# Patient Record
Sex: Female | Born: 1940 | Race: White | Hispanic: No | Smoking: Former smoker
Health system: Southern US, Community
[De-identification: ages and names within clinical notes are randomized; demographics above are authoritative.]

## PROBLEM LIST (undated history)

## (undated) DIAGNOSIS — G454 Transient global amnesia: Secondary | ICD-10-CM

## (undated) DIAGNOSIS — G43909 Migraine, unspecified, not intractable, without status migrainosus: Secondary | ICD-10-CM

## (undated) DIAGNOSIS — S83289A Other tear of lateral meniscus, current injury, unspecified knee, initial encounter: Secondary | ICD-10-CM

## (undated) DIAGNOSIS — M1990 Unspecified osteoarthritis, unspecified site: Secondary | ICD-10-CM

## (undated) DIAGNOSIS — Z87891 Personal history of nicotine dependence: Secondary | ICD-10-CM

## (undated) DIAGNOSIS — R7309 Other abnormal glucose: Secondary | ICD-10-CM

## (undated) DIAGNOSIS — E079 Disorder of thyroid, unspecified: Secondary | ICD-10-CM

## (undated) DIAGNOSIS — K5792 Diverticulitis of intestine, part unspecified, without perforation or abscess without bleeding: Secondary | ICD-10-CM

## (undated) DIAGNOSIS — M8580 Other specified disorders of bone density and structure, unspecified site: Secondary | ICD-10-CM

## (undated) DIAGNOSIS — I10 Essential (primary) hypertension: Secondary | ICD-10-CM

## (undated) HISTORY — PX: DILATION AND CURETTAGE OF UTERUS: SHX78

## (undated) HISTORY — DX: Personal history of nicotine dependence: Z87.891

## (undated) HISTORY — DX: Other abnormal glucose: R73.09

## (undated) HISTORY — DX: Diverticulitis of intestine, part unspecified, without perforation or abscess without bleeding: K57.92

## (undated) HISTORY — DX: Transient global amnesia: G45.4

## (undated) HISTORY — DX: Migraine, unspecified, not intractable, without status migrainosus: G43.909

## (undated) HISTORY — DX: Other tear of lateral meniscus, current injury, unspecified knee, initial encounter: S83.289A

## (undated) HISTORY — DX: Unspecified osteoarthritis, unspecified site: M19.90

## (undated) HISTORY — DX: Other specified disorders of bone density and structure, unspecified site: M85.80

## (undated) HISTORY — PX: TUBAL LIGATION: SHX77

---

## 1998-08-27 ENCOUNTER — Other Ambulatory Visit: Admission: RE | Admit: 1998-08-27 | Discharge: 1998-08-27 | Payer: Self-pay | Admitting: *Deleted

## 1999-09-27 ENCOUNTER — Encounter: Payer: Self-pay | Admitting: Family Medicine

## 1999-09-27 ENCOUNTER — Encounter: Admission: RE | Admit: 1999-09-27 | Discharge: 1999-09-27 | Payer: Self-pay | Admitting: Family Medicine

## 1999-10-04 ENCOUNTER — Encounter: Payer: Self-pay | Admitting: Obstetrics & Gynecology

## 1999-10-04 ENCOUNTER — Encounter: Admission: RE | Admit: 1999-10-04 | Discharge: 1999-10-04 | Payer: Self-pay | Admitting: Obstetrics & Gynecology

## 1999-10-13 ENCOUNTER — Other Ambulatory Visit: Admission: RE | Admit: 1999-10-13 | Discharge: 1999-10-13 | Payer: Self-pay | Admitting: *Deleted

## 1999-10-17 ENCOUNTER — Other Ambulatory Visit: Admission: RE | Admit: 1999-10-17 | Discharge: 1999-10-17 | Payer: Self-pay | Admitting: *Deleted

## 2000-03-28 ENCOUNTER — Encounter: Admission: RE | Admit: 2000-03-28 | Discharge: 2000-03-28 | Payer: Self-pay | Admitting: Family Medicine

## 2000-03-28 ENCOUNTER — Encounter: Payer: Self-pay | Admitting: Family Medicine

## 2000-06-14 ENCOUNTER — Ambulatory Visit (HOSPITAL_COMMUNITY): Admission: RE | Admit: 2000-06-14 | Discharge: 2000-06-14 | Payer: Self-pay | Admitting: Gastroenterology

## 2000-10-04 ENCOUNTER — Encounter: Admission: RE | Admit: 2000-10-04 | Discharge: 2000-10-04 | Payer: Self-pay | Admitting: Family Medicine

## 2000-10-04 ENCOUNTER — Encounter: Payer: Self-pay | Admitting: Family Medicine

## 2001-02-12 ENCOUNTER — Other Ambulatory Visit: Admission: RE | Admit: 2001-02-12 | Discharge: 2001-02-12 | Payer: Self-pay | Admitting: Obstetrics and Gynecology

## 2001-10-17 ENCOUNTER — Encounter: Payer: Self-pay | Admitting: Family Medicine

## 2001-10-17 ENCOUNTER — Encounter: Admission: RE | Admit: 2001-10-17 | Discharge: 2001-10-17 | Payer: Self-pay | Admitting: Family Medicine

## 2001-11-05 ENCOUNTER — Encounter: Admission: RE | Admit: 2001-11-05 | Discharge: 2001-11-21 | Payer: Self-pay | Admitting: Family Medicine

## 2001-12-09 ENCOUNTER — Encounter: Payer: Self-pay | Admitting: Family Medicine

## 2001-12-09 ENCOUNTER — Encounter: Admission: RE | Admit: 2001-12-09 | Discharge: 2001-12-09 | Payer: Self-pay | Admitting: Family Medicine

## 2002-12-02 ENCOUNTER — Encounter: Admission: RE | Admit: 2002-12-02 | Discharge: 2002-12-02 | Payer: Self-pay | Admitting: Family Medicine

## 2003-01-07 ENCOUNTER — Encounter: Admission: RE | Admit: 2003-01-07 | Discharge: 2003-01-07 | Payer: Self-pay | Admitting: Family Medicine

## 2003-01-29 ENCOUNTER — Emergency Department (HOSPITAL_COMMUNITY): Admission: AD | Admit: 2003-01-29 | Discharge: 2003-01-29 | Payer: Self-pay | Admitting: Family Medicine

## 2003-02-27 ENCOUNTER — Other Ambulatory Visit: Admission: RE | Admit: 2003-02-27 | Discharge: 2003-02-27 | Payer: Self-pay | Admitting: Obstetrics and Gynecology

## 2003-09-11 ENCOUNTER — Emergency Department (HOSPITAL_COMMUNITY): Admission: EM | Admit: 2003-09-11 | Discharge: 2003-09-11 | Payer: Self-pay | Admitting: Family Medicine

## 2003-12-07 ENCOUNTER — Encounter: Admission: RE | Admit: 2003-12-07 | Discharge: 2003-12-07 | Payer: Self-pay | Admitting: Family Medicine

## 2004-04-11 ENCOUNTER — Other Ambulatory Visit: Admission: RE | Admit: 2004-04-11 | Discharge: 2004-04-11 | Payer: Self-pay | Admitting: Obstetrics and Gynecology

## 2004-07-17 ENCOUNTER — Emergency Department (HOSPITAL_COMMUNITY): Admission: AD | Admit: 2004-07-17 | Discharge: 2004-07-17 | Payer: Self-pay | Admitting: Emergency Medicine

## 2004-09-07 ENCOUNTER — Ambulatory Visit (HOSPITAL_COMMUNITY): Admission: RE | Admit: 2004-09-07 | Discharge: 2004-09-07 | Payer: Self-pay | Admitting: Orthopedic Surgery

## 2004-11-06 HISTORY — PX: KNEE ARTHROSCOPY: SHX127

## 2004-11-17 ENCOUNTER — Ambulatory Visit (HOSPITAL_COMMUNITY): Admission: RE | Admit: 2004-11-17 | Discharge: 2004-11-17 | Payer: Self-pay | Admitting: Orthopedic Surgery

## 2004-11-17 ENCOUNTER — Ambulatory Visit (HOSPITAL_BASED_OUTPATIENT_CLINIC_OR_DEPARTMENT_OTHER): Admission: RE | Admit: 2004-11-17 | Discharge: 2004-11-17 | Payer: Self-pay | Admitting: Orthopedic Surgery

## 2005-01-23 ENCOUNTER — Encounter: Admission: RE | Admit: 2005-01-23 | Discharge: 2005-01-23 | Payer: Self-pay | Admitting: Family Medicine

## 2005-01-24 ENCOUNTER — Encounter: Admission: RE | Admit: 2005-01-24 | Discharge: 2005-02-07 | Payer: Self-pay | Admitting: Orthopedic Surgery

## 2005-02-22 ENCOUNTER — Emergency Department (HOSPITAL_COMMUNITY): Admission: EM | Admit: 2005-02-22 | Discharge: 2005-02-22 | Payer: Self-pay | Admitting: Family Medicine

## 2005-03-11 ENCOUNTER — Emergency Department (HOSPITAL_COMMUNITY): Admission: EM | Admit: 2005-03-11 | Discharge: 2005-03-11 | Payer: Self-pay | Admitting: Emergency Medicine

## 2005-03-27 ENCOUNTER — Encounter (HOSPITAL_COMMUNITY): Admission: RE | Admit: 2005-03-27 | Discharge: 2005-06-25 | Payer: Self-pay | Admitting: Neurology

## 2005-04-13 ENCOUNTER — Other Ambulatory Visit: Admission: RE | Admit: 2005-04-13 | Discharge: 2005-04-13 | Payer: Self-pay | Admitting: Obstetrics and Gynecology

## 2005-09-28 ENCOUNTER — Emergency Department (HOSPITAL_COMMUNITY): Admission: EM | Admit: 2005-09-28 | Discharge: 2005-09-28 | Payer: Self-pay | Admitting: Family Medicine

## 2006-02-06 ENCOUNTER — Emergency Department (HOSPITAL_COMMUNITY): Admission: EM | Admit: 2006-02-06 | Discharge: 2006-02-06 | Payer: Self-pay | Admitting: Emergency Medicine

## 2006-07-30 ENCOUNTER — Other Ambulatory Visit: Admission: RE | Admit: 2006-07-30 | Discharge: 2006-07-30 | Payer: Self-pay | Admitting: Obstetrics & Gynecology

## 2006-08-08 ENCOUNTER — Encounter: Admission: RE | Admit: 2006-08-08 | Discharge: 2006-08-08 | Payer: Self-pay | Admitting: Obstetrics and Gynecology

## 2007-08-06 ENCOUNTER — Other Ambulatory Visit: Admission: RE | Admit: 2007-08-06 | Discharge: 2007-08-06 | Payer: Self-pay | Admitting: Obstetrics & Gynecology

## 2007-08-21 ENCOUNTER — Encounter: Admission: RE | Admit: 2007-08-21 | Discharge: 2007-08-21 | Payer: Self-pay | Admitting: Family Medicine

## 2008-09-10 ENCOUNTER — Encounter: Admission: RE | Admit: 2008-09-10 | Discharge: 2008-09-10 | Payer: Self-pay | Admitting: Family Medicine

## 2009-04-17 ENCOUNTER — Emergency Department (HOSPITAL_COMMUNITY): Admission: EM | Admit: 2009-04-17 | Discharge: 2009-04-17 | Payer: Self-pay | Admitting: Family Medicine

## 2009-09-28 ENCOUNTER — Encounter: Admission: RE | Admit: 2009-09-28 | Discharge: 2009-09-28 | Payer: Self-pay | Admitting: Family Medicine

## 2010-06-24 NOTE — Procedures (Signed)
EEG NUMBER:  [id number]   REFERRING PHYSICIAN:  Santina Evans A. Orlin Hilding, M.D.   CLINICAL HISTORY:  [age]-year-old lady being evaluated for episode of  global amnesia.   MEDICATIONS:  Medications are not listed.   TECHNICAL DESCRIPTION:  This is a routine 17-channel EEG recorded with the  patient in wakeful and drowsy states.   Background awake rhythm consists of 11- to 12-Hz alpha, which is of moderate  amplitude, synchronous, reactive to eye-opening and closure.  No paroxysmal  epileptiform activity, spikes or sharp waves are seen.  Hyperventilation and  photic stimulation are unremarkable.  Mild drowsiness changes are noted  along with a minimum amount of light sleep which does not show any  abnormalities.  Technical component of study is adequate.  EKG tracing  reveals regular sinus rhythm.  Length of the tracing is 25.6 minutes.   IMPRESSION:  This EEG performed during wakeful states and light sleep is  within normal limits.  No definite epileptiform activity is identified.           ______________________________  Sunny Schlein. Pearlean Brownie, MD     EAV:[id number]  D:  03/27/2005 18:54:00  T:  03/28/2005 10:37:36  Job #:  [id number]   cc:   Santina Evans A. Orlin Hilding, M.D.  Fax: [phone]

## 2010-06-24 NOTE — Op Note (Signed)
[name], [name]              ACCOUNT NO.:  [id number]   MEDICAL RECORD NO.:  [id number]          PATIENT TYPE:  AMB   LOCATION:  [facility]                         FACILITY:  [facility]   PHYSICIAN:  Ollen Gross, M.D.    DATE OF BIRTH:  04/08/1940   DATE OF PROCEDURE:  11/17/2004  DATE OF DISCHARGE:                                 OPERATIVE REPORT   PREOPERATIVE DIAGNOSIS:  Right knee medial meniscal tear and chondral  defect.   POSTOPERATIVE DIAGNOSIS:  Right knee medial meniscal tear and chondral  defect.   PROCEDURE:  Right knee arthroscopy with debridement and chondroplasty medial  femoral condyle and trochlea.   SURGEON:  Ollen Gross, M.D.   ANESTHESIA:  Local with MAC.   ESTIMATED BLOOD LOSS:  Minimal.   DRAINS:  None.   COMPLICATIONS:  Stable to recovery.   CLINICAL NOTE:  [name] is a 70 year old female with significant medial  sided pain, right knee with mechanical symptoms.  Exam and history suggest a  meniscal tear, confirmed by MRI.  She presents now for arthroscopy and  debridement.   PROCEDURE IN DETAIL:  After successful administration of MAC anesthetic, a  tourniquet was placed high on the right thigh.  The right lower extremity  was prepped and draped in the usual sterile fashion. Standard superomedial  and inferolateral cannula sites were marked. The superomedial was not  injected preoperatively thus I injected with 10 cc of 1% Xylocaine. O then  made the incision through the superomedial and inferolateral portals.  Inflow cannula was passed superomedial and camera passed inferolateral.  Arthroscopic visualization proceeds.  Undersurface of patella looks normal.  The trochlea centrally has about a 1 x 1 cm defect with actively  delaminating cartilage.  The rest of the trochlea looks normal.  Medial and  lateral gutters were visualized.  There is a small loose body in the medial  gutter. The spinal needle is used to localize the inferomedial portals.   A  small  incision made and the dilator placed and the shaver passed to remove  that small loose body. Flexion and valgus force was then applied and the  knee medial compartment was entered.  She has evidence of degenerative tear  in the body and posterior horn in the medial meniscus which appears to be  unstable.  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 It is an area about 2 x 2 cm. Baskets and 4.2  mm shaver used debride the meniscus back to stable base.  A shaver was also  used to perform chondroplasty and smooth out the fraying cartilage from the  medial femoral condyle.  Again, the area was about 2 x 2 cm.  This tiny  focal area where it is  debrided all the back to bone with the most part  there is still a cartilaginous cover.  The intercondylar notch was  visualized.  The ACL appears normal.  Lateral compartment centered and is  normal.  The area on the trochlea which is about 1 x 1 cm and appears to  have chondral delamination indeed does have delamination, and the defect is  debrided back to a stable bony base with stable cartilaginous edges.  It is  probed and found to be stable.  The arthroscopic equipment was subsequently  removed from the inferior portals which were closed with interrupted 4-0  nylon and 20 cc of  0.25% Marcaine with epinephrine injected through the inflow cannula and that  is removed and that portal closed with nylon.  Bulky sterile dressing  applied, and she is subsequently awakened and transferred to recovery in  stable condition.      Ollen Gross, M.D.  Electronically Signed     FA/MEDQ  D:  11/17/2004  T:  11/17/2004  Job:  [id number]

## 2010-07-15 ENCOUNTER — Ambulatory Visit (HOSPITAL_COMMUNITY)
Admission: RE | Admit: 2010-07-15 | Discharge: 2010-07-15 | Disposition: A | Discharge status: Home / Self Care | Payer: 59 | Source: Ambulatory Visit | Attending: Gastroenterology | Admitting: Gastroenterology

## 2010-07-15 DIAGNOSIS — K5730 Diverticulosis of large intestine without perforation or abscess without bleeding: Secondary | ICD-10-CM | POA: Insufficient documentation

## 2010-07-15 DIAGNOSIS — E039 Hypothyroidism, unspecified: Secondary | ICD-10-CM | POA: Insufficient documentation

## 2010-07-15 DIAGNOSIS — Z7982 Long term (current) use of aspirin: Secondary | ICD-10-CM | POA: Insufficient documentation

## 2010-07-15 DIAGNOSIS — K625 Hemorrhage of anus and rectum: Secondary | ICD-10-CM | POA: Insufficient documentation

## 2010-07-15 DIAGNOSIS — I10 Essential (primary) hypertension: Secondary | ICD-10-CM | POA: Insufficient documentation

## 2010-07-15 DIAGNOSIS — K648 Other hemorrhoids: Secondary | ICD-10-CM | POA: Insufficient documentation

## 2010-07-15 DIAGNOSIS — Z79899 Other long term (current) drug therapy: Secondary | ICD-10-CM | POA: Insufficient documentation

## 2010-07-15 LAB — HM COLONOSCOPY: HM Colonoscopy: NORMAL

## 2010-10-05 ENCOUNTER — Other Ambulatory Visit: Payer: Self-pay | Admitting: Family Medicine

## 2010-10-05 DIAGNOSIS — Z1231 Encounter for screening mammogram for malignant neoplasm of breast: Secondary | ICD-10-CM

## 2010-10-18 ENCOUNTER — Ambulatory Visit
Admission: RE | Admit: 2010-10-18 | Discharge: 2010-10-18 | Disposition: A | Discharge status: Home / Self Care | Payer: 59 | Source: Ambulatory Visit | Attending: Family Medicine | Admitting: Family Medicine

## 2010-10-18 DIAGNOSIS — Z1231 Encounter for screening mammogram for malignant neoplasm of breast: Secondary | ICD-10-CM

## 2011-04-05 ENCOUNTER — Encounter (HOSPITAL_COMMUNITY): Payer: Self-pay

## 2011-04-05 ENCOUNTER — Emergency Department (INDEPENDENT_AMBULATORY_CARE_PROVIDER_SITE_OTHER)
Admission: EM | Admit: 2011-04-05 | Discharge: 2011-04-05 | Disposition: A | Discharge status: Home Health | Payer: 59 | Source: Home / Self Care | Attending: Family Medicine | Admitting: Family Medicine

## 2011-04-05 DIAGNOSIS — R05 Cough: Secondary | ICD-10-CM

## 2011-04-05 DIAGNOSIS — R059 Cough, unspecified: Secondary | ICD-10-CM

## 2011-04-05 HISTORY — DX: Disorder of thyroid, unspecified: E07.9

## 2011-04-05 HISTORY — DX: Essential (primary) hypertension: I10

## 2011-04-05 MED ORDER — HYDROCOD POLST-CHLORPHEN POLST 10-8 MG/5ML PO LQCR: 5.0000 mL | Freq: Two times a day (BID) | ORAL | Status: DC | PRN

## 2011-04-05 MED ORDER — PREDNISONE (PAK) 10 MG PO TABS: ORAL_TABLET | ORAL | Status: AC

## 2011-04-05 NOTE — Discharge Instructions (Signed)
Take medications as directed. Use the cough syrup as directed. Return to care should your symptoms not improve, or worsen in any way.

## 2011-04-05 NOTE — ED Notes (Signed)
Pt c/o cough since early January.  Pt states onset after using Kaboom aerosol spray while cleaning.  Pt states cough has been productive, clear mucous.  Denies fever.

## 2011-04-05 NOTE — ED Provider Notes (Signed)
History     CSN: [id number]  Arrival date & time 04/05/11  1544   First MD Initiated Contact with Patient 04/05/11 1608      Chief Complaint  Patient presents with  . Cough    (Consider location/radiation/quality/duration/timing/severity/associated sxs/prior treatment) HPI Comments: [name] presents for evaluation of persistent cough since first week of January. She reports at that time. She was using a household cleaner in a poorly ventilated area. She reported nonproductive cough since that time. She denies any fever, shortness of breath or other URI symptoms.  Patient is a 71 y.o. female presenting with cough. The history is provided by the patient.  Cough This is a new problem. The current episode started more than 1 week ago. The problem occurs constantly. The problem has not changed since onset.The cough is non-productive. There has been no fever. Pertinent negatives include no rhinorrhea, no shortness of breath and no wheezing. She has tried cough syrup for the symptoms. The treatment provided no relief. She is not a smoker.    Past Medical History  Diagnosis Date  . Hypertension   . Thyroid disease     History reviewed. No pertinent past surgical history.  History reviewed. No pertinent family history.  History  Substance Use Topics  . Smoking status: Not on file  . Smokeless tobacco: Not on file  . Alcohol Use: No    OB History    Grav Para Term Preterm Abortions TAB SAB Ect Mult Living                  Review of Systems  Constitutional: Negative.   HENT: Negative.  Negative for rhinorrhea.   Eyes: Negative.   Respiratory: Positive for cough. Negative for shortness of breath and wheezing.   Cardiovascular: Negative.   Gastrointestinal: Negative.   Genitourinary: Negative.   Musculoskeletal: Negative.   Skin: Negative.   Neurological: Negative.     Allergies  Hydrocodone and Sulfa antibiotics  Home Medications   Current Outpatient Rx  Name Route  Sig Dispense Refill  . ASPIRIN 325 MG PO TBEC Oral Take 325 mg by mouth daily.    Marland Kitchen BENAZEPRIL-HYDROCHLOROTHIAZIDE 10-12.5 MG PO TABS Oral Take 1 tablet by mouth daily.    Marland Kitchen LEVOTHYROXINE SODIUM 25 MCG PO TABS Oral Take 25 mcg by mouth daily.    Marland Kitchen PROMETHAZINE HCL 25 MG PO TABS Oral Take 25 mg by mouth every 6 (six) hours as needed.    Marland Kitchen RIZATRIPTAN BENZOATE 10 MG PO TABS Oral Take 10 mg by mouth as needed. May repeat in 2 hours if needed    . HYDROCOD POLST-CPM POLST ER 10-8 MG/5ML PO LQCR Oral Take 5 mLs by mouth every 12 (twelve) hours as needed. 140 mL 0  . PREDNISONE (PAK) 10 MG PO TABS  Take 6 tablets on day 1, 5 tablets on day 2, 4 tablets on day 3, 3 tablets on day 4, 2 tablets on day 5, 1 tablet on day 6 21 tablet 0    BP 118/70  Pulse 86  Temp(Src) 99.2 F (37.3 C) (Oral)  Resp 16  SpO2 96%  Physical Exam  Nursing note and vitals reviewed. Constitutional: She is oriented to person, place, and time. She appears well-developed and well-nourished.  HENT:  Head: Normocephalic and atraumatic.  Right Ear: Tympanic membrane normal.  Left Ear: Tympanic membrane normal.  Eyes: EOM are normal.  Neck: Normal range of motion.  Pulmonary/Chest: Effort normal and breath sounds normal. She has no  decreased breath sounds. She has no wheezes. She has no rhonchi. She has no rales.  Musculoskeletal: Normal range of motion.  Neurological: She is alert and oriented to person, place, and time.  Skin: Skin is warm and dry.  Psychiatric: Her behavior is normal.    ED Course  Procedures (including critical care time)  Labs Reviewed - No data to display No results found.   1. Cough       MDM  Exam unremarkable; unclear etiology of persistent cough; will prescribe prednisone dosepak; Tussionex Pennkinetic syrup; return if symptoms persist or worsen        Richardo Priest, MD 04/05/11 1659

## 2011-09-14 ENCOUNTER — Other Ambulatory Visit: Payer: Self-pay | Admitting: Family Medicine

## 2011-09-14 DIAGNOSIS — Z1231 Encounter for screening mammogram for malignant neoplasm of breast: Secondary | ICD-10-CM

## 2011-10-31 ENCOUNTER — Ambulatory Visit: Payer: 59

## 2011-10-31 ENCOUNTER — Ambulatory Visit
Admission: RE | Admit: 2011-10-31 | Discharge: 2011-10-31 | Disposition: A | Discharge status: Home / Self Care | Payer: 59 | Source: Ambulatory Visit | Attending: Family Medicine | Admitting: Family Medicine

## 2011-10-31 DIAGNOSIS — Z1231 Encounter for screening mammogram for malignant neoplasm of breast: Secondary | ICD-10-CM

## 2012-09-30 ENCOUNTER — Other Ambulatory Visit: Payer: Self-pay | Admitting: Nurse Practitioner

## 2012-10-01 ENCOUNTER — Other Ambulatory Visit: Payer: Self-pay | Admitting: Nurse Practitioner

## 2012-10-01 NOTE — Telephone Encounter (Signed)
Patient is not quite due for AEX just yet last AEX 01/01/12 # 24/0rf's sent through to last pt until 11/14

## 2012-10-02 ENCOUNTER — Other Ambulatory Visit: Payer: Self-pay | Admitting: Nurse Practitioner

## 2012-12-20 ENCOUNTER — Other Ambulatory Visit: Payer: Self-pay

## 2012-12-20 DIAGNOSIS — Z1231 Encounter for screening mammogram for malignant neoplasm of breast: Secondary | ICD-10-CM

## 2013-01-01 ENCOUNTER — Other Ambulatory Visit: Payer: Self-pay | Admitting: Nurse Practitioner

## 2013-01-01 NOTE — Telephone Encounter (Signed)
eScribe request for refill on VITAMIN D 78295 Last filled - 01/01/12 Last AEX - 01/01/12 Next AEX - not scheduled Last Vitamin D - 02/08/12 = 42 I spoke to patient.  She is scheduled for 12/8 for AEX.  RX sent.

## 2013-01-13 ENCOUNTER — Ambulatory Visit (INDEPENDENT_AMBULATORY_CARE_PROVIDER_SITE_OTHER): Payer: Medicare Other | Admitting: Nurse Practitioner

## 2013-01-13 ENCOUNTER — Encounter: Payer: Self-pay | Admitting: Nurse Practitioner

## 2013-01-13 VITALS — BP 130/64 | HR 64 | Resp 16 | Ht 64.75 in | Wt 148.0 lb

## 2013-01-13 DIAGNOSIS — Z01419 Encounter for gynecological examination (general) (routine) without abnormal findings: Secondary | ICD-10-CM

## 2013-01-13 DIAGNOSIS — E559 Vitamin D deficiency, unspecified: Secondary | ICD-10-CM

## 2013-01-13 MED ORDER — VITAMIN D (ERGOCALCIFEROL) 1.25 MG (50000 UNIT) PO CAPS: ORAL_CAPSULE | ORAL | Status: DC

## 2013-01-13 NOTE — Patient Instructions (Addendum)

## 2013-01-13 NOTE — Progress Notes (Signed)
Patient ID: [name], female   DOB: 1940-10-01, 72 y.o.   MRN: [id number] 72 y.o. G0P0 Single Caucasian Fe here for annual exam.  Now retired since January 2014 and very busy.   No new health issues.  No LMP recorded. Patient is postmenopausal.          Sexually active: no  The current method of family planning is post menopausal status.    Exercising: yes  walking, stretching Smoker:  former  Health Maintenance: Pap:  12/02/09, WNL MMG:  11/01/11, Bi-Rads 1: negative Colonoscopy:  07/15/10, normal, repeat in 10 years BMD:   2008, "normal" TDaP:  2007 Labs: PCP   reports that she has quit smoking. She has never used smokeless tobacco. She reports that she drinks alcohol. She reports that she does not use illicit drugs.  Past Medical History  Diagnosis Date  . Hypertension   . TGA (transient global amnesia)     secondary to [place]  . Former smoker   . Diverticulitis   . Migraines   . Osteoarthritis     Past Surgical History  Procedure Laterality Date  . Tubal ligation    . Dilation and curettage of uterus      menorrhagia  . Knee arthroscopy Right 10/06    Current Outpatient Prescriptions  Medication Sig Dispense Refill  . benazepril-hydrochlorthiazide (LOTENSIN HCT) 10-12.5 MG per tablet Take 1 tablet by mouth daily.      Marland Kitchen levothyroxine (SYNTHROID, LEVOTHROID) 25 MCG tablet Take 25 mcg by mouth daily before breakfast.      . promethazine (PHENERGAN) 25 MG tablet Take 25 mg by mouth every 6 (six) hours as needed for nausea or vomiting.      . rizatriptan (MAXALT-MLT) 10 MG disintegrating tablet Take 10 mg by mouth as needed for migraine. May repeat in 2 hours if needed      . Vitamin D, Ergocalciferol, (DRISDOL) 50000 UNITS CAPS capsule TAKE 1 CAPSULE BY MOUTH ONCE WEEKLY.  30 capsule  0   No current facility-administered medications for this visit.    Family History  Problem Relation Age of Onset  . Heart disease Mother   . Diabetes Father   . Heart failure  Father   . COPD Father   . Hyperlipidemia Sister   . Diabetes Brother     ROS:  Pertinent items are noted in HPI.  Otherwise, a comprehensive ROS was negative.  Exam:   BP 130/64  Pulse 64  Resp 16  Ht 5' 4.75" (1.645 m)  Wt 148 lb (67.132 kg)  BMI 24.81 kg/m2 Height: 5' 4.75" (164.5 cm)  Ht Readings from Last 3 Encounters:  01/13/13 5' 4.75" (1.645 m)    General appearance: alert, cooperative and appears stated age Head: Normocephalic, without obvious abnormality, atraumatic Neck: no adenopathy, supple, symmetrical, trachea midline and thyroid normal to inspection and palpation Lungs: clear to auscultation bilaterally Breasts: normal appearance, no masses or tenderness Heart: regular rate and rhythm Abdomen: soft, non-tender; no masses,  no organomegaly Extremities: extremities normal, atraumatic, no cyanosis or edema Skin: Skin color, texture, turgor normal. No rashes or lesions Lymph nodes: Cervical, supraclavicular, and axillary nodes normal. No abnormal inguinal nodes palpated Neurologic: Grossly normal   Pelvic: External genitalia:  no lesions              Urethra:  normal appearing urethra with no masses, tenderness or lesions              Bartholin's and Skene's: normal  Vagina: atrophic appearing vagina with pale color and discharge, no lesions              Cervix: anteverted              Pap taken: no Bimanual Exam:  Uterus:  normal size, contour, position, consistency, mobility, non-tender              Adnexa: no mass, fullness, tenderness               Rectovaginal: Confirms               Anus:  normal sphincter tone, no lesions  A:  Well Woman with normal exam  Postmenopausal no HRT  Atrophic vaginitis - can not afford Vagifem.  P:   Pap smear as per guidelines   Mammogram due now and is scheduled  Counseled on breast self exam, mammography screening, adequate intake of calcium and vitamin D, diet and exercise, Kegel's exercises return  annually or prn  An After Visit Summary was printed and given to the patient.

## 2013-01-14 ENCOUNTER — Encounter: Payer: Self-pay | Admitting: Nurse Practitioner

## 2013-01-14 LAB — VITAMIN D 25 HYDROXY (VIT D DEFICIENCY, FRACTURES): Vit D, 25-Hydroxy: 50 ng/mL (ref 30–89)

## 2013-01-15 ENCOUNTER — Ambulatory Visit: Payer: Self-pay | Admitting: Nurse Practitioner

## 2013-01-17 NOTE — Progress Notes (Signed)
Encounter reviewed by Dr. Devaunte Gasparini Silva.  

## 2013-01-22 ENCOUNTER — Ambulatory Visit
Admission: RE | Admit: 2013-01-22 | Discharge: 2013-01-22 | Disposition: A | Discharge status: Home / Self Care | Payer: Medicare Other | Source: Ambulatory Visit

## 2013-01-22 DIAGNOSIS — Z1231 Encounter for screening mammogram for malignant neoplasm of breast: Secondary | ICD-10-CM

## 2013-11-17 ENCOUNTER — Ambulatory Visit: Payer: Self-pay | Admitting: Nurse Practitioner

## 2013-12-23 ENCOUNTER — Telehealth: Payer: Self-pay | Admitting: Nurse Practitioner

## 2013-12-23 NOTE — Telephone Encounter (Signed)
Pt calling to get names of skin doctors from [place]. Ok to leave a detailed message if she is not home.

## 2013-12-23 NOTE — Telephone Encounter (Signed)
Detailed message left per patient request.  Advised Nancy FranklinPatricia Rolen-Grubb, FNP typically refers to Dr. Luvenia StarchGrubers office. Number provider.  Also advised sometimes can be lengthy wait to see Dr. Danella DeisGruber and advised of additional providers at practice.   Routing to provider for final review. Patient agreeable to disposition. Will close encounter

## 2013-12-25 ENCOUNTER — Other Ambulatory Visit: Payer: Self-pay

## 2013-12-25 DIAGNOSIS — Z1231 Encounter for screening mammogram for malignant neoplasm of breast: Secondary | ICD-10-CM

## 2014-01-15 ENCOUNTER — Ambulatory Visit: Payer: Self-pay | Admitting: Nurse Practitioner

## 2014-01-23 ENCOUNTER — Ambulatory Visit
Admission: RE | Admit: 2014-01-23 | Discharge: 2014-01-23 | Disposition: A | Discharge status: Home / Self Care | Payer: Medicare Other | Source: Ambulatory Visit

## 2014-01-23 DIAGNOSIS — Z1231 Encounter for screening mammogram for malignant neoplasm of breast: Secondary | ICD-10-CM

## 2014-01-26 ENCOUNTER — Ambulatory Visit (INDEPENDENT_AMBULATORY_CARE_PROVIDER_SITE_OTHER): Payer: Medicare Other | Admitting: Nurse Practitioner

## 2014-01-26 ENCOUNTER — Encounter: Payer: Self-pay | Admitting: Nurse Practitioner

## 2014-01-26 VITALS — BP 110/66 | HR 76 | Ht 64.25 in | Wt 138.0 lb

## 2014-01-26 DIAGNOSIS — E559 Vitamin D deficiency, unspecified: Secondary | ICD-10-CM

## 2014-01-26 DIAGNOSIS — Z0000 Encounter for general adult medical examination without abnormal findings: Secondary | ICD-10-CM

## 2014-01-26 DIAGNOSIS — Z01419 Encounter for gynecological examination (general) (routine) without abnormal findings: Secondary | ICD-10-CM

## 2014-01-26 MED ORDER — ALPRAZOLAM 0.25 MG PO TABS: 0.2500 mg | ORAL_TABLET | Freq: Every evening | ORAL | Status: DC | PRN

## 2014-01-26 MED ORDER — VITAMIN D (ERGOCALCIFEROL) 1.25 MG (50000 UNIT) PO CAPS: ORAL_CAPSULE | ORAL | Status: DC

## 2014-01-26 NOTE — Progress Notes (Signed)
Patient ID: [name], female   DOB: Oct 17, 1940, 73 y.o.   MRN: [id number] 73 y.o. G0 Divorced Caucasian Fe here for annual exam.  She feels well without new health issues.  She is quite anxious about her brother who has been in the hospital since 12/11/13 with some type of neurological episodes of tremors and seizure like activity.  He has been seen by several specialist without any known cause of the disorder.  He lives across the street from her and generally takes care of her yard or household  needs.  Patient's last menstrual period was 01/19/1992.          Sexually active: no  The current method of family planning is post menopausal status.  Exercising: yes walking, stretching Smoker: former  Health Maintenance: Pap: 12/02/09, WNL MMG: 01/23/14, 3D, Bi-Rads 1:  Negative  Colonoscopy: 07/15/10, normal, repeat in 10 years BMD: 04/25/13, normal per patient, Dr. Massie MaroonE. Griffin TDaP: 2007 Shingles: not had Prevnar 03/2013 Labs: PCP   reports that she has quit smoking. She has never used smokeless tobacco. She reports that she drinks about 0.6 oz of alcohol per week. She reports that she does not use illicit drugs.  Past Medical History  Diagnosis Date  . Thyroid disease   . Hypertension   . TGA (transient global amnesia)     secondary to [place]  . Former smoker   . Diverticulitis   . Migraines   . Osteoarthritis     Past Surgical History  Procedure Laterality Date  . Tubal ligation    . Dilation and curettage of uterus      menorrhagia  . Knee arthroscopy Right 10/06    Current Outpatient Prescriptions  Medication Sig Dispense Refill  . aspirin 325 MG EC tablet Take 325 mg by mouth daily.    . benazepril-hydrochlorthiazide (LOTENSIN HCT) 10-12.5 MG per tablet Take 1 tablet by mouth daily.    Marland Kitchen. levothyroxine (SYNTHROID, LEVOTHROID) 25 MCG tablet Take 25 mcg by mouth daily.    . promethazine (PHENERGAN) 25 MG tablet Take 25 mg by mouth every 6 (six) hours as  needed for nausea or vomiting.    . Vitamin D, Ergocalciferol, (DRISDOL) 50000 UNITS CAPS capsule TAKE 1 CAPSULE BY MOUTH ONCE WEEKLY. 30 capsule 3  . ALPRAZolam (XANAX) 0.25 MG tablet Take 1 tablet (0.25 mg total) by mouth at bedtime as needed for anxiety. 30 tablet 0   No current facility-administered medications for this visit.    Family History  Problem Relation Age of Onset  . Heart disease Mother   . Diabetes Father   . Heart failure Father   . COPD Father   . Hyperlipidemia Sister   . Diabetes Brother     ROS:  Pertinent items are noted in HPI.  Otherwise, a comprehensive ROS was negative.  Exam:   BP 110/66 mmHg  Pulse 76  Ht 5' 4.25" (1.632 m)  Wt 138 lb (62.596 kg)  BMI 23.50 kg/m2  LMP 01/19/1992 Height: 5' 4.25" (163.2 cm)  Ht Readings from Last 3 Encounters:  01/26/14 5' 4.25" (1.632 m)  01/13/13 5' 4.75" (1.645 m)    General appearance: alert, cooperative and appears stated age Head: Normocephalic, without obvious abnormality, atraumatic Neck: no adenopathy, supple, symmetrical, trachea midline and thyroid normal to inspection and palpation Lungs: clear to auscultation bilaterally Breasts: normal appearance, no masses or tenderness Heart: regular rate and rhythm Abdomen: soft, non-tender; no masses,  no organomegaly Extremities: extremities normal, atraumatic, no cyanosis  or edema Skin: Skin color, texture, turgor normal. No rashes or lesions Lymph nodes: Cervical, supraclavicular, and axillary nodes normal. No abnormal inguinal nodes palpated Neurologic: Grossly normal   Pelvic: External genitalia:  no lesions              Urethra:  normal appearing urethra with no masses, tenderness or lesions              Bartholin's and Skene's: normal                 Vagina: normal appearing vagina with normal color and discharge, no lesions              Cervix: anteverted              Pap taken: Yes.   Bimanual Exam:  Uterus:  normal size, contour, position,  consistency, mobility, non-tender              Adnexa: no mass, fullness, tenderness               Rectovaginal: Confirms               Anus:  normal sphincter tone, no lesions  A:  Well Woman with normal exam  Postmenopausal no HRT Atrophic vaginitis - can not afford Vagifem.  Recent family stressors  P:   Reviewed health and wellness pertinent to exam  Pap smear taken today  Mammogram is due 12/16  Refill on Vit D and follow with labs  RX given for Xanax 0.025 mg to take only prn without a refill until she can get in to see PCP next month - know that we will not give another RX.  Counseled on breast self exam, mammography screening, adequate intake of calcium and vitamin D, diet and exercise, Kegel's exercises return annually or prn  An After Visit Summary was printed and given to the patient.

## 2014-01-26 NOTE — Patient Instructions (Signed)

## 2014-01-27 LAB — VITAMIN D 25 HYDROXY (VIT D DEFICIENCY, FRACTURES): Vit D, 25-Hydroxy: 40 ng/mL (ref 30–100)

## 2014-01-28 LAB — IPS PAP SMEAR ONLY

## 2014-02-01 NOTE — Progress Notes (Signed)
Encounter reviewed by Dr. Brook Silva.  

## 2014-12-24 ENCOUNTER — Other Ambulatory Visit: Payer: Self-pay

## 2014-12-24 DIAGNOSIS — Z1231 Encounter for screening mammogram for malignant neoplasm of breast: Secondary | ICD-10-CM

## 2015-01-29 ENCOUNTER — Ambulatory Visit
Admission: RE | Admit: 2015-01-29 | Discharge: 2015-01-29 | Disposition: A | Discharge status: Home / Self Care | Payer: Medicare Other | Source: Ambulatory Visit

## 2015-01-29 ENCOUNTER — Encounter: Payer: Self-pay | Admitting: Nurse Practitioner

## 2015-01-29 ENCOUNTER — Ambulatory Visit (INDEPENDENT_AMBULATORY_CARE_PROVIDER_SITE_OTHER): Payer: Medicare Other | Admitting: Nurse Practitioner

## 2015-01-29 VITALS — BP 130/66 | HR 60 | Ht 64.25 in | Wt 145.0 lb

## 2015-01-29 DIAGNOSIS — Z1231 Encounter for screening mammogram for malignant neoplasm of breast: Secondary | ICD-10-CM

## 2015-01-29 DIAGNOSIS — Z0000 Encounter for general adult medical examination without abnormal findings: Secondary | ICD-10-CM | POA: Diagnosis not present

## 2015-01-29 DIAGNOSIS — Z01419 Encounter for gynecological examination (general) (routine) without abnormal findings: Secondary | ICD-10-CM

## 2015-01-29 DIAGNOSIS — E559 Vitamin D deficiency, unspecified: Secondary | ICD-10-CM

## 2015-01-29 MED ORDER — PROMETHAZINE HCL 25 MG PO TABS: 25.0000 mg | ORAL_TABLET | Freq: Four times a day (QID) | ORAL | Status: DC | PRN

## 2015-01-29 MED ORDER — VITAMIN D (ERGOCALCIFEROL) 1.25 MG (50000 UNIT) PO CAPS: ORAL_CAPSULE | ORAL | Status: DC

## 2015-01-29 NOTE — Progress Notes (Signed)
Patient ID: [name], female   DOB: 06/19/1940, 74 y.o.   MRN: [id number] 74 y.o. G0P0 Divorced  Caucasian Fe here for annual exam.  Doing well with no new health problems.  Brother is doing some better.  Patient's last menstrual period was 01/19/1992 (exact date).          Sexually active: No.  The current method of family planning is abstinence and post menopausal status.    Exercising: Yes.    walking, stretching and doing yard work Smoker:  no  Health Maintenance: Pap:  01/26/14, Negative MMG:  01/23/14, Bi-Rads 1:  Negative, appt today for repeat Colonoscopy:  07/15/10, normal, repeat in 10 years BMD:   04/25/13, per patient normal, Dr. Valentina LucksGriffin TDaP:  2007 Shingles: Never, will check coverage at pharmacy Pneumonia: Prevnar 03/2013 Hep C and HIV: not indicated due to age Labs: PCP, pt brought copies of recent labs most recent Vit D was at 40 at PCP   reports that she has quit smoking. She has never used smokeless tobacco. She reports that she drinks about 0.6 oz of alcohol per week. She reports that she does not use illicit drugs.  Past Medical History  Diagnosis Date  . Thyroid disease   . Hypertension   . TGA (transient global amnesia)     secondary to [place]  . Former smoker   . Diverticulitis   . Migraines   . Osteoarthritis     Past Surgical History  Procedure Laterality Date  . Tubal ligation    . Dilation and curettage of uterus      menorrhagia  . Knee arthroscopy Right 10/06    Current Outpatient Prescriptions  Medication Sig Dispense Refill  . aspirin 325 MG EC tablet Take 325 mg by mouth every other day.     . benazepril-hydrochlorthiazide (LOTENSIN HCT) 10-12.5 MG per tablet Take 1 tablet by mouth daily.    Marland Kitchen. levothyroxine (SYNTHROID, LEVOTHROID) 25 MCG tablet Take 25 mcg by mouth daily.    . Magnesium 400 MG CAPS Take 1 capsule by mouth 2 (two) times a week.    . promethazine (PHENERGAN) 25 MG tablet Take 1 tablet (25 mg total) by mouth every 6  (six) hours as needed for nausea or vomiting. 30 tablet 3  . Vitamin D, Ergocalciferol, (DRISDOL) 50000 UNITS CAPS capsule TAKE 1 CAPSULE BY MOUTH ONCE WEEKLY. 30 capsule 3   No current facility-administered medications for this visit.    Family History  Problem Relation Age of Onset  . Heart disease Mother   . Diabetes Father   . Heart failure Father   . COPD Father   . Hyperlipidemia Sister   . Diabetes Brother     ROS:  Pertinent items are noted in HPI.  Otherwise, a comprehensive ROS was negative.  Exam:   BP 130/66 mmHg  Pulse 60  Ht 5' 4.25" (1.632 m)  Wt 145 lb (65.772 kg)  BMI 24.69 kg/m2  LMP 01/19/1992 (Exact Date) Height: 5' 4.25" (163.2 cm) Ht Readings from Last 3 Encounters:  01/29/15 5' 4.25" (1.632 m)  01/26/14 5' 4.25" (1.632 m)  01/13/13 5' 4.75" (1.645 m)    General appearance: alert, cooperative and appears stated age Head: Normocephalic, without obvious abnormality, atraumatic Neck: no adenopathy, supple, symmetrical, trachea midline and thyroid normal to inspection and palpation Lungs: clear to auscultation bilaterally Breasts: normal appearance, no masses or tenderness Heart: regular rate and rhythm Abdomen: soft, non-tender; no masses,  no organomegaly Extremities: extremities normal,  atraumatic, no cyanosis or edema Skin: Skin color, texture, turgor normal. No rashes or lesions Lymph nodes: Cervical, supraclavicular, and axillary nodes normal. No abnormal inguinal nodes palpated Neurologic: Grossly normal   Pelvic: External genitalia:  no lesions              Urethra:  normal appearing urethra with no masses, tenderness or lesions              Bartholin's and Skene's: normal                 Vagina: atrophic normal appearing vagina with normal color and discharge, no lesions              Cervix: anteverted              Pap taken: No. Bimanual Exam:  Uterus:  normal size, contour, position, consistency, mobility, non-tender               Adnexa: no mass, fullness, tenderness               Rectovaginal: Confirms               Anus:  normal sphincter tone, no lesions  Chaperone present: no  A:  Well Woman with normal exam  Postmenopausal no HRT Atrophic vaginitis - can not afford Vagifem. Recent family stressors - brother some better  Vit D deficiency   P:   Reviewed health and wellness pertinent to exam  Pap smear as above  Mammogram is today  Refill on Vit D and phenergan to use prn HA's  Counseled on breast self exam, mammography screening, adequate intake of calcium and vitamin D, diet and exercise return annually or prn  An After Visit Summary was printed and given to the patient.

## 2015-01-29 NOTE — Patient Instructions (Addendum)

## 2015-02-04 NOTE — Progress Notes (Signed)
Encounter reviewed Jill Jertson, MD   

## 2015-02-07 DIAGNOSIS — S83289A Other tear of lateral meniscus, current injury, unspecified knee, initial encounter: Secondary | ICD-10-CM

## 2015-02-07 HISTORY — DX: Other tear of lateral meniscus, current injury, unspecified knee, initial encounter: S83.289A

## 2016-01-24 ENCOUNTER — Other Ambulatory Visit: Payer: Self-pay | Admitting: Family Medicine

## 2016-01-24 DIAGNOSIS — Z1231 Encounter for screening mammogram for malignant neoplasm of breast: Secondary | ICD-10-CM

## 2016-02-15 ENCOUNTER — Ambulatory Visit (INDEPENDENT_AMBULATORY_CARE_PROVIDER_SITE_OTHER): Payer: Medicare Other | Admitting: Nurse Practitioner

## 2016-02-15 ENCOUNTER — Encounter: Payer: Self-pay | Admitting: Nurse Practitioner

## 2016-02-15 VITALS — BP 120/60 | HR 64 | Ht 64.0 in | Wt 144.0 lb

## 2016-02-15 DIAGNOSIS — Z01411 Encounter for gynecological examination (general) (routine) with abnormal findings: Secondary | ICD-10-CM | POA: Diagnosis not present

## 2016-02-15 DIAGNOSIS — Z0000 Encounter for general adult medical examination without abnormal findings: Secondary | ICD-10-CM

## 2016-02-15 DIAGNOSIS — F439 Reaction to severe stress, unspecified: Secondary | ICD-10-CM

## 2016-02-15 DIAGNOSIS — E039 Hypothyroidism, unspecified: Secondary | ICD-10-CM | POA: Diagnosis not present

## 2016-02-15 DIAGNOSIS — E559 Vitamin D deficiency, unspecified: Secondary | ICD-10-CM

## 2016-02-15 MED ORDER — PROMETHAZINE HCL 25 MG PO TABS: 25.0000 mg | ORAL_TABLET | Freq: Four times a day (QID) | ORAL | 3 refills | Status: DC | PRN

## 2016-02-15 MED ORDER — VITAMIN D (ERGOCALCIFEROL) 1.25 MG (50000 UNIT) PO CAPS: ORAL_CAPSULE | ORAL | 3 refills | Status: DC

## 2016-02-15 NOTE — Patient Instructions (Signed)

## 2016-02-15 NOTE — Progress Notes (Signed)
Patient ID: [name], female   DOB: 07/08/1940, 76 y.o.   MRN: [id number]  76 y.o. G0P0000 Divorced  Caucasian Fe here for annual exam. Taking injections for left knee pain - has a partial medial meniscus tear.   She brings labs from PCP.  She had a high TSH 04/2015 and now dose is increased for Synthroid to 50 mcg.  In 10/2015 TSH was much better.  Vit D was 30 in 04/2015.  Will repeat today and refill Vit D until labs are back.  Patient's last menstrual period was 01/19/1992 (exact date).          Sexually active: No.  The current method of family planning is post menopausal status.    Exercising: Yes.    patient is very active in warmer weather, is not as active due to meniscus tear. Smoker:  no  Health Maintenance: Pap:  01/26/14, Negative MMG: 01/29/15, 3D, Bi-Rads 1:  Negative, scheduled for 02/21/16 Colonoscopy:  07/15/10, normal, repeat in 10 years, neg IFOB in 01/2016 BMD: 05/20/15, per patient normal and no change from 2015, Dr. Valentina LucksGriffin TDaP: 03/03/2005 - will update at PCP Shingles: Never, will check coverage at pharmacy Pneumonia: 03/24/13 Prevnar-13, 03/03/09 Pneumovax Hep C and HIV: not indicated due to age Labs: PCP takes care of all labs, pt brought copies, patient would like Vit D check here   reports that she has quit smoking. She has never used smokeless tobacco. She reports that she drinks about 0.6 oz of alcohol per week . She reports that she does not use drugs.  Past Medical History:  Diagnosis Date  . Diverticulitis   . Former smoker   . Hypertension   . Migraines   . Osteoarthritis   . TGA (transient global amnesia)    secondary to [place]  . Thyroid disease     Past Surgical History:  Procedure Laterality Date  . DILATION AND CURETTAGE OF UTERUS     menorrhagia  . KNEE ARTHROSCOPY Right 10/06  . TUBAL LIGATION      Current Outpatient Prescriptions  Medication Sig Dispense Refill  . aspirin 325 MG EC tablet Take 325 mg by mouth every other day.      . benazepril-hydrochlorthiazide (LOTENSIN HCT) 10-12.5 MG per tablet Take 1 tablet by mouth daily.    Marland Kitchen. levothyroxine (SYNTHROID, LEVOTHROID) 25 MCG tablet Take 25 mcg by mouth daily.    . Magnesium 400 MG CAPS Take 1 capsule by mouth 2 (two) times a week.    . promethazine (PHENERGAN) 25 MG tablet Take 1 tablet (25 mg total) by mouth every 6 (six) hours as needed for nausea or vomiting. 30 tablet 3  . Vitamin D, Ergocalciferol, (DRISDOL) 50000 UNITS CAPS capsule TAKE 1 CAPSULE BY MOUTH ONCE WEEKLY. 30 capsule 3   No current facility-administered medications for this visit.     Family History  Problem Relation Age of Onset  . Heart disease Mother   . Diabetes Father   . Heart failure Father   . COPD Father   . Hyperlipidemia Sister   . Diabetes Brother     ROS:  Pertinent items are noted in HPI.  Otherwise, a comprehensive ROS was negative.  Exam:   LMP 01/19/1992 (Exact Date)    Ht Readings from Last 3 Encounters:  01/29/15 5' 4.25" (1.632 m)  01/26/14 5' 4.25" (1.632 m)  01/13/13 5' 4.75" (1.645 m)    General appearance: alert, cooperative and appears stated age Head: Normocephalic, without obvious abnormality,  atraumatic Neck: no adenopathy, supple, symmetrical, trachea midline and thyroid normal to inspection and palpation Lungs: clear to auscultation bilaterally Breasts: normal appearance, no masses or tenderness Heart: regular rate and rhythm Abdomen: soft, non-tender; no masses,  no organomegaly Extremities: extremities normal, atraumatic, no cyanosis or edema Skin: Skin color, texture, turgor normal. No rashes or lesions Lymph nodes: Cervical, supraclavicular, and axillary nodes normal. No abnormal inguinal nodes palpated Neurologic: Grossly normal   Pelvic: External genitalia:  no lesions              Urethra:  normal appearing urethra with no masses, tenderness or lesions              Bartholin's and Skene's: normal                 Vagina: normal appearing  vagina with normal color and discharge, no lesions              Cervix: anteverted              Pap taken: Yes.   Bimanual Exam:  Uterus:  normal size, contour, position, consistency, mobility, non-tender              Adnexa: no mass, fullness, tenderness               Rectovaginal: Confirms               Anus:  normal sphincter tone, no lesions  Chaperone present: yes  A:  Well Woman with normal exam      Postmenopausal no HRT Atrophic vaginitis - can not afford Vagifem. Recent family stressors - brother some better, did not have a seizure disorder             Vit D deficiency   P:   Reviewed health and wellness pertinent to exam  Pap smear was done  Refill on Vit D and will follow with labs.  Mammogram is due now and is scheduled  Refill on Phenergan that she uses prn for flare of IBS  Counseled on breast self exam, mammography screening, adequate intake of calcium and vitamin D, diet and exercise, Kegel's exercises return annually or prn Labs are sent for scan.  An After Visit Summary was printed and given to the patient.

## 2016-02-16 LAB — VITAMIN D 25 HYDROXY (VIT D DEFICIENCY, FRACTURES): Vit D, 25-Hydroxy: 58 ng/mL (ref 30–100)

## 2016-02-16 LAB — IPS PAP SMEAR ONLY

## 2016-02-20 NOTE — Progress Notes (Signed)
Encounter reviewed by Dr. Janean SarkBrook Amundson C. Silva. Patient may be able to discontinue prescription vitamin D.  If she is having significant IBS symptoms, she will need to return to GI or her PCP.

## 2016-02-21 ENCOUNTER — Ambulatory Visit
Admission: RE | Admit: 2016-02-21 | Discharge: 2016-02-21 | Disposition: A | Discharge status: Home / Self Care | Payer: Medicare Other | Source: Ambulatory Visit | Attending: Family Medicine | Admitting: Family Medicine

## 2016-02-21 DIAGNOSIS — Z1231 Encounter for screening mammogram for malignant neoplasm of breast: Secondary | ICD-10-CM

## 2017-01-23 ENCOUNTER — Other Ambulatory Visit: Payer: Self-pay | Admitting: Family Medicine

## 2017-01-23 DIAGNOSIS — Z1231 Encounter for screening mammogram for malignant neoplasm of breast: Secondary | ICD-10-CM

## 2017-02-19 ENCOUNTER — Ambulatory Visit: Payer: Medicare Other | Admitting: Nurse Practitioner

## 2017-02-19 ENCOUNTER — Ambulatory Visit: Payer: Medicare Other | Admitting: Obstetrics and Gynecology

## 2017-02-21 NOTE — Progress Notes (Deleted)
77 y.o. G57P0000 Divorced Caucasian female here for annual exam.    PCP:     Patient's last menstrual period was 01/19/1992 (exact date).           Sexually active: {yes no:314532}  The current method of family planning is post menopausal status.    Exercising: {yes no:314532}  {types:19826} Smoker:  no  Health Maintenance: Pap: 02-15-16 Neg,  History of abnormal Pap:  {YES NO:22349} MMG: ***Appt. 02-22-17 Colonoscopy:  07/15/10, normal, repeat in 10 years BMD: ***?05-20-15 Result :Osteopenia with [facility] TDaP: ***?PCP  Gardasil:   no HIV:not indicated due to age Hep C: not indicated due to age Screening Labs:  Hb today: ***, Urine today: ***   reports that she has quit smoking. she has never used smokeless tobacco. She reports that she drinks about 0.6 oz of alcohol per week. She reports that she does not use drugs.  Past Medical History:  Diagnosis Date  . Diverticulitis   . Former smoker   . Hypertension   . Lateral meniscus tear 2017   left knee  . Migraines   . Osteoarthritis   . TGA (transient global amnesia)    secondary to [place]  . Thyroid disease     Past Surgical History:  Procedure Laterality Date  . DILATION AND CURETTAGE OF UTERUS     menorrhagia  . KNEE ARTHROSCOPY Right 10/06  . TUBAL LIGATION      Current Outpatient Medications  Medication Sig Dispense Refill  . aspirin 325 MG EC tablet Take 325 mg by mouth every other day.     . benazepril-hydrochlorthiazide (LOTENSIN HCT) 10-12.5 MG per tablet Take 1 tablet by mouth daily.    . Calcium 500-100 MG-UNIT CHEW Chew 2 tablets by mouth daily.    Marland Kitchen levothyroxine (SYNTHROID, LEVOTHROID) 50 MCG tablet Take 1 tablet by mouth daily.    . promethazine (PHENERGAN) 25 MG tablet Take 1 tablet (25 mg total) by mouth every 6 (six) hours as needed for nausea or vomiting. 30 tablet 3  . Vitamin D, Ergocalciferol, (DRISDOL) 50000 units CAPS capsule TAKE 1 CAPSULE BY MOUTH ONCE WEEKLY. 30 capsule 3   No current  facility-administered medications for this visit.     Family History  Problem Relation Age of Onset  . Heart disease Mother   . Diabetes Father   . Heart failure Father   . COPD Father   . Hyperlipidemia Sister   . Diabetes Brother     ROS:  Pertinent items are noted in HPI.  Otherwise, a comprehensive ROS was negative.  Exam:   LMP 01/19/1992 (Exact Date)     General appearance: alert, cooperative and appears stated age Head: Normocephalic, without obvious abnormality, atraumatic Neck: no adenopathy, supple, symmetrical, trachea midline and thyroid normal to inspection and palpation Lungs: clear to auscultation bilaterally Breasts: normal appearance, no masses or tenderness, No nipple retraction or dimpling, No nipple discharge or bleeding, No axillary or supraclavicular adenopathy Heart: regular rate and rhythm Abdomen: soft, non-tender; no masses, no organomegaly Extremities: extremities normal, atraumatic, no cyanosis or edema Skin: Skin color, texture, turgor normal. No rashes or lesions Lymph nodes: Cervical, supraclavicular, and axillary nodes normal. No abnormal inguinal nodes palpated Neurologic: Grossly normal  Pelvic: External genitalia:  no lesions              Urethra:  normal appearing urethra with no masses, tenderness or lesions              Bartholins and Skenes:  normal                 Vagina: normal appearing vagina with normal color and discharge, no lesions              Cervix: no lesions              Pap taken: {yes no:314532} Bimanual Exam:  Uterus:  normal size, contour, position, consistency, mobility, non-tender              Adnexa: no mass, fullness, tenderness              Rectal exam: {yes no:314532}.  Confirms.              Anus:  normal sphincter tone, no lesions  Chaperone was present for exam.  Assessment:   Well woman visit with normal exam.   Plan: Mammogram screening discussed. Recommended self breast awareness. Pap and HR HPV as  above. Guidelines for Calcium, Vitamin D, regular exercise program including cardiovascular and weight bearing exercise.   Follow up annually and prn.   Additional counseling given.  {yes [id number]:314532}. _______ minutes face to face time of which over 50% was spent in counseling.    After visit summary provided.

## 2017-02-22 ENCOUNTER — Ambulatory Visit
Admission: RE | Admit: 2017-02-22 | Discharge: 2017-02-22 | Disposition: A | Discharge status: Home / Self Care | Payer: Medicare Other | Source: Ambulatory Visit | Attending: Family Medicine | Admitting: Family Medicine

## 2017-02-22 DIAGNOSIS — Z1231 Encounter for screening mammogram for malignant neoplasm of breast: Secondary | ICD-10-CM

## 2017-02-26 ENCOUNTER — Ambulatory Visit: Payer: Medicare Other | Admitting: Obstetrics and Gynecology

## 2017-02-28 ENCOUNTER — Ambulatory Visit: Payer: Medicare Other | Admitting: Obstetrics & Gynecology

## 2017-05-19 DIAGNOSIS — R7309 Other abnormal glucose: Secondary | ICD-10-CM

## 2017-05-19 HISTORY — DX: Other abnormal glucose: R73.09

## 2017-05-22 ENCOUNTER — Other Ambulatory Visit: Payer: Self-pay

## 2017-05-22 ENCOUNTER — Ambulatory Visit (INDEPENDENT_AMBULATORY_CARE_PROVIDER_SITE_OTHER): Payer: Medicare Other | Admitting: Obstetrics and Gynecology

## 2017-05-22 ENCOUNTER — Encounter: Payer: Self-pay | Admitting: Obstetrics and Gynecology

## 2017-05-22 VITALS — BP 140/58 | HR 60 | Resp 14 | Ht 64.0 in | Wt 150.0 lb

## 2017-05-22 DIAGNOSIS — Z01419 Encounter for gynecological examination (general) (routine) without abnormal findings: Secondary | ICD-10-CM | POA: Diagnosis not present

## 2017-05-22 NOTE — Progress Notes (Signed)
77 y.o. G0P0000 Divorced Caucasian female here for annual exam.    No concerns.   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 Fostering two dogs.   States she has allergies in the spring and fall.  Uses the Phenergan for not feeling well.  Wellness visit with PCP next month.  Will do labs with PCP.   Did recent blood work at [facility] and Hgb A1C was 5.9.   PCP: Dr. Maurice Small    Patient's last menstrual period was 01/19/1992 (exact date).           Sexually active: No.  The current method of family planning is post menopausal status.    Exercising: Yes.    walking, yoga Smoker:  no  Health Maintenance: Pap:  02/15/16 Pap smear negative History of abnormal Pap:  no MMG:  02/22/17 BIRADS 1 negative/density b Colonoscopy:  07/15/10, normal, repeat in 10 years, neg IFOB in 01/2016 BMD:   05/20/15  Result  Osteopenia.  PCP following.  TDaP:  11/08/16 Gardasil:   n/a HIV: never Hep C: never Screening Labs: PCP   reports that she has quit smoking. She has never used smokeless tobacco. She reports that she drinks about 0.6 oz of alcohol per week. She reports that she does not use drugs.  Past Medical History:  Diagnosis Date  . Diverticulitis   . Former smoker   . Hypertension   . Lateral meniscus tear 2017   left knee  . Migraines   . Osteoarthritis   . TGA (transient global amnesia)    secondary to [place]  . Thyroid disease     Past Surgical History:  Procedure Laterality Date  . DILATION AND CURETTAGE OF UTERUS     menorrhagia  . KNEE ARTHROSCOPY Right 10/06  . TUBAL LIGATION      Current Outpatient Medications  Medication Sig Dispense Refill  . aspirin 325 MG EC tablet Take 325 mg by mouth every other day.     . benazepril-hydrochlorthiazide (LOTENSIN HCT) 10-12.5 MG per tablet Take 1 tablet by mouth daily.    . Calcium 500-100 MG-UNIT CHEW Chew 2 tablets by mouth daily.    Marland Kitchen levothyroxine (SYNTHROID, LEVOTHROID) 50 MCG tablet Take 1 tablet by mouth daily.     . promethazine (PHENERGAN) 25 MG tablet Take 1 tablet (25 mg total) by mouth every 6 (six) hours as needed for nausea or vomiting. 30 tablet 3  . Vitamin D, Ergocalciferol, (DRISDOL) 50000 units CAPS capsule TAKE 1 CAPSULE BY MOUTH ONCE WEEKLY. (Patient not taking: Reported on 05/22/2017) 30 capsule 3   No current facility-administered medications for this visit.     Family History  Problem Relation Age of Onset  . Heart disease Mother   . Diabetes Father   . Heart failure Father   . COPD Father   . Hyperlipidemia Sister   . Diabetes Brother     ROS:  Pertinent items are noted in HPI.  Otherwise, a comprehensive ROS was negative.  Exam:   BP (!) 140/58 (BP Location: Right Arm, Patient Position: Sitting, Cuff Size: Normal)   Pulse 60   Resp 14   Ht 5\' 4"  (1.626 m)   Wt 150 lb (68 kg)   LMP 01/19/1992 (Exact Date)   BMI 25.75 kg/m     General appearance: alert, cooperative and appears stated age Head: Normocephalic, without obvious abnormality, atraumatic Neck: no adenopathy, supple, symmetrical, trachea midline and thyroid normal to inspection and palpation Lungs: clear to auscultation bilaterally  Breasts: normal appearance, no masses or tenderness, No nipple retraction or dimpling, No nipple discharge or bleeding, No axillary or supraclavicular adenopathy Heart: regular rate and rhythm Abdomen: soft, non-tender; no masses, no organomegaly Extremities: extremities normal, atraumatic, no cyanosis or edema Skin: Skin color, texture, turgor normal. No rashes or lesions Lymph nodes: Cervical, supraclavicular, and axillary nodes normal. No abnormal inguinal nodes palpated Neurologic: Grossly normal  Pelvic: External genitalia:  no lesions              Urethra:  normal appearing urethra with no masses, tenderness or lesions              Bartholins and Skenes: normal                 Vagina: normal appearing vagina with normal color and discharge, no lesions              Cervix:  no lesions              Pap taken: No. Bimanual Exam:  Uterus:  normal size, contour, position, consistency, mobility, non-tender              Adnexa: no mass, fullness, tenderness              Rectal exam: Yes.  .  Confirms.              Anus:  normal sphincter tone, no lesions  Chaperone was present for exam.  Assessment:   Well woman visit with normal exam. Osteopenia.  HTN.  FH CAD.  Plan: Mammogram screening discussed. Recommended self breast awareness. Pap and HR HPV next year. Guidelines for Calcium, Vitamin D, regular exercise program including cardiovascular and weight bearing exercise. I recommend she reduce her ASA to 81 mg daily if she wishes to continue with some form of aspirin. We discussed risk of gastritis with the 325 mg dosing.  BMD due with PCP.  Follow up annually and prn.   After visit summary provided.

## 2017-06-06 ENCOUNTER — Encounter: Payer: Self-pay | Admitting: Obstetrics and Gynecology

## 2018-06-21 ENCOUNTER — Ambulatory Visit: Payer: Medicare Other | Admitting: Obstetrics and Gynecology

## 2018-07-04 ENCOUNTER — Ambulatory Visit: Payer: Medicare Other | Admitting: Certified Nurse Midwife

## 2018-08-29 ENCOUNTER — Other Ambulatory Visit: Payer: Self-pay

## 2018-08-29 NOTE — Progress Notes (Signed)
78 y.o. G0P0000 Divorced  Caucasian Fe here for annual exam. Menopausal no HRT. Denies vaginal bleeding and some vaginal dryness. Sees Dr. Valentina LucksGriffin for hypertension, hypothyroid and labs. Also taking calcium and Vitamin D supplement without problems. Active and does all her on care. No health issues today.  Patient's last menstrual period was 01/19/1992 (exact date).          Sexually active: No.  The current method of family planning is post menopausal status.    Exercising: Yes.    yardwork Smoker:  Former smoker   Review of Systems  Constitutional: Negative.   HENT: Negative.   Eyes: Negative.   Respiratory: Negative.   Cardiovascular: Negative.   Gastrointestinal: Negative.   Genitourinary: Negative.   Musculoskeletal: Negative.   Skin: Negative.   Neurological: Negative.   Endo/Heme/Allergies: Negative.   Psychiatric/Behavioral: Negative.     Health Maintenance: Pap:  02-15-16 negative           01-26-14 negative  History of Abnormal Pap: no MMG:  02-22-17 density B/BIRADS 1 negative  Self Breast exams: occ Colonoscopy:  07/15/10, normal, repeat in 10 years, neg IFOB in 01/2016 BMD:   05/20/15  Result  Osteopenia.  PCP following and will do with her next appointment TDaP:  11-08-16 Shingles: not done Pneumonia: 2015 Hep C and HIV: not done Labs: PCP   reports that she has quit smoking. She has never used smokeless tobacco. She reports current alcohol use. She reports that she does not use drugs.  Past Medical History:  Diagnosis Date  . Diverticulitis   . Elevated hemoglobin A1c 05/19/2017   5.9 at health screening assessment  . Former smoker   . Hypertension   . Lateral meniscus tear 2017   left knee  . Migraines   . Osteoarthritis   . TGA (transient global amnesia)    secondary to [place]  . Thyroid disease     Past Surgical History:  Procedure Laterality Date  . DILATION AND CURETTAGE OF UTERUS     menorrhagia  . KNEE ARTHROSCOPY Right 10/06  . TUBAL  LIGATION      Current Outpatient Medications  Medication Sig Dispense Refill  . aspirin 325 MG EC tablet Take 325 mg by mouth every other day.     . benazepril-hydrochlorthiazide (LOTENSIN HCT) 10-12.5 MG per tablet Take 1 tablet by mouth daily.    Marland Kitchen. CALCIUM PO Take 2 tablets by mouth daily. 1200mg  calcium + d    . levothyroxine (SYNTHROID, LEVOTHROID) 50 MCG tablet Take 1 tablet by mouth daily.    Marland Kitchen. MAGNESIUM PO Take 200 mg by mouth. Takes 2     No current facility-administered medications for this visit.     Family History  Problem Relation Age of Onset  . Heart disease Mother   . Diabetes Father   . Heart failure Father   . COPD Father   . Hyperlipidemia Sister   . Diabetes Brother     ROS:  Pertinent items are noted in HPI.  Otherwise, a comprehensive ROS was negative.  Exam:   BP 112/60   Pulse 68   Temp (!) 97.5 F (36.4 C) (Skin)   Resp 16   Ht 5' 4.25" (1.632 m)   Wt 147 lb (66.7 kg)   LMP 01/19/1992 (Exact Date)   BMI 25.04 kg/m  Height: 5' 4.25" (163.2 cm) Ht Readings from Last 3 Encounters:  08/30/18 5' 4.25" (1.632 m)  05/22/17 5\' 4"  (1.626 m)  02/15/16 5\' 4"  (  1.626 m)    General appearance: alert, cooperative and appears stated age Head: Normocephalic, without obvious abnormality, atraumatic Neck: no adenopathy, supple, symmetrical, trachea midline and thyroid normal to inspection and palpation Lungs: clear to auscultation bilaterally Breasts: normal appearance, no masses or tenderness, No nipple retraction or dimpling, No nipple discharge or bleeding, No axillary or supraclavicular adenopathy Heart: regular rate and rhythm Abdomen: soft, non-tender; no masses,  no organomegaly Extremities: extremities normal, atraumatic, no cyanosis or edema Skin: Skin color, texture, turgor normal. No rashes or lesions Lymph nodes: Cervical, supraclavicular, and axillary nodes normal. No abnormal inguinal nodes palpated Neurologic: Grossly normal   Pelvic:  External genitalia:  no lesions, atrophic appearrance              Urethra:  normal appearing urethra with no masses, tenderness or lesions              Bartholin's and Skene's: normal                 Vagina: normal appearing vagina with normal color and discharge, no lesions              Cervix: no cervical motion tenderness, no lesions and nulliparous appearance              Pap taken: Yes.   Bimanual Exam:  Uterus:  normal size, contour, position, consistency, mobility, non-tender and anteverted              Adnexa: normal adnexa and no mass, fullness, tenderness               Rectovaginal: Confirms               Anus:  normal sphincter tone, no lesions  Chaperone present: yes  A:  Well Woman with normal exam  Post menopausal no HRT  Vaginal dryness  Hypertension,Hypothyroid with PCP management  Mammogram due she will schedule  P:   Reviewed health and wellness pertinent to exam  Aware of need to advise if vaginal bleeding  Discussed Olive Oil use for dryness, instructions given.  Continue follow up with PCP as indicated  Pap smear: yes   counseled on breast self exam, mammography screening, feminine hygiene, adequate intake of calcium and vitamin D, diet and exercise, Kegel's exercises  return annually or prn  An After Visit Summary was printed and given to the patient.

## 2018-08-30 ENCOUNTER — Other Ambulatory Visit (HOSPITAL_COMMUNITY)
Admission: RE | Admit: 2018-08-30 | Discharge: 2018-08-30 | Disposition: A | Discharge status: Home / Self Care | Payer: Medicare Other | Source: Ambulatory Visit | Attending: Certified Nurse Midwife | Admitting: Certified Nurse Midwife

## 2018-08-30 ENCOUNTER — Other Ambulatory Visit: Payer: Self-pay

## 2018-08-30 ENCOUNTER — Encounter: Payer: Self-pay | Admitting: Certified Nurse Midwife

## 2018-08-30 ENCOUNTER — Ambulatory Visit (INDEPENDENT_AMBULATORY_CARE_PROVIDER_SITE_OTHER): Payer: Medicare Other | Admitting: Certified Nurse Midwife

## 2018-08-30 VITALS — BP 112/60 | HR 68 | Temp 97.5°F | Resp 16 | Ht 64.25 in | Wt 147.0 lb

## 2018-08-30 DIAGNOSIS — Z780 Asymptomatic menopausal state: Secondary | ICD-10-CM | POA: Insufficient documentation

## 2018-08-30 DIAGNOSIS — Z124 Encounter for screening for malignant neoplasm of cervix: Secondary | ICD-10-CM | POA: Insufficient documentation

## 2018-08-30 DIAGNOSIS — N951 Menopausal and female climacteric states: Secondary | ICD-10-CM | POA: Diagnosis not present

## 2018-08-30 DIAGNOSIS — Z1151 Encounter for screening for human papillomavirus (HPV): Secondary | ICD-10-CM | POA: Insufficient documentation

## 2018-08-30 DIAGNOSIS — Z01419 Encounter for gynecological examination (general) (routine) without abnormal findings: Secondary | ICD-10-CM | POA: Diagnosis not present

## 2018-08-30 NOTE — Patient Instructions (Signed)

## 2018-09-02 LAB — CYTOLOGY - PAP
Diagnosis: NEGATIVE
HPV: NOT DETECTED

## 2018-09-05 ENCOUNTER — Other Ambulatory Visit: Payer: Self-pay

## 2018-09-05 ENCOUNTER — Other Ambulatory Visit: Payer: Self-pay | Admitting: Family Medicine

## 2018-09-05 ENCOUNTER — Ambulatory Visit
Admission: RE | Admit: 2018-09-05 | Discharge: 2018-09-05 | Disposition: A | Discharge status: Home / Self Care | Payer: Medicare Other | Source: Ambulatory Visit | Attending: Family Medicine | Admitting: Family Medicine

## 2018-09-05 DIAGNOSIS — Z1231 Encounter for screening mammogram for malignant neoplasm of breast: Secondary | ICD-10-CM

## 2019-04-30 ENCOUNTER — Encounter: Payer: Self-pay | Admitting: Certified Nurse Midwife

## 2019-08-28 ENCOUNTER — Telehealth: Payer: Self-pay | Admitting: Obstetrics and Gynecology

## 2019-08-28 NOTE — Telephone Encounter (Signed)
Left message on voicemail to call and reschedule cancelled appointment. °

## 2019-09-03 ENCOUNTER — Ambulatory Visit: Payer: Medicare Other | Admitting: Certified Nurse Midwife

## 2019-09-26 ENCOUNTER — Ambulatory Visit: Payer: Medicare Other | Admitting: Obstetrics and Gynecology

## 2019-09-29 ENCOUNTER — Other Ambulatory Visit: Payer: Self-pay | Admitting: Family Medicine

## 2019-09-29 DIAGNOSIS — Z1231 Encounter for screening mammogram for malignant neoplasm of breast: Secondary | ICD-10-CM

## 2019-09-30 ENCOUNTER — Ambulatory Visit
Admission: RE | Admit: 2019-09-30 | Discharge: 2019-09-30 | Disposition: A | Discharge status: Home / Self Care | Payer: Medicare Other | Source: Ambulatory Visit | Attending: Family Medicine | Admitting: Family Medicine

## 2019-09-30 ENCOUNTER — Other Ambulatory Visit: Payer: Self-pay

## 2019-09-30 DIAGNOSIS — Z1231 Encounter for screening mammogram for malignant neoplasm of breast: Secondary | ICD-10-CM

## 2019-10-03 ENCOUNTER — Ambulatory Visit: Payer: Medicare Other | Admitting: Obstetrics and Gynecology

## 2019-10-15 ENCOUNTER — Other Ambulatory Visit: Payer: Self-pay | Admitting: Family Medicine

## 2019-10-15 DIAGNOSIS — M8580 Other specified disorders of bone density and structure, unspecified site: Secondary | ICD-10-CM

## 2020-01-27 ENCOUNTER — Ambulatory Visit
Admission: RE | Admit: 2020-01-27 | Discharge: 2020-01-27 | Disposition: A | Discharge status: Home / Self Care | Payer: Medicare Other | Source: Ambulatory Visit | Attending: Family Medicine | Admitting: Family Medicine

## 2020-01-27 ENCOUNTER — Other Ambulatory Visit: Payer: Self-pay

## 2020-01-27 DIAGNOSIS — M8580 Other specified disorders of bone density and structure, unspecified site: Secondary | ICD-10-CM

## 2020-03-02 DIAGNOSIS — H15111 Episcleritis periodica fugax, right eye: Secondary | ICD-10-CM | POA: Diagnosis not present

## 2020-04-16 DIAGNOSIS — R7303 Prediabetes: Secondary | ICD-10-CM | POA: Diagnosis not present

## 2020-04-16 DIAGNOSIS — E039 Hypothyroidism, unspecified: Secondary | ICD-10-CM | POA: Diagnosis not present

## 2020-04-16 DIAGNOSIS — N183 Chronic kidney disease, stage 3 unspecified: Secondary | ICD-10-CM | POA: Diagnosis not present

## 2020-04-16 DIAGNOSIS — M859 Disorder of bone density and structure, unspecified: Secondary | ICD-10-CM | POA: Diagnosis not present

## 2020-04-16 DIAGNOSIS — I129 Hypertensive chronic kidney disease with stage 1 through stage 4 chronic kidney disease, or unspecified chronic kidney disease: Secondary | ICD-10-CM | POA: Diagnosis not present

## 2020-04-16 DIAGNOSIS — E7800 Pure hypercholesterolemia, unspecified: Secondary | ICD-10-CM | POA: Diagnosis not present

## 2020-08-19 DIAGNOSIS — H18413 Arcus senilis, bilateral: Secondary | ICD-10-CM | POA: Diagnosis not present

## 2020-08-19 DIAGNOSIS — H02831 Dermatochalasis of right upper eyelid: Secondary | ICD-10-CM | POA: Diagnosis not present

## 2020-08-19 DIAGNOSIS — H16223 Keratoconjunctivitis sicca, not specified as Sjogren's, bilateral: Secondary | ICD-10-CM | POA: Diagnosis not present

## 2020-08-19 DIAGNOSIS — Z961 Presence of intraocular lens: Secondary | ICD-10-CM | POA: Diagnosis not present

## 2020-10-29 DIAGNOSIS — E039 Hypothyroidism, unspecified: Secondary | ICD-10-CM | POA: Diagnosis not present

## 2020-10-29 DIAGNOSIS — I129 Hypertensive chronic kidney disease with stage 1 through stage 4 chronic kidney disease, or unspecified chronic kidney disease: Secondary | ICD-10-CM | POA: Diagnosis not present

## 2020-10-29 DIAGNOSIS — Z1389 Encounter for screening for other disorder: Secondary | ICD-10-CM | POA: Diagnosis not present

## 2020-10-29 DIAGNOSIS — E7800 Pure hypercholesterolemia, unspecified: Secondary | ICD-10-CM | POA: Diagnosis not present

## 2020-10-29 DIAGNOSIS — Z0000 Encounter for general adult medical examination without abnormal findings: Secondary | ICD-10-CM | POA: Diagnosis not present

## 2020-10-29 DIAGNOSIS — R7303 Prediabetes: Secondary | ICD-10-CM | POA: Diagnosis not present

## 2020-10-29 DIAGNOSIS — E559 Vitamin D deficiency, unspecified: Secondary | ICD-10-CM | POA: Diagnosis not present

## 2020-10-29 DIAGNOSIS — Z23 Encounter for immunization: Secondary | ICD-10-CM | POA: Diagnosis not present

## 2020-11-12 DIAGNOSIS — H903 Sensorineural hearing loss, bilateral: Secondary | ICD-10-CM | POA: Diagnosis not present

## 2020-11-25 ENCOUNTER — Other Ambulatory Visit: Payer: Self-pay | Admitting: Family Medicine

## 2020-11-25 DIAGNOSIS — Z1231 Encounter for screening mammogram for malignant neoplasm of breast: Secondary | ICD-10-CM

## 2020-11-26 ENCOUNTER — Ambulatory Visit
Admission: RE | Admit: 2020-11-26 | Discharge: 2020-11-26 | Disposition: A | Discharge status: Home / Self Care | Payer: Medicare Other | Source: Ambulatory Visit | Attending: Family Medicine | Admitting: Family Medicine

## 2020-11-26 ENCOUNTER — Other Ambulatory Visit: Payer: Self-pay

## 2020-11-26 DIAGNOSIS — Z1231 Encounter for screening mammogram for malignant neoplasm of breast: Secondary | ICD-10-CM | POA: Diagnosis not present

## 2021-04-29 DIAGNOSIS — R7303 Prediabetes: Secondary | ICD-10-CM | POA: Diagnosis not present

## 2021-04-29 DIAGNOSIS — E039 Hypothyroidism, unspecified: Secondary | ICD-10-CM | POA: Diagnosis not present

## 2021-04-29 DIAGNOSIS — I129 Hypertensive chronic kidney disease with stage 1 through stage 4 chronic kidney disease, or unspecified chronic kidney disease: Secondary | ICD-10-CM | POA: Diagnosis not present

## 2021-04-29 DIAGNOSIS — E7800 Pure hypercholesterolemia, unspecified: Secondary | ICD-10-CM | POA: Diagnosis not present

## 2021-04-29 DIAGNOSIS — N183 Chronic kidney disease, stage 3 unspecified: Secondary | ICD-10-CM | POA: Diagnosis not present

## 2021-08-19 DIAGNOSIS — H02831 Dermatochalasis of right upper eyelid: Secondary | ICD-10-CM | POA: Diagnosis not present

## 2021-08-19 DIAGNOSIS — H18413 Arcus senilis, bilateral: Secondary | ICD-10-CM | POA: Diagnosis not present

## 2021-08-19 DIAGNOSIS — H16223 Keratoconjunctivitis sicca, not specified as Sjogren's, bilateral: Secondary | ICD-10-CM | POA: Diagnosis not present

## 2021-08-19 DIAGNOSIS — Z961 Presence of intraocular lens: Secondary | ICD-10-CM | POA: Diagnosis not present

## 2021-08-29 ENCOUNTER — Telehealth: Payer: Self-pay | Admitting: Family Medicine

## 2021-08-29 NOTE — Telephone Encounter (Signed)
Please set up a new visit when possible.  Thanks.  Glad to see patient.

## 2021-08-29 NOTE — Telephone Encounter (Signed)
Patient called in wanting to know if Dr. Para March would take her on as a new patient. Her brother Jaeanna Mccomber, her nephew Joelle [place], and a lot of her church members see him and has said nothing but good things. Her current PCP is retiring at the end of the summer and would like to have Dr. Para March as her new PCP. Please advise. Thank you!

## 2021-08-30 NOTE — Telephone Encounter (Signed)
Patient has been scheduled

## 2021-10-30 ENCOUNTER — Encounter (HOSPITAL_COMMUNITY): Payer: Self-pay | Admitting: Emergency Medicine

## 2021-10-30 ENCOUNTER — Emergency Department (HOSPITAL_COMMUNITY)
Admission: EM | Admit: 2021-10-30 | Discharge: 2021-10-31 | Disposition: A | Discharge status: Home / Self Care | Payer: Medicare Other | Attending: Emergency Medicine | Admitting: Emergency Medicine

## 2021-10-30 ENCOUNTER — Other Ambulatory Visit: Payer: Self-pay

## 2021-10-30 DIAGNOSIS — Z79899 Other long term (current) drug therapy: Secondary | ICD-10-CM | POA: Diagnosis not present

## 2021-10-30 DIAGNOSIS — R404 Transient alteration of awareness: Secondary | ICD-10-CM | POA: Diagnosis not present

## 2021-10-30 DIAGNOSIS — R42 Dizziness and giddiness: Secondary | ICD-10-CM | POA: Insufficient documentation

## 2021-10-30 DIAGNOSIS — R112 Nausea with vomiting, unspecified: Secondary | ICD-10-CM | POA: Diagnosis not present

## 2021-10-30 DIAGNOSIS — Z20822 Contact with and (suspected) exposure to covid-19: Secondary | ICD-10-CM | POA: Diagnosis not present

## 2021-10-30 DIAGNOSIS — D72819 Decreased white blood cell count, unspecified: Secondary | ICD-10-CM | POA: Insufficient documentation

## 2021-10-30 DIAGNOSIS — Z7982 Long term (current) use of aspirin: Secondary | ICD-10-CM | POA: Diagnosis not present

## 2021-10-30 DIAGNOSIS — Z743 Need for continuous supervision: Secondary | ICD-10-CM | POA: Diagnosis not present

## 2021-10-30 DIAGNOSIS — R9431 Abnormal electrocardiogram [ECG] [EKG]: Secondary | ICD-10-CM | POA: Diagnosis not present

## 2021-10-30 DIAGNOSIS — R7309 Other abnormal glucose: Secondary | ICD-10-CM | POA: Diagnosis not present

## 2021-10-30 DIAGNOSIS — R6889 Other general symptoms and signs: Secondary | ICD-10-CM | POA: Diagnosis not present

## 2021-10-30 LAB — COMPREHENSIVE METABOLIC PANEL
ALT: 14 U/L (ref 0–44)
AST: 23 U/L (ref 15–41)
Albumin: 4.1 g/dL (ref 3.5–5.0)
Alkaline Phosphatase: 68 U/L (ref 38–126)
Anion gap: 13 (ref 5–15)
BUN: 19 mg/dL (ref 8–23)
CO2: 21 mmol/L — ABNORMAL LOW (ref 22–32)
Calcium: 9.7 mg/dL (ref 8.9–10.3)
Chloride: 101 mmol/L (ref 98–111)
Creatinine, Ser: 0.99 mg/dL (ref 0.44–1.00)
GFR, Estimated: 58 mL/min — ABNORMAL LOW (ref >60)
Glucose, Bld: 113 mg/dL — ABNORMAL HIGH (ref 70–99)
Potassium: 3.5 mmol/L (ref 3.5–5.1)
Sodium: 135 mmol/L (ref 135–145)
Total Bilirubin: 1.6 mg/dL — ABNORMAL HIGH (ref 0.3–1.2)
Total Protein: 6.8 g/dL (ref 6.5–8.1)

## 2021-10-30 LAB — CBC
HCT: 41.5 % (ref 36.0–46.0)
Hemoglobin: 13.5 g/dL (ref 12.0–15.0)
MCH: 28.4 pg (ref 26.0–34.0)
MCHC: 32.5 g/dL (ref 30.0–36.0)
MCV: 87.2 fL (ref 80.0–100.0)
Platelets: 237 10*3/uL (ref 150–400)
RBC: 4.76 MIL/uL (ref 3.87–5.11)
RDW: 17.2 % — ABNORMAL HIGH (ref 11.5–15.5)
WBC: 3.3 10*3/uL — ABNORMAL LOW (ref 4.0–10.5)
nRBC: 0 % (ref 0.0–0.2)

## 2021-10-30 LAB — LIPASE, BLOOD: Lipase: 36 U/L (ref 11–51)

## 2021-10-30 NOTE — ED Triage Notes (Addendum)
Per [facility], from home alone, c/o N/v since 11pm last night.  Reports dizziness when she "gets up and moves around."  No hx of vertigo.  No other neuro symptoms  155/86 HR 64 98% RA CBG 153  18G Left AC 4mg  zofran - some relief  Corrections - per pt, she has a hx of vertigo "a long, long time ago.

## 2021-10-31 ENCOUNTER — Emergency Department (HOSPITAL_COMMUNITY): Payer: Medicare Other

## 2021-10-31 DIAGNOSIS — R42 Dizziness and giddiness: Secondary | ICD-10-CM | POA: Diagnosis not present

## 2021-10-31 LAB — URINALYSIS, ROUTINE W REFLEX MICROSCOPIC
Bacteria, UA: NONE SEEN
Bilirubin Urine: NEGATIVE
Glucose, UA: NEGATIVE mg/dL
Hgb urine dipstick: NEGATIVE
Ketones, ur: 20 mg/dL — AB
Leukocytes,Ua: NEGATIVE
Nitrite: NEGATIVE
Protein, ur: 30 mg/dL — AB
Specific Gravity, Urine: 1.016 (ref 1.005–1.030)
pH: 9 — ABNORMAL HIGH (ref 5.0–8.0)

## 2021-10-31 LAB — SARS CORONAVIRUS 2 BY RT PCR: SARS Coronavirus 2 by RT PCR: NEGATIVE

## 2021-10-31 LAB — TROPONIN I (HIGH SENSITIVITY): Troponin I (High Sensitivity): 11 ng/L (ref <18)

## 2021-10-31 MED ORDER — ONDANSETRON HCL 4 MG/2ML IJ SOLN
4.0000 mg | Freq: Once | INTRAMUSCULAR | Status: AC
  Administered 2021-10-31: 4 mg via INTRAVENOUS
  Filled 2021-10-31: qty 2

## 2021-10-31 MED ORDER — LACTATED RINGERS IV BOLUS
1000.0000 mL | Freq: Once | INTRAVENOUS | Status: AC
  Administered 2021-10-31: 1000 mL via INTRAVENOUS

## 2021-10-31 MED ORDER — ONDANSETRON HCL 8 MG PO TABS: 8.0000 mg | ORAL_TABLET | Freq: Three times a day (TID) | ORAL | 0 refills | Status: DC | PRN

## 2021-10-31 NOTE — Discharge Instructions (Signed)
You were evaluated here for nausea and vomiting and lightheadedness. Your labs obtained and showed no evidence of bleeding or acute electrolyte abnormality EKG shows some nonspecific ST changes which I suspect are chronic and heart enzymes were obtained which are normal CT head obtained and showed no evidence of acute abnormality.  Given your response to IV fluids I suspect that you had some dehydration. Please continue to take p.o. fluids.  You are given a prescription for nausea medication. Please follow-up with your doctor this week. Return to the emergency department if you are having new symptoms including chest pain, shortness of breath, or become unable to tolerate liquids

## 2021-10-31 NOTE — ED Provider Notes (Signed)
[facility] EMERGENCY DEPARTMENT Provider Note   CSN: [id number] Arrival date & time: 10/30/21  1908     History  Chief Complaint  Patient presents with  . Emesis    [name] is a 81 y.o. female.  HPI 81 year old female presents today complaining of lightheadedness and vertigo.  She states that 2 days ago in the evening, on Saturday she began having some spinning sensation.  She felt nauseated at that time.  Had very little fluid to drink on Saturday.  Symptoms continued on Sunday and she had increased vomiting.  She states she vomited multiple times.  She did not note any blood or bile.  She attempted to push fluids including Pedialyte, water, and an orange.  She continues to feel nauseated.  The dizziness has improved.  Denies headache, neck pain, chest pain, dyspnea, abdominal pain, , chills, UTI symptoms, symptoms of GI bleed or diarrhea.  She has had all of her COVID vaccines except the most recent and has no known exposures.    Home Medications Prior to Admission medications   Medication Sig Start Date End Date Taking? Authorizing Provider  ondansetron (ZOFRAN) 8 MG tablet Take 1 tablet (8 mg total) by mouth every 8 (eight) hours as needed for nausea or vomiting. 10/31/21  Yes Margarita Grizzle, MD  aspirin 325 MG EC tablet Take 325 mg by mouth every other day.     [provider]  benazepril-hydrochlorthiazide (LOTENSIN HCT) 10-12.5 MG per tablet Take 1 tablet by mouth daily.    [provider]  CALCIUM PO Take 2 tablets by mouth daily. 1200mg  calcium + d 02/15/16   [provider]  levothyroxine (SYNTHROID, LEVOTHROID) 50 MCG tablet Take 1 tablet by mouth daily. 02/01/16   02/03/16, MD  MAGNESIUM PO Take 200 mg by mouth. Takes 2    [provider]      Allergies    Codeine, Hydrocodone, Sulfa antibiotics, and Sulfa antibiotics    Review of Systems   Review of Systems  Physical Exam Updated Vital Signs BP  (!) 137/56   Pulse 61   Temp 98.4 F (36.9 C) (Oral)   Resp 14   Ht 1.626 m (5\' 4" )   Wt 60.3 kg   LMP 01/19/1992 (Exact Date)   SpO2 100%   BMI 22.83 kg/m  Physical Exam Vitals reviewed.  Constitutional:      Appearance: Normal appearance.  HENT:     Head: Normocephalic.     Right Ear: External ear normal.     Left Ear: External ear normal.     Mouth/Throat:     Pharynx: Oropharynx is clear.  Eyes:     Extraocular Movements: Extraocular movements intact.     Pupils: Pupils are equal, round, and reactive to light.  Cardiovascular:     Rate and Rhythm: Normal rate and regular rhythm.     Pulses: Normal pulses.  Pulmonary:     Effort: Pulmonary effort is normal.     Breath sounds: Normal breath sounds.  Abdominal:     General: Abdomen is flat. Bowel sounds are normal. There is no distension.     Tenderness: There is no abdominal tenderness.  Musculoskeletal:        General: Normal range of motion.     Cervical back: Normal range of motion.  Skin:    General: Skin is warm.     Capillary Refill: Capillary refill takes less than 2 seconds.  Neurological:  General: No focal deficit present.     Mental Status: She is alert.     Cranial Nerves: No cranial nerve deficit.     Sensory: No sensory deficit.     Motor: No weakness.     Coordination: Coordination normal.     Deep Tendon Reflexes: Reflexes normal.  Psychiatric:        Mood and Affect: Mood normal.        Behavior: Behavior normal.     ED Results / Procedures / Treatments   Labs (all labs ordered are listed, but only abnormal results are displayed) Labs Reviewed  COMPREHENSIVE METABOLIC PANEL - Abnormal; Notable for the following components:      Result Value   CO2 21 (*)    Glucose, Bld 113 (*)    Total Bilirubin 1.6 (*)    GFR, Estimated 58 (*)    All other components within normal limits  CBC - Abnormal; Notable for the following components:   WBC 3.3 (*)    RDW 17.2 (*)    All other components  within normal limits  URINALYSIS, ROUTINE W REFLEX MICROSCOPIC - Abnormal; Notable for the following components:   pH 9.0 (*)    Ketones, ur 20 (*)    Protein, ur 30 (*)    All other components within normal limits  SARS CORONAVIRUS 2 BY RT PCR  LIPASE, BLOOD  TROPONIN I (HIGH SENSITIVITY)  TROPONIN I (HIGH SENSITIVITY)    EKG EKG Interpretation  Date/Time:  Sunday October 30 2021 19:25:28 EDT Ventricular Rate:  65 PR Interval:  130 QRS Duration: 78 QT Interval:  422 QTC Calculation: 438 R Axis:   72 Text Interpretation: Normal sinus rhythm Low voltage QRS Cannot rule out Anterior infarct , age undetermined ST & T wave abnormality, consider inferolateral ischemia Abnormal ECG When compared with ECG of 14-Nov-2004 11:48, nsst  v4 and v4were previously present in ekg of  14 November 2004 Confirmed by Pattricia Boss [phone]) on 10/31/2021 10:52:18 AM  Radiology CT Head Wo Contrast  Result Date: 10/31/2021 CLINICAL DATA:  Dizziness. EXAM: CT HEAD WITHOUT CONTRAST TECHNIQUE: Contiguous axial images were obtained from the base of the skull through the vertex without intravenous contrast. RADIATION DOSE REDUCTION: This exam was performed according to the departmental dose-optimization program which includes automated exposure control, adjustment of the mA and/or kV according to patient size and/or use of iterative reconstruction technique. COMPARISON:  March 11, 2005 FINDINGS: Brain: No evidence of acute infarction, hemorrhage, hydrocephalus, extra-axial collection or mass lesion/mass effect. Vascular: No hyperdense vessel or unexpected calcification. Skull: Normal. Negative for fracture or focal lesion. Sinuses/Orbits: No acute finding. Other: None. IMPRESSION: No acute intracranial abnormalities. No cause for the patient's symptoms identified. Electronically Signed   By: Dorise Bullion III M.D.   On: 10/31/2021 10:26    Procedures Procedures    Medications Ordered in ED Medications   lactated ringers bolus 1,000 mL (0 mLs Intravenous Stopped 10/31/21 1116)  ondansetron (ZOFRAN) injection 4 mg (4 mg Intravenous Given 10/31/21 1116)    ED Course/ Medical Decision Making/ A&P Clinical Course as of 10/31/21 1238  Mon Oct 31, 2021  1044 CT head reviewed interpreted no evidence of acute abnormality noted on my interpretation and radiologist interpretation concurs [DR]  3875 Complete metabolic panel reviewed interpreted significant for glucose of 113 and bilirubin of 1.6 with normal lipase, normal electrolytes [DR]  1045 CBC(!) CBC is reviewed and interpreted mild leukopenia white count is 3300 otherwise within normal limits [  DR]  1056 Patient feels improved.  Lightheadedness better since fluids. She feels that she cant take po Fluid challenge ordered. [DR]  1238 CT head reviewed interpreted and no evidence of acute intracranial abnormalities are noted [DR]    Clinical Course User Index [DR] Margarita Grizzle, MD                           Medical Decision Making 81 year old female presents today complaining of dizziness and lightheadedness.  She has had nausea and vomiting.  She had not taken p.o. well for the past 48 hours. Differential diagnosis includes but is not limited to dizziness, vertigo, GI etiology including gastritis, gastroenteritis, pancreatitis, or other acute abdominal/GI etiologies.  Patient's symptoms and description appear more consistent with lightheadedness secondary to some volume depletion.  Does not have any focal neurological deficits although she did give some history of the room moving she gives more preponderance of history regarding some volume depletion. She is evaluated here with labs that appear to be within normal limits. She was treated here with IV fluids and antiemetics. Patient is now taking p.o. vomiting.  Her symptoms have resolved. She does not have focal neurological deficits on further imaging is indicated. Patient was evaluated with EKG  which showed some nonspecific ST changes.  Patient does not have an EKG in the system since 2006.  She had some of these changes present at that time.  She is not having chest pain and troponin was evaluated and is normal.  I suspect that these nonspecific EKG changes have been chronically present and did not indicate any ischemia today. Patient will be given home prescription for Zofran. We have discussed continuing her oral hydration. We have discussed return precautions including worsening of symptoms or inability to tolerate fluids.   Amount and/or Complexity of Data Reviewed Labs: ordered. Decision-making details documented in ED Course. Radiology: ordered and independent interpretation performed. Decision-making details documented in ED Course. ECG/medicine tests: ordered and independent interpretation performed. Decision-making details documented in ED Course.  Risk Prescription drug management.           Final Clinical Impression(s) / ED Diagnoses Final diagnoses:  Nausea and vomiting, unspecified vomiting type  Lightheadedness  Nonspecific abnormal electrocardiogram (ECG) (EKG)    Rx / DC Orders ED Discharge Orders          Ordered    ondansetron (ZOFRAN) 8 MG tablet  Every 8 hours PRN        10/31/21 1234              Margarita Grizzle, MD 10/31/21 1234

## 2021-10-31 NOTE — ED Notes (Signed)
Provided pt ginger ale for PO challenge  Pt states she is still feeling dizzy EDP notified

## 2021-10-31 NOTE — ED Notes (Signed)
Pt was able to tolerate her ginger ale   Pt ambulated to restroom without incident

## 2021-10-31 NOTE — ED Notes (Signed)
Pt ambulated with one assist to restroom with steady gait.

## 2021-11-07 ENCOUNTER — Encounter: Payer: Self-pay | Admitting: Family Medicine

## 2021-11-07 ENCOUNTER — Ambulatory Visit (INDEPENDENT_AMBULATORY_CARE_PROVIDER_SITE_OTHER): Payer: Medicare Other | Admitting: Family Medicine

## 2021-11-07 VITALS — BP 112/58 | HR 69 | Temp 98.1°F | Ht 64.0 in | Wt 129.0 lb

## 2021-11-07 DIAGNOSIS — F419 Anxiety disorder, unspecified: Secondary | ICD-10-CM | POA: Diagnosis not present

## 2021-11-07 DIAGNOSIS — K5900 Constipation, unspecified: Secondary | ICD-10-CM | POA: Insufficient documentation

## 2021-11-07 DIAGNOSIS — Z23 Encounter for immunization: Secondary | ICD-10-CM | POA: Diagnosis not present

## 2021-11-07 DIAGNOSIS — K648 Other hemorrhoids: Secondary | ICD-10-CM | POA: Insufficient documentation

## 2021-11-07 DIAGNOSIS — E785 Hyperlipidemia, unspecified: Secondary | ICD-10-CM

## 2021-11-07 DIAGNOSIS — K625 Hemorrhage of anus and rectum: Secondary | ICD-10-CM | POA: Insufficient documentation

## 2021-11-07 DIAGNOSIS — E039 Hypothyroidism, unspecified: Secondary | ICD-10-CM

## 2021-11-07 DIAGNOSIS — I10 Essential (primary) hypertension: Secondary | ICD-10-CM | POA: Diagnosis not present

## 2021-11-07 DIAGNOSIS — Z0000 Encounter for general adult medical examination without abnormal findings: Secondary | ICD-10-CM

## 2021-11-07 DIAGNOSIS — M8580 Other specified disorders of bone density and structure, unspecified site: Secondary | ICD-10-CM

## 2021-11-07 DIAGNOSIS — Z7189 Other specified counseling: Secondary | ICD-10-CM

## 2021-11-07 LAB — LIPID PANEL
Cholesterol: 143 mg/dL (ref 0–200)
HDL: 43.8 mg/dL (ref >39.00)
LDL Cholesterol: 82 mg/dL (ref 0–99)
NonHDL: 98.72
Total CHOL/HDL Ratio: 3
Triglycerides: 83 mg/dL (ref 0.0–149.0)
VLDL: 16.6 mg/dL (ref 0.0–40.0)

## 2021-11-07 LAB — TSH: TSH: 2.79 u[IU]/mL (ref 0.35–5.50)

## 2021-11-07 LAB — VITAMIN D 25 HYDROXY (VIT D DEFICIENCY, FRACTURES): VITD: 57.49 ng/mL (ref 30.00–100.00)

## 2021-11-07 MED ORDER — ALPRAZOLAM 0.25 MG PO TABS: 0.2500 mg | ORAL_TABLET | Freq: Every day | ORAL | 3 refills | Status: DC | PRN

## 2021-11-07 MED ORDER — BENAZEPRIL-HYDROCHLOROTHIAZIDE 10-12.5 MG PO TABS: 1.0000 | ORAL_TABLET | Freq: Every day | ORAL | 3 refills | Status: DC

## 2021-11-07 MED ORDER — LEVOTHYROXINE SODIUM 50 MCG PO TABS: 50.0000 ug | ORAL_TABLET | Freq: Every day | ORAL | 3 refills | Status: DC

## 2021-11-07 NOTE — Patient Instructions (Addendum)
Check your BP at home.  Hold benazepril HCTZ tomorrow and see if you are less lightheaded.  You may need to hold it for a few days.  If your BP is above 140/80, then okay to restart.  Go to the lab on the way out.   If you have mychart we'll likely use that to update you.    Take care.  Glad to see you.

## 2021-11-07 NOTE — Progress Notes (Unsigned)
New patient.    She clearly felt better after IVF at ER.  She felt more anxious on zofran. Then with cough, taking mucinex and that helped. Some sputum, whitish.  No fevers.  Not SOB.  No vomiting now.  Still slightly lightheaded.  Appetite is affected but drinking plenty of fluids.    Hypertension:    Using medication without problems or lightheadedness: yes Chest pain with exertion:no Edema:no Short of breath:no She has prev been able to work outside for hours at a time without chest pain.  Osteopenia.   Noted on bone density 01/27/2020.  Discussed with patient.  Hypothyroid.  Compliant.  No neck mass.  No dysphagia.    Anxiety.  History of benzodiazepine use at baseline.  It helps.  No suicidal or homicidal intent.  Mammogram is pending. Tetanus shot 2018. Pneumonia shot previously done. Flu shot previously done. COVID shot previously done. Shingles shot discussed with patient, to be done. Colon cancer screening deferred given her age. Pap not applicable. Bone density 2021. Advance directive discussed with patient.  Duane Boston and Sister Tamela Oddi equally designated if patient were incapacitated.  Meds, vitals, and allergies reviewed.   ROS: Per HPI unless specifically indicated in ROS section   GEN: nad, alert and oriented HEENT: ncat NECK: supple w/o LA CV: rrr.  PULM: ctab, no inc wob ABD: soft, +bs EXT: no edema SKIN: no acute rash  35 minutes were devoted to patient care in this encounter (this includes time spent reviewing the patient's file/history, interviewing and examining the patient, counseling/reviewing plan with patient).

## 2021-11-09 ENCOUNTER — Encounter: Payer: Self-pay | Admitting: Family Medicine

## 2021-11-09 DIAGNOSIS — M8580 Other specified disorders of bone density and structure, unspecified site: Secondary | ICD-10-CM | POA: Insufficient documentation

## 2021-11-09 DIAGNOSIS — Z0000 Encounter for general adult medical examination without abnormal findings: Secondary | ICD-10-CM | POA: Insufficient documentation

## 2021-11-09 DIAGNOSIS — Z7189 Other specified counseling: Secondary | ICD-10-CM | POA: Insufficient documentation

## 2021-11-09 DIAGNOSIS — E039 Hypothyroidism, unspecified: Secondary | ICD-10-CM | POA: Insufficient documentation

## 2021-11-09 DIAGNOSIS — I10 Essential (primary) hypertension: Secondary | ICD-10-CM | POA: Insufficient documentation

## 2021-11-09 DIAGNOSIS — F419 Anxiety disorder, unspecified: Secondary | ICD-10-CM | POA: Insufficient documentation

## 2021-11-09 NOTE — Assessment & Plan Note (Signed)
Continue levothyroxine 

## 2021-11-09 NOTE — Assessment & Plan Note (Signed)
  Advance directive discussed with patient.  [name] and Sister Tamela Oddi equally designated if patient were incapacitated.

## 2021-11-09 NOTE — Assessment & Plan Note (Signed)
Discussed options and recent illness. I asked her to check her BP at home.  Hold benazepril HCTZ tomorrow and see if less lightheaded.  She may need to hold it for a few days.  If BP is above 140/80, then okay to restart.  See notes on labs.

## 2021-11-09 NOTE — Assessment & Plan Note (Signed)
Mammogram is pending. Tetanus shot 2018. Pneumonia shot previously done. Flu shot previously done. COVID shot previously done. Shingles shot discussed with patient, to be done. Colon cancer screening deferred given her age. Pap not applicable. Bone density 2021. Advance directive discussed with patient.  Duane Boston and Sister Tamela Oddi equally designated if patient were incapacitated.

## 2021-11-09 NOTE — Assessment & Plan Note (Signed)
Continue Xanax use as needed.  We can address this as we go along.

## 2022-01-10 ENCOUNTER — Other Ambulatory Visit: Payer: Self-pay | Admitting: Family Medicine

## 2022-01-10 DIAGNOSIS — Z1231 Encounter for screening mammogram for malignant neoplasm of breast: Secondary | ICD-10-CM

## 2022-02-17 ENCOUNTER — Ambulatory Visit
Admission: RE | Admit: 2022-02-17 | Discharge: 2022-02-17 | Disposition: A | Discharge status: Home / Self Care | Payer: Medicare Other | Source: Ambulatory Visit | Attending: Family Medicine | Admitting: Family Medicine

## 2022-02-17 DIAGNOSIS — Z1231 Encounter for screening mammogram for malignant neoplasm of breast: Secondary | ICD-10-CM

## 2022-03-06 ENCOUNTER — Ambulatory Visit: Payer: Medicare Other

## 2022-03-07 DIAGNOSIS — H18413 Arcus senilis, bilateral: Secondary | ICD-10-CM | POA: Diagnosis not present

## 2022-03-07 DIAGNOSIS — H15111 Episcleritis periodica fugax, right eye: Secondary | ICD-10-CM | POA: Diagnosis not present

## 2022-03-07 DIAGNOSIS — Z961 Presence of intraocular lens: Secondary | ICD-10-CM | POA: Diagnosis not present

## 2022-03-07 DIAGNOSIS — H16223 Keratoconjunctivitis sicca, not specified as Sjogren's, bilateral: Secondary | ICD-10-CM | POA: Diagnosis not present

## 2022-05-11 ENCOUNTER — Other Ambulatory Visit: Payer: Self-pay | Admitting: Family Medicine

## 2022-05-11 NOTE — Telephone Encounter (Signed)
Refill request for ALPRAZOLAM 0.25 MG TABS 0.25 Tablet   LOV - 11/07/21 Next OV - 05/16/22 Last refill - 11/07/21 #30/3

## 2022-05-16 ENCOUNTER — Encounter: Payer: Self-pay | Admitting: Family Medicine

## 2022-05-16 ENCOUNTER — Ambulatory Visit (INDEPENDENT_AMBULATORY_CARE_PROVIDER_SITE_OTHER): Payer: Medicare Other | Admitting: Family Medicine

## 2022-05-16 VITALS — BP 118/58 | HR 62 | Temp 98.4°F | Ht 64.0 in | Wt 130.0 lb

## 2022-05-16 DIAGNOSIS — F419 Anxiety disorder, unspecified: Secondary | ICD-10-CM

## 2022-05-16 DIAGNOSIS — M8580 Other specified disorders of bone density and structure, unspecified site: Secondary | ICD-10-CM | POA: Diagnosis not present

## 2022-05-16 DIAGNOSIS — I10 Essential (primary) hypertension: Secondary | ICD-10-CM

## 2022-05-16 LAB — BASIC METABOLIC PANEL
BUN: 22 mg/dL (ref 6–23)
CO2: 28 mEq/L (ref 19–32)
Calcium: 9.3 mg/dL (ref 8.4–10.5)
Chloride: 104 mEq/L (ref 96–112)
Creatinine, Ser: 0.89 mg/dL (ref 0.40–1.20)
GFR: 61.65 mL/min (ref >60.00)
Glucose, Bld: 98 mg/dL (ref 70–99)
Potassium: 3.9 mEq/L (ref 3.5–5.1)
Sodium: 142 mEq/L (ref 135–145)

## 2022-05-16 LAB — LIPID PANEL
Cholesterol: 171 mg/dL (ref 0–200)
HDL: 59.2 mg/dL (ref >39.00)
LDL Cholesterol: 101 mg/dL — ABNORMAL HIGH (ref 0–99)
NonHDL: 111.65
Total CHOL/HDL Ratio: 3
Triglycerides: 53 mg/dL (ref 0.0–149.0)
VLDL: 10.6 mg/dL (ref 0.0–40.0)

## 2022-05-16 LAB — TSH: TSH: 2.62 u[IU]/mL (ref 0.35–5.50)

## 2022-05-16 LAB — VITAMIN D 25 HYDROXY (VIT D DEFICIENCY, FRACTURES): VITD: 32.73 ng/mL (ref 30.00–100.00)

## 2022-05-16 MED ORDER — SERTRALINE HCL 25 MG PO TABS: 25.0000 mg | ORAL_TABLET | Freq: Every day | ORAL | 3 refills | Status: DC

## 2022-05-16 MED ORDER — BENAZEPRIL-HYDROCHLOROTHIAZIDE 10-12.5 MG PO TABS: 0.5000 | ORAL_TABLET | Freq: Every day | ORAL | Status: DC

## 2022-05-16 NOTE — Patient Instructions (Addendum)
Go to the lab on the way out.   If you have mychart we'll likely use that to update you.    Take care.  Glad to see you. Try breaking the benazepril/HCTZ in half and see if you are less lightheaded.    Try adding on sertraline in about 2 weeks.  See if you are less anxious.  If needed, take it at night.

## 2022-05-16 NOTE — Progress Notes (Unsigned)
Hypertension:    Using medication without problems or lightheadedness:  occ lightheaded.  Chest pain with exertion:no Edema:no Short of breath:no She has occ skipped her BP med with lower BP noted.  Discussed dec dose by 50%.  See AVS.   Healthy diet at baseline.   She is still active at baseline.   She is off RYR in the meantime.  She could tolerate that.    Anxiety.  Usually taking once a day.  It helps.  Discussed options.  She was prev on zoloft with some relief but the rx ran out.    Meds, vitals, and allergies reviewed.   ROS: Per HPI unless specifically indicated in ROS section   GEN: nad, alert and oriented HEENT: mucous membranes moist NECK: supple w/o LA CV: rrr PULM: ctab, no inc wob ABD: soft, +bs EXT: no edema SKIN: no acute rash

## 2022-05-17 ENCOUNTER — Other Ambulatory Visit: Payer: Self-pay | Admitting: Family Medicine

## 2022-05-17 MED ORDER — RED YEAST RICE 600 MG PO TABS: 600.0000 mg | ORAL_TABLET | Freq: Two times a day (BID) | ORAL | Status: DC

## 2022-05-17 NOTE — Assessment & Plan Note (Signed)
She has occ skipped her BP med with lower BP noted.  Discussed dec dose by 50%.  See AVS.   Decrease benazepril hydrochlorothiazide to half tablet daily. Healthy diet at baseline.   She is still active at baseline.

## 2022-05-17 NOTE — Assessment & Plan Note (Signed)
Restart sertraline 25 mg.  Continue benzodiazepine as needed for now but hopefully she will be able to gradually decrease that when she has been taking sertraline for about 1 month.  See after visit summary.  She can update me as needed.  She agrees to plan.  See notes on labs.

## 2022-08-08 ENCOUNTER — Ambulatory Visit (INDEPENDENT_AMBULATORY_CARE_PROVIDER_SITE_OTHER): Payer: Medicare Other | Admitting: Family Medicine

## 2022-08-08 ENCOUNTER — Encounter: Payer: Self-pay | Admitting: Family Medicine

## 2022-08-08 VITALS — BP 118/60 | HR 89 | Temp 99.1°F | Ht 64.0 in | Wt 120.0 lb

## 2022-08-08 DIAGNOSIS — R5383 Other fatigue: Secondary | ICD-10-CM | POA: Diagnosis not present

## 2022-08-08 DIAGNOSIS — R634 Abnormal weight loss: Secondary | ICD-10-CM | POA: Diagnosis not present

## 2022-08-08 NOTE — Progress Notes (Unsigned)
She had variable BP.  Then she stopped taking BP medication about 1 month ago.  She is taking 40-60 oz water per day.  BP similar to today on home checks, ie low end of normal.    Noted she was feeling unbalanced w/o getting vertiginous, upon standing.  Patient also noted fatigued, no appetite and has a bad taste in her mouth.  She lost weight in the meantime.    She is taking xanax mostly daily.  She stopped sertraline, she took it for about 2 months.  She had jumpy legs that predates the sertraline start.    She thought her appetite improved in the last few days with restarting calcium/vit D.   Meds, vitals, and allergies reviewed.   ROS: Per HPI unless specifically indicated in ROS section

## 2022-08-08 NOTE — Patient Instructions (Signed)
Don't change your meds for now.  Go to the lab on the way out.   If you have mychart we'll likely use that to update you.   Take care.  Glad to see you. 

## 2022-08-09 ENCOUNTER — Emergency Department (HOSPITAL_COMMUNITY)
Admission: EM | Admit: 2022-08-09 | Discharge: 2022-08-09 | Disposition: A | Discharge status: Other Acute Inpatient Hospital | Payer: Medicare Other | Attending: Emergency Medicine | Admitting: Emergency Medicine

## 2022-08-09 ENCOUNTER — Other Ambulatory Visit: Payer: Self-pay

## 2022-08-09 ENCOUNTER — Ambulatory Visit: Payer: Medicare Other

## 2022-08-09 ENCOUNTER — Other Ambulatory Visit: Payer: Self-pay | Admitting: Family Medicine

## 2022-08-09 ENCOUNTER — Emergency Department (HOSPITAL_COMMUNITY): Payer: Medicare Other

## 2022-08-09 ENCOUNTER — Telehealth: Payer: Self-pay

## 2022-08-09 ENCOUNTER — Encounter (HOSPITAL_COMMUNITY): Payer: Self-pay | Admitting: *Deleted

## 2022-08-09 DIAGNOSIS — D61810 Antineoplastic chemotherapy induced pancytopenia: Secondary | ICD-10-CM | POA: Diagnosis not present

## 2022-08-09 DIAGNOSIS — D72829 Elevated white blood cell count, unspecified: Secondary | ICD-10-CM | POA: Insufficient documentation

## 2022-08-09 DIAGNOSIS — D696 Thrombocytopenia, unspecified: Secondary | ICD-10-CM | POA: Diagnosis not present

## 2022-08-09 DIAGNOSIS — C9500 Acute leukemia of unspecified cell type not having achieved remission: Secondary | ICD-10-CM

## 2022-08-09 DIAGNOSIS — R5383 Other fatigue: Secondary | ICD-10-CM | POA: Diagnosis not present

## 2022-08-09 DIAGNOSIS — R590 Localized enlarged lymph nodes: Secondary | ICD-10-CM | POA: Diagnosis not present

## 2022-08-09 DIAGNOSIS — Z7982 Long term (current) use of aspirin: Secondary | ICD-10-CM | POA: Insufficient documentation

## 2022-08-09 DIAGNOSIS — R634 Abnormal weight loss: Secondary | ICD-10-CM | POA: Insufficient documentation

## 2022-08-09 DIAGNOSIS — R799 Abnormal finding of blood chemistry, unspecified: Secondary | ICD-10-CM | POA: Diagnosis present

## 2022-08-09 DIAGNOSIS — I517 Cardiomegaly: Secondary | ICD-10-CM | POA: Diagnosis not present

## 2022-08-09 DIAGNOSIS — R0689 Other abnormalities of breathing: Secondary | ICD-10-CM | POA: Diagnosis not present

## 2022-08-09 DIAGNOSIS — C9200 Acute myeloblastic leukemia, not having achieved remission: Secondary | ICD-10-CM | POA: Diagnosis not present

## 2022-08-09 DIAGNOSIS — Z7401 Bed confinement status: Secondary | ICD-10-CM | POA: Diagnosis not present

## 2022-08-09 DIAGNOSIS — F419 Anxiety disorder, unspecified: Secondary | ICD-10-CM

## 2022-08-09 DIAGNOSIS — R5081 Fever presenting with conditions classified elsewhere: Secondary | ICD-10-CM | POA: Diagnosis not present

## 2022-08-09 DIAGNOSIS — D709 Neutropenia, unspecified: Secondary | ICD-10-CM

## 2022-08-09 DIAGNOSIS — R638 Other symptoms and signs concerning food and fluid intake: Secondary | ICD-10-CM | POA: Diagnosis not present

## 2022-08-09 DIAGNOSIS — R9431 Abnormal electrocardiogram [ECG] [EKG]: Secondary | ICD-10-CM | POA: Diagnosis not present

## 2022-08-09 DIAGNOSIS — E039 Hypothyroidism, unspecified: Secondary | ICD-10-CM

## 2022-08-09 DIAGNOSIS — J90 Pleural effusion, not elsewhere classified: Secondary | ICD-10-CM | POA: Diagnosis not present

## 2022-08-09 DIAGNOSIS — R0989 Other specified symptoms and signs involving the circulatory and respiratory systems: Secondary | ICD-10-CM | POA: Diagnosis not present

## 2022-08-09 DIAGNOSIS — D649 Anemia, unspecified: Secondary | ICD-10-CM

## 2022-08-09 DIAGNOSIS — J811 Chronic pulmonary edema: Secondary | ICD-10-CM | POA: Diagnosis not present

## 2022-08-09 DIAGNOSIS — R197 Diarrhea, unspecified: Secondary | ICD-10-CM | POA: Diagnosis not present

## 2022-08-09 DIAGNOSIS — I5030 Unspecified diastolic (congestive) heart failure: Secondary | ICD-10-CM | POA: Diagnosis not present

## 2022-08-09 DIAGNOSIS — Z79890 Hormone replacement therapy (postmenopausal): Secondary | ICD-10-CM | POA: Diagnosis not present

## 2022-08-09 DIAGNOSIS — E8779 Other fluid overload: Secondary | ICD-10-CM | POA: Diagnosis not present

## 2022-08-09 DIAGNOSIS — R509 Fever, unspecified: Secondary | ICD-10-CM | POA: Diagnosis not present

## 2022-08-09 DIAGNOSIS — R059 Cough, unspecified: Secondary | ICD-10-CM | POA: Diagnosis not present

## 2022-08-09 DIAGNOSIS — D72819 Decreased white blood cell count, unspecified: Secondary | ICD-10-CM | POA: Diagnosis not present

## 2022-08-09 DIAGNOSIS — Z452 Encounter for adjustment and management of vascular access device: Secondary | ICD-10-CM | POA: Diagnosis not present

## 2022-08-09 DIAGNOSIS — I10 Essential (primary) hypertension: Secondary | ICD-10-CM | POA: Insufficient documentation

## 2022-08-09 DIAGNOSIS — R627 Adult failure to thrive: Secondary | ICD-10-CM | POA: Diagnosis not present

## 2022-08-09 DIAGNOSIS — J810 Acute pulmonary edema: Secondary | ICD-10-CM | POA: Diagnosis not present

## 2022-08-09 DIAGNOSIS — I700 Atherosclerosis of aorta: Secondary | ICD-10-CM | POA: Diagnosis not present

## 2022-08-09 DIAGNOSIS — R531 Weakness: Secondary | ICD-10-CM | POA: Diagnosis not present

## 2022-08-09 DIAGNOSIS — Z95828 Presence of other vascular implants and grafts: Secondary | ICD-10-CM | POA: Diagnosis not present

## 2022-08-09 DIAGNOSIS — I2510 Atherosclerotic heart disease of native coronary artery without angina pectoris: Secondary | ICD-10-CM | POA: Diagnosis not present

## 2022-08-09 DIAGNOSIS — R5381 Other malaise: Secondary | ICD-10-CM | POA: Diagnosis not present

## 2022-08-09 DIAGNOSIS — R918 Other nonspecific abnormal finding of lung field: Secondary | ICD-10-CM | POA: Diagnosis not present

## 2022-08-09 DIAGNOSIS — Z87891 Personal history of nicotine dependence: Secondary | ICD-10-CM | POA: Diagnosis not present

## 2022-08-09 LAB — COMPREHENSIVE METABOLIC PANEL
ALT: 8 U/L (ref 0–35)
ALT: 13 U/L (ref 0–44)
AST: 17 U/L (ref 0–37)
AST: 19 U/L (ref 15–41)
Albumin: 4 g/dL (ref 3.5–5.2)
Albumin: 3.6 g/dL (ref 3.5–5.0)
Alkaline Phosphatase: 115 U/L (ref 39–117)
Alkaline Phosphatase: 111 U/L (ref 38–126)
Anion gap: 10 (ref 5–15)
BUN: 29 mg/dL — ABNORMAL HIGH (ref 6–23)
BUN: 25 mg/dL — ABNORMAL HIGH (ref 8–23)
CO2: 27 mEq/L (ref 19–32)
CO2: 25 mmol/L (ref 22–32)
Calcium: 9.4 mg/dL (ref 8.4–10.5)
Calcium: 8.8 mg/dL — ABNORMAL LOW (ref 8.9–10.3)
Chloride: 101 mEq/L (ref 96–112)
Chloride: 103 mmol/L (ref 98–111)
Creatinine, Ser: 1.13 mg/dL (ref 0.40–1.20)
Creatinine, Ser: 0.91 mg/dL (ref 0.44–1.00)
GFR, Estimated: >60 mL/min (ref >60)
GFR: 46.21 mL/min — ABNORMAL LOW (ref >60.00)
Glucose, Bld: 127 mg/dL — ABNORMAL HIGH (ref 70–99)
Glucose, Bld: 97 mg/dL (ref 70–99)
Potassium: 4.2 mEq/L (ref 3.5–5.1)
Potassium: 4 mmol/L (ref 3.5–5.1)
Sodium: 137 mEq/L (ref 135–145)
Sodium: 138 mmol/L (ref 135–145)
Total Bilirubin: 0.7 mg/dL (ref 0.2–1.2)
Total Bilirubin: 0.6 mg/dL (ref 0.3–1.2)
Total Protein: 7.6 g/dL (ref 6.0–8.3)
Total Protein: 7.1 g/dL (ref 6.5–8.1)

## 2022-08-09 LAB — TYPE AND SCREEN
ABO/RH(D): A POS
Antibody Screen: NEGATIVE

## 2022-08-09 LAB — CBC WITH DIFFERENTIAL/PLATELET
Abs Immature Granulocytes: 0 10*3/uL (ref 0.00–0.07)
Basophils Absolute: 0 10*3/uL (ref 0.0–0.1)
Basophils Relative: 0 %
Eosinophils Absolute: 0 10*3/uL (ref 0.0–0.5)
Eosinophils Relative: 0 %
HCT: 27.6 % — ABNORMAL LOW (ref 36.0–46.0)
HCT: 26.1 % — ABNORMAL LOW (ref 36.0–46.0)
Hemoglobin: 8.8 g/dL — ABNORMAL LOW (ref 12.0–15.0)
Hemoglobin: 8.1 g/dL — ABNORMAL LOW (ref 12.0–15.0)
Lymphocytes Relative: 20 %
Lymphs Abs: 4.2 10*3/uL — ABNORMAL HIGH (ref 0.7–4.0)
MCH: 31.5 pg (ref 26.0–34.0)
MCHC: 31.9 g/dL (ref 30.0–36.0)
MCHC: 31 g/dL (ref 30.0–36.0)
MCV: 97.4 fl (ref 78.0–100.0)
MCV: 101.6 fL — ABNORMAL HIGH (ref 80.0–100.0)
Monocytes Absolute: 0 10*3/uL — ABNORMAL LOW (ref 0.1–1.0)
Monocytes Relative: 0 %
Neutro Abs: 0 10*3/uL — CL (ref 1.7–7.7)
Neutrophils Relative %: 0 %
Other: 80 %
Platelets: 48 10*3/uL — CL (ref 150.0–400.0)
Platelets: 33 10*3/uL — ABNORMAL LOW (ref 150–400)
RBC: 2.84 Mil/uL — ABNORMAL LOW (ref 3.87–5.11)
RBC: 2.57 MIL/uL — ABNORMAL LOW (ref 3.87–5.11)
RDW: 20 % — ABNORMAL HIGH (ref 11.5–15.5)
RDW: 19 % — ABNORMAL HIGH (ref 11.5–15.5)
WBC: 24.7 10*3/uL (ref 4.0–10.5)
WBC: 20.8 10*3/uL — ABNORMAL HIGH (ref 4.0–10.5)
nRBC: 0.4 % — ABNORMAL HIGH (ref 0.0–0.2)

## 2022-08-09 LAB — URINALYSIS, ROUTINE W REFLEX MICROSCOPIC
Bilirubin Urine: NEGATIVE
Glucose, UA: NEGATIVE mg/dL
Hgb urine dipstick: NEGATIVE
Hgb urine dipstick: NEGATIVE
Ketones, ur: NEGATIVE mg/dL
Leukocytes,Ua: NEGATIVE
Leukocytes,Ua: NEGATIVE
Nitrite: NEGATIVE
Nitrite: NEGATIVE
Protein, ur: NEGATIVE mg/dL
RBC / HPF: NONE SEEN (ref 0–?)
Specific Gravity, Urine: >=1.03 — AB (ref 1.000–1.030)
Specific Gravity, Urine: 1.033 — ABNORMAL HIGH (ref 1.005–1.030)
Urine Glucose: NEGATIVE
Urobilinogen, UA: 1 (ref 0.0–1.0)
pH: 5.5 (ref 5.0–8.0)
pH: 7 (ref 5.0–8.0)

## 2022-08-09 LAB — VITAMIN B12: Vitamin B-12: 215 pg/mL (ref 211–911)

## 2022-08-09 LAB — PROTIME-INR
INR: 1.1 (ref 0.8–1.2)
Prothrombin Time: 14.1 seconds (ref 11.4–15.2)

## 2022-08-09 LAB — TSH: TSH: 1.25 u[IU]/mL (ref 0.35–5.50)

## 2022-08-09 LAB — ABO/RH: ABO/RH(D): A POS

## 2022-08-09 MED ORDER — IOHEXOL 350 MG/ML SOLN
80.0000 mL | Freq: Once | INTRAVENOUS | Status: AC | PRN
  Administered 2022-08-09: 80 mL via INTRAVENOUS

## 2022-08-09 MED ORDER — SODIUM CHLORIDE 0.9 % IV BOLUS
1000.0000 mL | Freq: Once | INTRAVENOUS | Status: AC
  Administered 2022-08-09: 1000 mL via INTRAVENOUS

## 2022-08-09 NOTE — Telephone Encounter (Signed)
Spoke with patient about her results and advised to go to ER. Patient verbalized understanding and stated she will get dressed and go to [facility].

## 2022-08-09 NOTE — ED Provider Notes (Signed)
[facility] EMERGENCY DEPARTMENT AT [facility] Provider Note   CSN: [id number] Arrival date & time: 08/09/22  1235     History  Chief Complaint  Patient presents with   Abnormal labs    [name] is a 82 y.o. female.  Pt is a 82 yo female with pmhx significant for hypothyroidism, htn, diverticulitis and migraines.  Pt said she has not felt well for the past month, so she went to her pcp yesterday.  She has lost about 10 lbs since April.  Pt said her pcp did labs yesterday and cbc was all abnormal.  Pt was told to come to the ED for further eval.  She denies easy bruising or bleeding.  No black stools.  She denies any pain.       Home Medications Prior to Admission medications   Medication Sig Start Date End Date Taking? Authorizing Provider  ALPRAZolam Prudy Feeler) 0.25 MG tablet TAKE 1 TABLET (0.25 MG TOTAL) BY MOUTH DAILY AS NEEDED. 05/12/22   [name], MD  aspirin 325 MG EC tablet Take 325 mg by mouth 3 (three) times a week.    [provider]  Calcium-Magnesium-Vitamin D 600-300-400 LIQD Take by mouth.    [provider]  Cyanocobalamin (VITAMIN B-12 PO) Take 1,000 mcg by mouth daily.    [provider]  levothyroxine (SYNTHROID) 50 MCG tablet Take 1 tablet (50 mcg total) by mouth daily. 11/07/21   [name], MD  Red Yeast Rice 600 MG TABS Take 1 tablet (600 mg total) by mouth in the morning and at bedtime. 05/17/22   [name], MD      Allergies    Ambien [zolpidem], Codeine, Hydrocodone, Statins, Sulfa antibiotics, and Zofran [ondansetron]    Review of Systems   Review of Systems  Constitutional:  Positive for fatigue and unexpected weight change.  All other systems reviewed and are negative.   Physical Exam Updated Vital Signs BP (!) 166/74 (BP Location: Left Arm)   Pulse 78   Temp 98.7 F (37.1 C) (Oral)   Resp 17   Ht 5\' 4"  (1.626 m)   Wt 54.4 kg   LMP 01/19/1992 (Exact Date)   SpO2 99%   BMI  20.60 kg/m  Physical Exam Vitals and nursing note reviewed.  Constitutional:      Appearance: Normal appearance.  HENT:     Head: Normocephalic and atraumatic.     Right Ear: External ear normal.     Left Ear: External ear normal.     Nose: Nose normal.     Mouth/Throat:     Mouth: Mucous membranes are moist.     Pharynx: Oropharynx is clear.  Eyes:     Extraocular Movements: Extraocular movements intact.     Conjunctiva/sclera: Conjunctivae normal.     Pupils: Pupils are equal, round, and reactive to light.  Cardiovascular:     Rate and Rhythm: Normal rate and regular rhythm.     Pulses: Normal pulses.     Heart sounds: Normal heart sounds.  Pulmonary:     Effort: Pulmonary effort is normal.     Breath sounds: Normal breath sounds.  Abdominal:     General: Abdomen is flat. Bowel sounds are normal.     Palpations: Abdomen is soft.  Musculoskeletal:        General: Normal range of motion.     Cervical back: Normal range of motion and neck supple.  Skin:    General:  Skin is warm.     Capillary Refill: Capillary refill takes less than 2 seconds.  Neurological:     General: No focal deficit present.     Mental Status: She is alert and oriented to person, place, and time.  Psychiatric:        Mood and Affect: Mood normal.        Behavior: Behavior normal.     ED Results / Procedures / Treatments   Labs (all labs ordered are listed, but only abnormal results are displayed) Labs Reviewed  COMPREHENSIVE METABOLIC PANEL  CBC WITH DIFFERENTIAL/PLATELET  URINALYSIS, ROUTINE W REFLEX MICROSCOPIC  PROTIME-INR  TYPE AND SCREEN    EKG EKG Interpretation Date/Time:  Wednesday August 09 2022 14:36:54 EDT Ventricular Rate:  66 PR Interval:  173 QRS Duration:  90 QT Interval:  429 QTC Calculation: 450 R Axis:   58  Text Interpretation: Sinus rhythm Low voltage, extremity and precordial leads Minimal ST depression, inferior leads No significant change since last tracing  Confirmed by Jacalyn Lefevre [phone]) on 08/09/2022 2:43:57 PM  Radiology No results found.  Procedures Procedures    Medications Ordered in ED Medications  sodium chloride 0.9 % bolus 1,000 mL (1,000 mLs Intravenous New Bag/Given 08/09/22 1459)    ED Course/ Medical Decision Making/ A&P                             Medical Decision Making Amount and/or Complexity of Data Reviewed Labs: ordered.   This patient presents to the ED for concern of abnormal labs, this involves an extensive number of treatment options, and is a complaint that carries with it a high risk of complications and morbidity.  The differential diagnosis includes lab error, anemia, thrombocytopenia   Co morbidities that complicate the patient evaluation  hypothyroidism, htn, diverticulitis and migraines   Additional history obtained:  Additional history obtained from epic chart review  Lab Tests:  I Ordered labs.  They are pending at shift change.  Cardiac Monitoring:  The patient was maintained on a cardiac monitor.  I personally viewed and interpreted the cardiac monitored which showed an underlying rhythm of: nsr   Medicines ordered and prescription drug management:  I ordered medication including ivfs  for dehydration   Reevaluation of the patient after these medicines showed that the patient improved I have reviewed the patients home medicines and have made adjustments as needed   Problem List / ED Course:  Abn labs:  labs pending at shift change   Reevaluation:  After the interventions noted above, I reevaluated the patient and found that they have :improved   Social Determinants of Health:  Lives at home   Dispostion:  Pending at shift change.        Final Clinical Impression(s) / ED Diagnoses Final diagnoses:  None    Rx / DC Orders ED Discharge Orders     None         Jacalyn Lefevre, MD 08/09/22 1528

## 2022-08-09 NOTE — Consult Note (Signed)
Hematology/Oncology Consult Note  Clinical Summary: [name] is a 82 year old female who presents with neutropenia/anemia/thrombocytopenia.   Reason for Consult: Severe Anemia/Thrombocytopenia/Neutropenia, consistent with Acute Leukemia   HPI: [name] is a 82 year old female with medical history significant for hypothyroidism and anxiety who presents for evaluation of severe pancytopenia.  The patient was in her normal state of health when she was evaluated by her primary care provider yesterday.  As part of that routine workup she underwent blood work which showed white blood cell 24.7, hemoglobin 8.8, MCV 97.4, and platelets of 48.  Due to concern for these findings he recommended she present to the emergency department.  On exam today [name] reports that she is lost 10 pounds in the last 3 months.  She has been having issue with a poor taste in her mouth.  She reports it has been a metallic taste.  She notes that she has not had any issues with pain, vision changes, or headache.  She reports that she is prone to easy bruising though she does take aspirin 325 mg p.o. daily.  She reports that she has been a little unsteady on her feet lately and had increasing fatigue.  She is also prone to anxiety.  She notes that she has not had any recent illnesses of any kind.  She denies runny nose, sore throat, fevers, chills, sweats, nausea, vomiting or diarrhea.  Other than fatigue and weight loss she is at her baseline level of health.  Today we discussed the results of her blood work and the finding on peripheral blood film.  This time I am deeply concerned about an acute leukemia.  I think he would be best served by transfer to an academic Medical [facility].  Recommend transfer to [facility]  The patient voiced understanding of the plan moving forward.  O:  Vitals:   08/09/22 2000 08/09/22 2123  BP: (!) 135/50 (!) 134/54  Pulse: 76 77  Resp:  16  Temp:  99.2 F  (37.3 C)  SpO2: 96% 97%      Latest Ref Rng & Units 08/09/2022    3:01 PM 08/08/2022    4:18 PM 05/16/2022   10:41 AM  CMP  Glucose 70 - 99 mg/dL 97  098  98   BUN 8 - 23 mg/dL 25  29  22    Creatinine 0.44 - 1.00 mg/dL 1.19  1.47  8.29   Sodium 135 - 145 mmol/L 138  137  142   Potassium 3.5 - 5.1 mmol/L 4.0  4.2  3.9   Chloride 98 - 111 mmol/L 103  101  104   CO2 22 - 32 mmol/L 25  27  28    Calcium 8.9 - 10.3 mg/dL 8.8  9.4  9.3   Total Protein 6.5 - 8.1 g/dL 7.1  7.6    Total Bilirubin 0.3 - 1.2 mg/dL 0.6  0.7    Alkaline Phos 38 - 126 U/L 111  115    AST 15 - 41 U/L 19  17    ALT 0 - 44 U/L 13  8        Latest Ref Rng & Units 08/09/2022    3:01 PM 08/08/2022    4:18 PM 10/30/2021    7:41 PM  CBC  WBC 4.0 - 10.5 K/uL 20.8  24.7 Repeated and verified X2.  3.3   Hemoglobin 12.0 - 15.0 g/dL 8.1  8.8  56.2   Hematocrit 36.0 -  46.0 % 26.1  27.6  41.5   Platelets 150 - 400 K/uL 33  48.0  237       GENERAL: well appearing elderly Caucasian female in NAD  SKIN: skin color, texture, turgor are normal, no rashes or significant lesions EYES: conjunctiva are pink and non-injected, sclera clear LUNGS: clear to auscultation and percussion with normal breathing effort HEART: regular rate & rhythm and no murmurs and no lower extremity edema Musculoskeletal: no cyanosis of digits and no clubbing  PSYCH: alert & oriented x 3, fluent speech NEURO: no focal motor/sensory deficits  Assessment/Plan:  # Leukocytosis # Severe Anemia/Thrombocytopenia # 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  --patient will require a bone marrow biopsy  -- patient is severely neutropenic with ANC 0.0 and Plt 33. Hgb 8.1 with WBC 20.8 (consistent with blast cells).   -- INR is 1.1, fibrinogen levels pending. Low suspicion for APML or associated DIC.  --Oncology will continue to follow   Ulysees Barns, MD Department of Hematology/Oncology [facility] [address] Phone: [phone] Pager: [phone] Email: [email or web address]

## 2022-08-09 NOTE — Telephone Encounter (Signed)
Clydie Braun with [facility] lab called critical lab for WBC 24.7 and Plt 48,000. I left v/m on Dr Para March cell since he saw pt on 08/08/22 and Dr Para March is out of office today. I will send note to Dr Para March and Dr Reece Agar and will let Schinkel CMA be aware. Info also logged in lab notebook.

## 2022-08-09 NOTE — Assessment & Plan Note (Signed)
Weight loss at this point presumed to be related to decrease in appetite/taste changes, but without clear source for those issues.  Suspect her blood pressure is lower with weight loss and is reasonable to stay off her blood pressure medication for now.  At this point still okay for outpatient follow-up.  Not orthostatic upon standing.  Check routine labs today.  Addendum.  Significantly elevated white count with abnormal hemoglobin, thrombocytopenia of unclear source.  Will recommend ER.  See phone note.  30 minutes were devoted to patient care in this encounter (this includes time spent reviewing the patient's file/history, interviewing and examining the patient, counseling/reviewing plan with patient).

## 2022-08-09 NOTE — Telephone Encounter (Signed)
Please call pt.  Advice her to go to ER.  She is clearly anemic, of unknown source, with sig elevated WBC.  She needs repeat labs and eval asap.  Thanks.

## 2022-08-09 NOTE — ED Provider Notes (Signed)
3:17 PM Patient signed out to me by previous ED physician.  Patient is a 82 year old female sent to the emergency department by her primary care physician for abnormal lab values that were drawn secondary to patient's complaints of decreased appetite, 10 pound weight loss, and generalized fatigue over the past month.  No previous history of cancers.  Laboratory studies pending.    Physical Exam  BP (!) 166/74 (BP Location: Left Arm)   Pulse 78   Temp 98.7 F (37.1 C) (Oral)   Resp 17   Ht 5\' 4"  (1.626 m)   Wt 54.4 kg   LMP 01/19/1992 (Exact Date)   SpO2 99%   BMI 20.60 kg/m   Physical Exam  Procedures  .Critical Care  Performed by: Franne Forts, DO Authorized by: Franne Forts, DO   Critical care provider statement:    Critical care time (minutes):  31   Critical care was time spent personally by me on the following activities:  Development of treatment plan with patient or surrogate, discussions with consultants, evaluation of patient's response to treatment, examination of patient, ordering and review of laboratory studies, ordering and review of radiographic studies, ordering and performing treatments and interventions, pulse oximetry, re-evaluation of patient's condition and review of old charts   Care discussed with comment:  Transfer to [facility] for acute leukemia   ED Course / MDM    Medical Decision Making Amount and/or Complexity of Data Reviewed Labs: ordered. Radiology: ordered.  Risk Prescription drug management.   Laboratory studies concerning for leukocytosis of 20.8, thrombocytopenia of 33, anemia with a hemoglobin of 8.1, and an neutrophil count of 0.  Concerns for leukemia or some other malignancy.  DIC and iron labs pending.   Spoke with our on-call hematologist oncologist Dr. Leonides Schanz who has evaluated the peripheral smear and has concerns for acute leukemia.  Recommends transfer to [facility].  Patient up-to-date and agreeable to plan.  I spoke  with* Dr. Linna Darner at [facility] who agrees to accept patient to Leukemia floor for inpatient admission. Bed is available.       Franne Forts, DO 08/09/22 2041

## 2022-08-09 NOTE — Telephone Encounter (Signed)
[facility] will send out the cbc to have a manual diff done.  And pathology review done.

## 2022-08-09 NOTE — ED Notes (Signed)
Ground ALS transport requested for IFT to [facility], Room 603, with [facility] dispatcher. Awaiting transport crew arrival at this time.

## 2022-08-09 NOTE — ED Notes (Signed)
Informed by Cristie Hem, Unit Secretary that [facility] is en route to transport pt to [facility].

## 2022-08-09 NOTE — ED Triage Notes (Addendum)
Sent after PCP called her stating her WBC are elevated and decreased plts. Pt states she has had decreased appetite and fatigue

## 2022-08-11 ENCOUNTER — Telehealth: Payer: Self-pay

## 2022-08-11 LAB — PATHOLOGIST SMEAR REVIEW

## 2022-08-11 NOTE — Telephone Encounter (Signed)
App call re: CBC review.  I asked for fax results in the meantime, in case they are not already entered in EPIC.

## 2022-08-11 NOTE — Telephone Encounter (Signed)
Dr Serena Croissant with [facility] calling to speak with Dr Para March about pt; Dr Para March speaking with Dr Serena Croissant.

## 2022-08-13 NOTE — Telephone Encounter (Signed)
Please see scanned report.  Already admitted.  Will defer to inpatient team.  Thanks.

## 2022-08-15 LAB — PATHOLOGIST SMEAR REVIEW

## 2022-08-17 ENCOUNTER — Other Ambulatory Visit: Payer: Self-pay | Admitting: *Deleted

## 2022-08-17 DIAGNOSIS — C9200 Acute myeloblastic leukemia, not having achieved remission: Secondary | ICD-10-CM

## 2022-08-22 ENCOUNTER — Telehealth: Payer: Self-pay | Admitting: *Deleted

## 2022-08-22 ENCOUNTER — Ambulatory Visit: Payer: Medicare Other

## 2022-08-22 ENCOUNTER — Telehealth: Payer: Self-pay | Admitting: Family Medicine

## 2022-08-22 NOTE — Telephone Encounter (Signed)
Pt's nephew, Casimiro Needle, called to let Dr. Para March know that after he saw the pt on 7/2, the pt was sent to [facility]. 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 Casimiro Needle didn't state the reason pt was admitted into hosp. Casimiro Needle states the pt is scheduled to be discharged hopefully by Fri, 7/19. Call back # [phone]

## 2022-08-22 NOTE — Telephone Encounter (Signed)
Received vm call from Kristi at [facility] stating pt's d/c has been delayed & trying to decide if pt will go home or rehab.  She states safe to cancel labs for this week-done-but keep mon appts in place.

## 2022-08-24 ENCOUNTER — Inpatient Hospital Stay: Payer: Medicare Other

## 2022-08-25 ENCOUNTER — Telehealth: Payer: Self-pay | Admitting: *Deleted

## 2022-08-25 DIAGNOSIS — I081 Rheumatic disorders of both mitral and tricuspid valves: Secondary | ICD-10-CM | POA: Diagnosis not present

## 2022-08-25 DIAGNOSIS — E039 Hypothyroidism, unspecified: Secondary | ICD-10-CM | POA: Diagnosis not present

## 2022-08-25 DIAGNOSIS — R7989 Other specified abnormal findings of blood chemistry: Secondary | ICD-10-CM | POA: Diagnosis not present

## 2022-08-25 DIAGNOSIS — I5181 Takotsubo syndrome: Secondary | ICD-10-CM | POA: Diagnosis not present

## 2022-08-25 DIAGNOSIS — Z7982 Long term (current) use of aspirin: Secondary | ICD-10-CM | POA: Diagnosis not present

## 2022-08-25 DIAGNOSIS — R9431 Abnormal electrocardiogram [ECG] [EKG]: Secondary | ICD-10-CM | POA: Diagnosis not present

## 2022-08-25 DIAGNOSIS — R0602 Shortness of breath: Secondary | ICD-10-CM | POA: Diagnosis not present

## 2022-08-25 DIAGNOSIS — Z882 Allergy status to sulfonamides status: Secondary | ICD-10-CM | POA: Diagnosis not present

## 2022-08-25 DIAGNOSIS — G4700 Insomnia, unspecified: Secondary | ICD-10-CM | POA: Diagnosis not present

## 2022-08-25 DIAGNOSIS — T451X5A Adverse effect of antineoplastic and immunosuppressive drugs, initial encounter: Secondary | ICD-10-CM | POA: Diagnosis not present

## 2022-08-25 DIAGNOSIS — I3139 Other pericardial effusion (noninflammatory): Secondary | ICD-10-CM | POA: Diagnosis not present

## 2022-08-25 DIAGNOSIS — I10 Essential (primary) hypertension: Secondary | ICD-10-CM | POA: Diagnosis not present

## 2022-08-25 DIAGNOSIS — I5021 Acute systolic (congestive) heart failure: Secondary | ICD-10-CM | POA: Diagnosis not present

## 2022-08-25 DIAGNOSIS — Z79899 Other long term (current) drug therapy: Secondary | ICD-10-CM | POA: Diagnosis not present

## 2022-08-25 DIAGNOSIS — D61818 Other pancytopenia: Secondary | ICD-10-CM | POA: Diagnosis not present

## 2022-08-25 DIAGNOSIS — Z87891 Personal history of nicotine dependence: Secondary | ICD-10-CM | POA: Diagnosis not present

## 2022-08-25 DIAGNOSIS — I5040 Unspecified combined systolic (congestive) and diastolic (congestive) heart failure: Secondary | ICD-10-CM | POA: Diagnosis not present

## 2022-08-25 DIAGNOSIS — R0902 Hypoxemia: Secondary | ICD-10-CM | POA: Diagnosis not present

## 2022-08-25 DIAGNOSIS — Z20822 Contact with and (suspected) exposure to covid-19: Secondary | ICD-10-CM | POA: Diagnosis not present

## 2022-08-25 DIAGNOSIS — Z8249 Family history of ischemic heart disease and other diseases of the circulatory system: Secondary | ICD-10-CM | POA: Diagnosis not present

## 2022-08-25 DIAGNOSIS — R079 Chest pain, unspecified: Secondary | ICD-10-CM | POA: Diagnosis not present

## 2022-08-25 DIAGNOSIS — C9200 Acute myeloblastic leukemia, not having achieved remission: Secondary | ICD-10-CM | POA: Diagnosis not present

## 2022-08-25 DIAGNOSIS — J90 Pleural effusion, not elsewhere classified: Secondary | ICD-10-CM | POA: Diagnosis not present

## 2022-08-25 DIAGNOSIS — I272 Pulmonary hypertension, unspecified: Secondary | ICD-10-CM | POA: Diagnosis not present

## 2022-08-25 DIAGNOSIS — Z885 Allergy status to narcotic agent status: Secondary | ICD-10-CM | POA: Diagnosis not present

## 2022-08-25 DIAGNOSIS — I5082 Biventricular heart failure: Secondary | ICD-10-CM | POA: Diagnosis not present

## 2022-08-25 DIAGNOSIS — Z825 Family history of asthma and other chronic lower respiratory diseases: Secondary | ICD-10-CM | POA: Diagnosis not present

## 2022-08-25 DIAGNOSIS — Z833 Family history of diabetes mellitus: Secondary | ICD-10-CM | POA: Diagnosis not present

## 2022-08-25 DIAGNOSIS — D61810 Antineoplastic chemotherapy induced pancytopenia: Secondary | ICD-10-CM | POA: Diagnosis not present

## 2022-08-25 DIAGNOSIS — Z888 Allergy status to other drugs, medicaments and biological substances status: Secondary | ICD-10-CM | POA: Diagnosis not present

## 2022-08-25 NOTE — Transitions of Care (Post Inpatient/ED Visit) (Signed)
   08/25/2022  Name: [name] MRN: [id number] DOB: 18-Mar-1940  Today's TOC FU Call Status: Today's TOC FU Call Status:: Unsuccessul Call (1st Attempt) Unsuccessful Call (1st Attempt) Date: 08/25/22  Attempted to reach the patient regarding the most recent Inpatient/ED visit.  Follow Up Plan: Additional outreach attempts will be made to reach the patient to complete the Transitions of Care (Post Inpatient/ED visit) call.   Gean Maidens BSN RN [facility] [phone]

## 2022-08-28 ENCOUNTER — Inpatient Hospital Stay: Payer: Medicare Other

## 2022-08-28 ENCOUNTER — Telehealth: Payer: Self-pay

## 2022-08-28 ENCOUNTER — Telehealth: Payer: Self-pay | Admitting: *Deleted

## 2022-08-28 NOTE — Telephone Encounter (Signed)
Noted, I am awaiting inpatient notes. Did they check with hospital team about their concerns?  Thanks.

## 2022-08-28 NOTE — Telephone Encounter (Signed)
I was unable to reach family on DPR and I spoke with [facility] and pt is presently admitted there. Sending note to Dr Para March and Para March pool.

## 2022-08-28 NOTE — Telephone Encounter (Signed)
Received call from Maureen Ralphs, RN, Heme.Navigator @ [facility]. She is calling to cancel pt's lab and transfusion appt as pt has been re-admitted @ [facility] for SOB, pleural effusion. She will call on Wednesday regarding pt's lab appt on Thursday.  Appts cancelled for today

## 2022-08-28 NOTE — Telephone Encounter (Signed)
Called no answer no voicemail

## 2022-08-30 ENCOUNTER — Telehealth: Payer: Self-pay | Admitting: *Deleted

## 2022-08-30 NOTE — Telephone Encounter (Signed)
Received call from Maureen Ralphs, nurse navigator @ [facility]. She states that pt remains in the hospital and advised to cancel her appt here tomorrow. She is to have bone marrow biopsy on 09/04/22  Appts cancelled.

## 2022-08-31 ENCOUNTER — Inpatient Hospital Stay: Payer: Medicare Other

## 2022-08-31 ENCOUNTER — Telehealth: Payer: Self-pay | Admitting: Hematology and Oncology

## 2022-09-01 ENCOUNTER — Telehealth: Payer: Self-pay | Admitting: *Deleted

## 2022-09-01 NOTE — Telephone Encounter (Signed)
Received call from Dr. Lowella Bandy regarding pt and her upcoming appts here for labs and transfusions. Pt had recently been re-admitted for heart failure, pleural effusions and decreased EF from 55% to 20-25 %. Pt has been discharged and is expected to come here on Monday for labs from PICC line and possible transfusions. Dr. Lowella Bandy expects pt to need platelets 2 x a week and PRBC's 1 x a week.  Due to her cardiac issues, pt will likely need Lasix 20 mg IV prior to transfusions. Informed Dr. Lowella Bandy that pt is scheduled for labs/transfusions on Mondays and Thursdays. Dr. Lowella Bandy advised that pt will return to [facility] on 8/12 /24 for bone marrow biopsy and to see her as well.  Dr. Leonides Schanz advised of the above. Dr. Lowella Bandy asking to speak with Dr. Leonides Schanz. Provided her cell # to Dr. Leonides Schanz

## 2022-09-01 NOTE — Transitions of Care (Post Inpatient/ED Visit) (Signed)
   09/01/2022  Name: Lun Breit MRN: [id number] DOB: Dec 20, 1940  Today's TOC FU Call Status: Unsuccessful Call (1st Attempt) Date: 09/01/22  Attempted to reach the patient regarding the most recent Inpatient/ED visit.  Follow Up Plan: Additional outreach attempts will be made to reach the patient to complete the Transitions of Care (Post Inpatient/ED visit) call.   Gean Maidens BSN RN [facility] [phone]

## 2022-09-04 ENCOUNTER — Inpatient Hospital Stay: Payer: Medicare Other

## 2022-09-04 ENCOUNTER — Other Ambulatory Visit: Payer: Self-pay

## 2022-09-04 ENCOUNTER — Ambulatory Visit: Payer: Medicare Other

## 2022-09-04 ENCOUNTER — Inpatient Hospital Stay: Payer: Medicare Other | Attending: Hematology and Oncology

## 2022-09-04 VITALS — BP 129/94 | HR 70 | Temp 98.9°F | Resp 16

## 2022-09-04 DIAGNOSIS — C9200 Acute myeloblastic leukemia, not having achieved remission: Secondary | ICD-10-CM | POA: Diagnosis not present

## 2022-09-04 LAB — CMP (CANCER CENTER ONLY)
ALT: 18 U/L (ref 0–44)
AST: 24 U/L (ref 15–41)
Albumin: 3.3 g/dL — ABNORMAL LOW (ref 3.5–5.0)
Alkaline Phosphatase: 129 U/L — ABNORMAL HIGH (ref 38–126)
Anion gap: 9 (ref 5–15)
BUN: 14 mg/dL (ref 8–23)
CO2: 25 mmol/L (ref 22–32)
Calcium: 8.9 mg/dL (ref 8.9–10.3)
Chloride: 107 mmol/L (ref 98–111)
Creatinine: 0.9 mg/dL (ref 0.44–1.00)
GFR, Estimated: >60 mL/min (ref >60)
Glucose, Bld: 93 mg/dL (ref 70–99)
Potassium: 3.4 mmol/L — ABNORMAL LOW (ref 3.5–5.1)
Sodium: 141 mmol/L (ref 135–145)
Total Bilirubin: 1.2 mg/dL (ref 0.3–1.2)
Total Protein: 5.8 g/dL — ABNORMAL LOW (ref 6.5–8.1)

## 2022-09-04 LAB — CBC WITH DIFFERENTIAL (CANCER CENTER ONLY)
Abs Immature Granulocytes: 0.04 10*3/uL (ref 0.00–0.07)
Basophils Absolute: 0 10*3/uL (ref 0.0–0.1)
Basophils Relative: 0 %
Eosinophils Absolute: 0 10*3/uL (ref 0.0–0.5)
Eosinophils Relative: 0 %
HCT: 25.1 % — ABNORMAL LOW (ref 36.0–46.0)
Hemoglobin: 8.6 g/dL — ABNORMAL LOW (ref 12.0–15.0)
Immature Granulocytes: 2 %
Lymphocytes Relative: 57 %
Lymphs Abs: 1.1 10*3/uL (ref 0.7–4.0)
MCH: 30.6 pg (ref 26.0–34.0)
MCHC: 34.3 g/dL (ref 30.0–36.0)
MCV: 89.3 fL (ref 80.0–100.0)
Monocytes Absolute: 0.5 10*3/uL (ref 0.1–1.0)
Monocytes Relative: 27 %
Neutro Abs: 0.3 10*3/uL — CL (ref 1.7–7.7)
Neutrophils Relative %: 14 %
Platelet Count: 423 10*3/uL — ABNORMAL HIGH (ref 150–400)
RBC: 2.81 MIL/uL — ABNORMAL LOW (ref 3.87–5.11)
RDW: 19 % — ABNORMAL HIGH (ref 11.5–15.5)
WBC Count: 2 10*3/uL — ABNORMAL LOW (ref 4.0–10.5)
nRBC: 0 % (ref 0.0–0.2)

## 2022-09-04 LAB — SAMPLE TO BLOOD BANK

## 2022-09-04 LAB — MAGNESIUM: Magnesium: 1.8 mg/dL (ref 1.7–2.4)

## 2022-09-04 MED ORDER — SODIUM CHLORIDE 0.9% FLUSH
10.0000 mL | Freq: Once | INTRAVENOUS | Status: AC
  Administered 2022-09-04: 10 mL

## 2022-09-05 ENCOUNTER — Telehealth: Payer: Self-pay | Admitting: Hematology and Oncology

## 2022-09-06 ENCOUNTER — Telehealth: Payer: Self-pay | Admitting: *Deleted

## 2022-09-06 NOTE — Transitions of Care (Post Inpatient/ED Visit) (Addendum)
09/06/2022  Name: [name] MRN: [id number] DOB: 1940/10/04  Today's TOC FU Call Status: Today's TOC FU Call Status:: Successful TOC FU Call Competed TOC FU Call Complete Date: 09/06/22  Transition Care Management Follow-up Telephone Call Date of Discharge: 08/31/22 Discharge Facility: Other ([facility]) Name of Other ([facility]) Discharge Facility: [facility] Type of Discharge: Inpatient Admission Primary Inpatient Discharge Diagnosis:: Acute Myeloid Leukemia How have you been since you were released from the hospital?:  (Real tired still some shortness of breath) Any questions or concerns?: Yes Patient Questions/Concerns:: should she take the extra lasix. I went over the lasix order Patient Questions/Concerns Addressed: Other: (if 5 lbs within a week take the extra lasix per order.)  Items Reviewed: Did you receive and understand the discharge instructions provided?: Yes Medications obtained,verified, and reconciled?: Yes (Medications Reviewed) Any new allergies since your discharge?: No Dietary orders reviewed?: Yes Type of Diet Ordered:: low sodium heart healthy Do you have support at home?: Yes People in Home: alone Name of Support/Comfort Primary Source: Doris sister and nephew  Medications Reviewed Today: Medications Reviewed Today     Reviewed by Luella Cook, RN (Case Manager) on 09/06/22 at 1029  Med List Status: <None>   Medication Order Taking? Sig Documenting Provider Last Dose Status Informant  ALPRAZolam (XANAX) 0.25 MG tablet [id number] Yes TAKE 1 TABLET (0.25 MG TOTAL) BY MOUTH DAILY AS NEEDED. [name], MD Taking Active   aspirin 325 MG EC tablet [id number] Yes Take 325 mg by mouth 3 (three) times a week. [provider] Taking Active   Calcium-Magnesium-Vitamin D 600-300-400 LIQD [id number] Yes Take by mouth. [provider] Taking Active   Cyanocobalamin (VITAMIN B-12 PO) [id number] Yes Take 1,000 mcg by mouth  daily. [provider] Taking Active   levothyroxine (SYNTHROID) 50 MCG tablet [id number] Yes Take 1 tablet (50 mcg total) by mouth daily. [name], MD Taking Active   Red Yeast Rice 600 MG TABS [id number] Yes Take 1 tablet (600 mg total) by mouth in the morning and at bedtime. [name], MD Taking Active             Home Care and Equipment/Supplies: Were Home Health Services Ordered?: Yes Name of Home Health Agency:: [facility] Has Agency set up a time to come to your home?: No EMR reviewed for Home Health Orders: Orders present/patient has not received call (refer to CM for follow-up) (RN gave the patient the number to f/u with [facility]) Any new equipment or medical supplies ordered?: NA  Functional Questionnaire: Do you need assistance with bathing/showering or dressing?: No Do you need assistance with meal preparation?: Yes Do you need assistance with eating?: No Do you have difficulty maintaining continence: No Do you need assistance with getting out of bed/getting out of a chair/moving?: No Do you have difficulty managing or taking your medications?: No  Follow up appointments reviewed: PCP Follow-up appointment confirmed?: NA Specialist Hospital Follow-up appointment confirmed?: Yes Date of Specialist follow-up appointment?: 09/04/22 Follow-Up Specialty Provider:: [id number] oncologist, [id number] Dr Leonides Schanz, [id number] Dr Lowella Bandy Do you need transportation to your follow-up appointment?: No Do you understand care options if your condition(s) worsen?: Yes-patient verbalized understanding  SDOH Interventions Today    Flowsheet Row Most Recent Value  SDOH Interventions   Food Insecurity Interventions Intervention Not Indicated  Housing Interventions Intervention Not Indicated  Transportation Interventions Intervention Not Indicated, Patient Resources (Friends/Family)      Interventions Today    Flowsheet Row Most Recent  Value  Chronic Disease   Chronic  disease during today's visit Congestive Heart Failure (CHF), Other  [AML]  General Interventions   General Interventions Discussed/Reviewed General Interventions Discussed, General Interventions Reviewed, Referral to Nurse, Doctor Visits, Community Resources  [facility] discussed Medical alert, U[employer] meal plan]  Doctor Visits Discussed/Reviewed Doctor Visits Discussed, Doctor Visits Reviewed  Nutrition Interventions   Nutrition Discussed/Reviewed Decreasing salt, Nutrition Discussed, Nutrition Reviewed  Pharmacy Interventions   Pharmacy Dicussed/Reviewed Pharmacy Topics Discussed, Pharmacy Topics Reviewed      TOC Interventions Today    Flowsheet Row Most Recent Value  TOC Interventions   TOC Interventions Discussed/Reviewed TOC Interventions Discussed, TOC Interventions Reviewed     Referred to care coordinator appt [name] 1610 3 pm  Gean Maidens BSN RN [facility] [phone]

## 2022-09-07 ENCOUNTER — Other Ambulatory Visit: Payer: Self-pay

## 2022-09-07 ENCOUNTER — Inpatient Hospital Stay: Payer: Medicare Other | Attending: Hematology and Oncology | Admitting: Hematology and Oncology

## 2022-09-07 ENCOUNTER — Inpatient Hospital Stay: Payer: Medicare Other

## 2022-09-07 ENCOUNTER — Ambulatory Visit: Payer: Medicare Other

## 2022-09-07 VITALS — BP 133/72 | HR 61 | Temp 97.8°F | Resp 13 | Wt 123.6 lb

## 2022-09-07 DIAGNOSIS — C9200 Acute myeloblastic leukemia, not having achieved remission: Secondary | ICD-10-CM | POA: Diagnosis not present

## 2022-09-07 DIAGNOSIS — T82898A Other specified complication of vascular prosthetic devices, implants and grafts, initial encounter: Secondary | ICD-10-CM

## 2022-09-07 DIAGNOSIS — Z87891 Personal history of nicotine dependence: Secondary | ICD-10-CM | POA: Insufficient documentation

## 2022-09-07 DIAGNOSIS — R0602 Shortness of breath: Secondary | ICD-10-CM | POA: Insufficient documentation

## 2022-09-07 LAB — CBC WITH DIFFERENTIAL (CANCER CENTER ONLY)
Abs Immature Granulocytes: 0.1 10*3/uL — ABNORMAL HIGH (ref 0.00–0.07)
Basophils Absolute: 0 10*3/uL (ref 0.0–0.1)
Basophils Relative: 1 %
Eosinophils Absolute: 0 10*3/uL (ref 0.0–0.5)
Eosinophils Relative: 0 %
HCT: 25.7 % — ABNORMAL LOW (ref 36.0–46.0)
Hemoglobin: 8.7 g/dL — ABNORMAL LOW (ref 12.0–15.0)
Immature Granulocytes: 3 %
Lymphocytes Relative: 32 %
Lymphs Abs: 1 10*3/uL (ref 0.7–4.0)
MCH: 31.1 pg (ref 26.0–34.0)
MCHC: 33.9 g/dL (ref 30.0–36.0)
MCV: 91.8 fL (ref 80.0–100.0)
Monocytes Absolute: 1 10*3/uL (ref 0.1–1.0)
Monocytes Relative: 35 %
Neutro Abs: 0.9 10*3/uL — ABNORMAL LOW (ref 1.7–7.7)
Neutrophils Relative %: 29 %
Platelet Count: 623 10*3/uL — ABNORMAL HIGH (ref 150–400)
RBC: 2.8 MIL/uL — ABNORMAL LOW (ref 3.87–5.11)
RDW: 20.3 % — ABNORMAL HIGH (ref 11.5–15.5)
WBC Count: 3 10*3/uL — ABNORMAL LOW (ref 4.0–10.5)
nRBC: 0 % (ref 0.0–0.2)

## 2022-09-07 LAB — CMP (CANCER CENTER ONLY)
ALT: 21 U/L (ref 0–44)
AST: 29 U/L (ref 15–41)
Albumin: 3.3 g/dL — ABNORMAL LOW (ref 3.5–5.0)
Alkaline Phosphatase: 124 U/L (ref 38–126)
Anion gap: 7 (ref 5–15)
BUN: 12 mg/dL (ref 8–23)
CO2: 26 mmol/L (ref 22–32)
Calcium: 8.8 mg/dL — ABNORMAL LOW (ref 8.9–10.3)
Chloride: 109 mmol/L (ref 98–111)
Creatinine: 0.82 mg/dL (ref 0.44–1.00)
GFR, Estimated: >60 mL/min (ref >60)
Glucose, Bld: 108 mg/dL — ABNORMAL HIGH (ref 70–99)
Potassium: 3.9 mmol/L (ref 3.5–5.1)
Sodium: 142 mmol/L (ref 135–145)
Total Bilirubin: 0.9 mg/dL (ref 0.3–1.2)
Total Protein: 6 g/dL — ABNORMAL LOW (ref 6.5–8.1)

## 2022-09-07 LAB — SAMPLE TO BLOOD BANK

## 2022-09-07 LAB — MAGNESIUM: Magnesium: 2 mg/dL (ref 1.7–2.4)

## 2022-09-07 MED ORDER — ALTEPLASE 2 MG IJ SOLR
2.0000 mg | Freq: Once | INTRAMUSCULAR | Status: AC
  Administered 2022-09-07: 2 mg
  Filled 2022-09-07: qty 2

## 2022-09-07 MED ORDER — SODIUM CHLORIDE 0.9% FLUSH
10.0000 mL | Freq: Once | INTRAVENOUS | Status: AC
  Administered 2022-09-07: 10 mL

## 2022-09-07 NOTE — Progress Notes (Signed)
Call made to [facility] regarding pt's PICC and that the purple lumen does not flush. Pt's PICC line was placed at [facility] and their radiology dept will address this.  Also asked for Dr. Lowella Bandy 's team to fax current medication list and also have replacement PICC line caps sent with pt for [place] to use. Fax'd today's lab results to Dr. Lowella Bandy office.  No need for transfusions today.

## 2022-09-07 NOTE — Progress Notes (Signed)
[facility] Telephone:([phone]   [phone]  PROGRESS NOTE  Patient Care Team: [name], MD as PCP - General (Family Medicine)  Hematological/Oncological History # Acute Myeloid Leukemia  08/09/2022: seen in [facility] emergency department for leukocytosis and anemia/thrombocytopenia. 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 Transferred to [facility] 7/3-7/18/2024: Admitted to [facility], treated with Azacytidine and Venetoclax. Course complicated by neutropenic fever.  7/19-7/25/2024: admitted for fluid overload/heart failure.    Interval History:  [name] 82 y.o. female with medical history significant for AML who presents for a follow up visit. The patient's last visit was on 08/09/2022 at which time she established care. In the interim since the last visit she was transferred to [facility] where she received inpatient treatment under the care of Dr. Lowella Bandy.   On exam today Mrs. Frick reports that she is a little bit more weak and short of breath and "feels tired all the time".  She notes that she is currently off the venetoclax therapy and is being evaluated for heart failure.  She notes that she is not having any chest pain, lightheadedness, or dizziness.  She notes that she had a good experience with the staff at [facility] but does feel like this has been a long and complicated treatment course.  She currently denies any fevers, chills, sweats, nausea vomiting or diarrhea.  A full 10 point ROS is otherwise negative.  MEDICAL HISTORY:  Past Medical History:  Diagnosis Date   Diverticulitis    Former smoker    Hypertension    Lateral meniscus tear 2017   left knee   Migraines    Osteoarthritis    Osteopenia    TGA (transient global amnesia)    secondary to [place]   Thyroid disease     SURGICAL HISTORY: Past Surgical History:  Procedure Laterality Date   DILATION AND CURETTAGE OF UTERUS     menorrhagia   KNEE ARTHROSCOPY Right 10/06   TUBAL LIGATION       SOCIAL HISTORY: Social History   Socioeconomic History   Marital status: Divorced    Spouse name: Not on file   Number of children: 0   Years of education: Not on file   Highest education level: Not on file  Occupational History    Employer: [employer]  Tobacco Use   Smoking status: Former   Smokeless tobacco: Never  Vaping Use   Vaping status: Never Used  Substance and Sexual Activity   Alcohol use: Yes    Comment: 1 every  few months   Drug use: No   Sexual activity: Not Currently    Partners: Male    Birth control/protection: Post-menopausal  Other Topics Concern   Not on file  Social History Narrative   Divorced.   High school graduate.   Previously worked at [employer] at [employer].   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   Social Determinants of Health   Financial Resource Strain: Not on file  Food Insecurity: No Food Insecurity (09/06/2022)   Hunger Vital Sign    Worried About Running Out of Food in the Last Year: Never true    Ran Out of Food in the Last Year: Never true  Transportation Needs: No Transportation Needs (09/06/2022)   PRAPARE - Administrator, Civil Service (Medical): No    Lack of Transportation (Non-Medical): No  Physical Activity: Not on file  Stress: Not on file  Social Connections: Not on  file  Intimate Partner Violence: Not on file    FAMILY HISTORY: Family History  Problem Relation Age of Onset   Heart disease Mother    Diabetes Father    Heart failure Father    COPD Father    Hyperlipidemia Sister    Diabetes Brother    Colon cancer Neg Hx    Breast cancer Neg Hx     ALLERGIES:  is allergic to [place] [zolpidem], codeine, hydrocodone, statins, sulfa antibiotics, and zofran [ondansetron].  MEDICATIONS:  Current Outpatient Medications  Medication Sig Dispense Refill   acyclovir (ZOVIRAX) 400 MG tablet Take 400 mg by mouth 2 (two) times daily.     benazepril-hydrochlorthiazide (LOTENSIN HCT)  10-12.5 MG tablet Take 1 tablet by mouth daily.     benzonatate (TESSALON) 100 MG capsule Take 100 mg by mouth 3 (three) times daily as needed.     furosemide (LASIX) 20 MG tablet Take 20 mg by mouth daily. Prn for weight gain >5 lbs     granisetron (KYTRIL) 1 MG tablet Take by mouth.     levofloxacin (LEVAQUIN) 500 MG tablet Take by mouth.     ALPRAZolam (XANAX) 0.25 MG tablet TAKE 1 TABLET (0.25 MG TOTAL) BY MOUTH DAILY AS NEEDED. 30 tablet 3   aspirin 325 MG EC tablet Take 325 mg by mouth 3 (three) times a week.     Calcium-Magnesium-Vitamin D 600-300-400 LIQD Take by mouth.     Cyanocobalamin (VITAMIN B-12 PO) Take 1,000 mcg by mouth daily.     levothyroxine (SYNTHROID) 50 MCG tablet Take 1 tablet (50 mcg total) by mouth daily. 90 tablet 3   Red Yeast Rice 600 MG TABS Take 1 tablet (600 mg total) by mouth in the morning and at bedtime.     No current facility-administered medications for this visit.    REVIEW OF SYSTEMS:   Constitutional: ( - ) fevers, ( - )  chills , ( - ) night sweats Eyes: ( - ) blurriness of vision, ( - ) double vision, ( - ) watery eyes Ears, nose, mouth, throat, and face: ( - ) mucositis, ( - ) sore throat Respiratory: ( - ) cough, ( - ) dyspnea, ( - ) wheezes Cardiovascular: ( - ) palpitation, ( - ) chest discomfort, ( - ) lower extremity swelling Gastrointestinal:  ( - ) nausea, ( - ) heartburn, ( - ) change in bowel habits Skin: ( - ) abnormal skin rashes Lymphatics: ( - ) new lymphadenopathy, ( - ) easy bruising Neurological: ( - ) numbness, ( - ) tingling, ( - ) new weaknesses Behavioral/Psych: ( - ) mood change, ( - ) new changes  All other systems were reviewed with the patient and are negative.  PHYSICAL EXAMINATION: ECOG PERFORMANCE STATUS: 2 - Symptomatic, <50% confined to bed  Vitals:   09/07/22 1017  BP: 133/72  Pulse: 61  Resp: 13  Temp: 97.8 F (36.6 C)  SpO2: 99%   Filed Weights   09/07/22 1017  Weight: 123 lb 9.6 oz (56.1 kg)     GENERAL: Well-appearing elderly Caucasian female alert, no distress and comfortable SKIN: skin color, texture, turgor are normal, no rashes or significant lesions EYES: conjunctiva are pink and non-injected, sclera clear LUNGS: clear to auscultation and percussion with normal breathing effort HEART: regular rate & rhythm and no murmurs and no lower extremity edema Musculoskeletal: no cyanosis of digits and no clubbing  PSYCH: alert & oriented x 3, fluent speech NEURO: no focal  motor/sensory deficits  LABORATORY DATA:  I have reviewed the data as listed    Latest Ref Rng & Units 09/07/2022    9:46 AM 09/04/2022   12:33 PM 08/09/2022    3:01 PM  CBC  WBC 4.0 - 10.5 K/uL 3.0  2.0  20.8   Hemoglobin 12.0 - 15.0 g/dL 8.7  8.6  8.1   Hematocrit 36.0 - 46.0 % 25.7  25.1  26.1   Platelets 150 - 400 K/uL 623  423  33        Latest Ref Rng & Units 09/07/2022    9:46 AM 09/04/2022   12:33 PM 08/09/2022    3:01 PM  CMP  Glucose 70 - 99 mg/dL 035  93  97   BUN 8 - 23 mg/dL 12  14  25    Creatinine 0.44 - 1.00 mg/dL 0.09  3.81  8.29   Sodium 135 - 145 mmol/L 142  141  138   Potassium 3.5 - 5.1 mmol/L 3.9  3.4  4.0   Chloride 98 - 111 mmol/L 109  107  103   CO2 22 - 32 mmol/L 26  25  25    Calcium 8.9 - 10.3 mg/dL 8.8  8.9  8.8   Total Protein 6.5 - 8.1 g/dL 6.0  5.8  7.1   Total Bilirubin 0.3 - 1.2 mg/dL 0.9  1.2  0.6   Alkaline Phos 38 - 126 U/L 124  129  111   AST 15 - 41 U/L 29  24  19    ALT 0 - 44 U/L 21  18  13      RADIOGRAPHIC STUDIES: No results found.  ASSESSMENT & PLAN Wynnie Pacetti 82 y.o. female with medical history significant for AML who presents for a follow up visit.  Today we discussed the concept of comanaged care.  Comanaged care is when the patient has a local primary provider who administers local support and therapy while expert advice and treatment recommendations are rendered by a cancer specialist at a large academic center.  In this arrangement we provide  local support, labs, treatment, and emergency visits, however the major decisions regarding the course of treatment are decided by a physician at an academic medical center.  The patient voiced her understanding of comanaged care and was agreeable to proceeding forward with this care model.  # Acute Myeloid Leukemia  -- Patient currently follows with Dr. Lowella Bandy at [facility].  Her venetoclax therapy is currently on hold. -- We will provide supportive care as requested with labs on Mondays and Thursdays with transfusion as necessary -- Labs today show white blood cell count 3.0, hemoglobin 8.7, MCV 91.8, and platelets of 623 -- Further treatment to be deferred to Dr. Lowella Bandy.  Patient will be meeting with her later this month -- Recommend twice weekly lab checks with a return clinic visit in 4 weeks time  No orders of the defined types were placed in this encounter.   All questions were answered. The patient knows to call the clinic with any problems, questions or concerns.  A total of more than 30 minutes were spent on this encounter with face-to-face time and non-face-to-face time, including preparing to see the patient, ordering tests and/or medications, counseling the patient and coordination of care as outlined above.   Ulysees Barns, MD Department of Hematology/Oncology [facility] Phone: [phone] Pager: [phone] Email: [email or web address]  09/10/2022 8:00 PM

## 2022-09-10 ENCOUNTER — Encounter: Payer: Self-pay | Admitting: Hematology and Oncology

## 2022-09-11 ENCOUNTER — Inpatient Hospital Stay: Payer: Medicare Other

## 2022-09-11 ENCOUNTER — Other Ambulatory Visit: Payer: Self-pay

## 2022-09-11 DIAGNOSIS — C9200 Acute myeloblastic leukemia, not having achieved remission: Secondary | ICD-10-CM | POA: Diagnosis not present

## 2022-09-11 DIAGNOSIS — Z87891 Personal history of nicotine dependence: Secondary | ICD-10-CM | POA: Diagnosis not present

## 2022-09-11 DIAGNOSIS — R0602 Shortness of breath: Secondary | ICD-10-CM | POA: Diagnosis not present

## 2022-09-11 LAB — CBC WITH DIFFERENTIAL (CANCER CENTER ONLY)
Abs Immature Granulocytes: 0.09 10*3/uL — ABNORMAL HIGH (ref 0.00–0.07)
Basophils Absolute: 0.1 10*3/uL (ref 0.0–0.1)
Basophils Relative: 2 %
Eosinophils Absolute: 0 10*3/uL (ref 0.0–0.5)
Eosinophils Relative: 0 %
HCT: 29.9 % — ABNORMAL LOW (ref 36.0–46.0)
Hemoglobin: 9.9 g/dL — ABNORMAL LOW (ref 12.0–15.0)
Immature Granulocytes: 3 %
Lymphocytes Relative: 28 %
Lymphs Abs: 0.8 10*3/uL (ref 0.7–4.0)
MCH: 30.7 pg (ref 26.0–34.0)
MCHC: 33.1 g/dL (ref 30.0–36.0)
MCV: 92.9 fL (ref 80.0–100.0)
Monocytes Absolute: 1 10*3/uL (ref 0.1–1.0)
Monocytes Relative: 33 %
Neutro Abs: 1 10*3/uL — ABNORMAL LOW (ref 1.7–7.7)
Neutrophils Relative %: 34 %
Platelet Count: 568 10*3/uL — ABNORMAL HIGH (ref 150–400)
RBC: 3.22 MIL/uL — ABNORMAL LOW (ref 3.87–5.11)
RDW: 21.5 % — ABNORMAL HIGH (ref 11.5–15.5)
WBC Count: 3 10*3/uL — ABNORMAL LOW (ref 4.0–10.5)
nRBC: 0 % (ref 0.0–0.2)

## 2022-09-11 LAB — CMP (CANCER CENTER ONLY)
ALT: 23 U/L (ref 0–44)
AST: 35 U/L (ref 15–41)
Albumin: 3.4 g/dL — ABNORMAL LOW (ref 3.5–5.0)
Alkaline Phosphatase: 130 U/L — ABNORMAL HIGH (ref 38–126)
Anion gap: 5 (ref 5–15)
BUN: 8 mg/dL (ref 8–23)
CO2: 27 mmol/L (ref 22–32)
Calcium: 8.8 mg/dL — ABNORMAL LOW (ref 8.9–10.3)
Chloride: 109 mmol/L (ref 98–111)
Creatinine: 0.87 mg/dL (ref 0.44–1.00)
GFR, Estimated: >60 mL/min (ref >60)
Glucose, Bld: 92 mg/dL (ref 70–99)
Potassium: 3.9 mmol/L (ref 3.5–5.1)
Sodium: 141 mmol/L (ref 135–145)
Total Bilirubin: 0.8 mg/dL (ref 0.3–1.2)
Total Protein: 6.2 g/dL — ABNORMAL LOW (ref 6.5–8.1)

## 2022-09-11 LAB — SAMPLE TO BLOOD BANK

## 2022-09-11 LAB — MAGNESIUM: Magnesium: 1.8 mg/dL (ref 1.7–2.4)

## 2022-09-11 MED ORDER — SODIUM CHLORIDE 0.9% FLUSH
10.0000 mL | Freq: Once | INTRAVENOUS | Status: AC
  Administered 2022-09-11: 10 mL

## 2022-09-11 NOTE — Progress Notes (Signed)
Per transfusion guidelines, patient does not need blood or platelets today per [facility]. [facility] RN

## 2022-09-12 ENCOUNTER — Inpatient Hospital Stay: Payer: Medicare Other | Admitting: Family Medicine

## 2022-09-14 ENCOUNTER — Inpatient Hospital Stay: Payer: Medicare Other

## 2022-09-14 ENCOUNTER — Other Ambulatory Visit: Payer: Self-pay

## 2022-09-14 DIAGNOSIS — C9200 Acute myeloblastic leukemia, not having achieved remission: Secondary | ICD-10-CM

## 2022-09-14 DIAGNOSIS — Z87891 Personal history of nicotine dependence: Secondary | ICD-10-CM | POA: Diagnosis not present

## 2022-09-14 DIAGNOSIS — R0602 Shortness of breath: Secondary | ICD-10-CM | POA: Diagnosis not present

## 2022-09-14 LAB — SAMPLE TO BLOOD BANK

## 2022-09-14 LAB — CBC WITH DIFFERENTIAL (CANCER CENTER ONLY)
Abs Immature Granulocytes: 0.09 10*3/uL — ABNORMAL HIGH (ref 0.00–0.07)
Basophils Absolute: 0.1 10*3/uL (ref 0.0–0.1)
Basophils Relative: 1 %
Eosinophils Absolute: 0 10*3/uL (ref 0.0–0.5)
Eosinophils Relative: 0 %
HCT: 32.5 % — ABNORMAL LOW (ref 36.0–46.0)
Hemoglobin: 10.5 g/dL — ABNORMAL LOW (ref 12.0–15.0)
Immature Granulocytes: 2 %
Lymphocytes Relative: 35 %
Lymphs Abs: 1.9 10*3/uL (ref 0.7–4.0)
MCH: 30.3 pg (ref 26.0–34.0)
MCHC: 32.3 g/dL (ref 30.0–36.0)
MCV: 93.9 fL (ref 80.0–100.0)
Monocytes Absolute: 1.2 10*3/uL — ABNORMAL HIGH (ref 0.1–1.0)
Monocytes Relative: 21 %
Neutro Abs: 2.2 10*3/uL (ref 1.7–7.7)
Neutrophils Relative %: 41 %
Platelet Count: 557 10*3/uL — ABNORMAL HIGH (ref 150–400)
RBC: 3.46 MIL/uL — ABNORMAL LOW (ref 3.87–5.11)
RDW: 21.9 % — ABNORMAL HIGH (ref 11.5–15.5)
WBC Count: 5.5 10*3/uL (ref 4.0–10.5)
nRBC: 0 % (ref 0.0–0.2)

## 2022-09-14 LAB — CMP (CANCER CENTER ONLY)
ALT: 22 U/L (ref 0–44)
AST: 32 U/L (ref 15–41)
Albumin: 3.5 g/dL (ref 3.5–5.0)
Alkaline Phosphatase: 123 U/L (ref 38–126)
Anion gap: 3 — ABNORMAL LOW (ref 5–15)
BUN: 11 mg/dL (ref 8–23)
CO2: 31 mmol/L (ref 22–32)
Calcium: 8.9 mg/dL (ref 8.9–10.3)
Chloride: 109 mmol/L (ref 98–111)
Creatinine: 0.81 mg/dL (ref 0.44–1.00)
GFR, Estimated: >60 mL/min (ref >60)
Glucose, Bld: 89 mg/dL (ref 70–99)
Potassium: 4.1 mmol/L (ref 3.5–5.1)
Sodium: 143 mmol/L (ref 135–145)
Total Bilirubin: 0.7 mg/dL (ref 0.3–1.2)
Total Protein: 6.3 g/dL — ABNORMAL LOW (ref 6.5–8.1)

## 2022-09-14 LAB — MAGNESIUM: Magnesium: 1.9 mg/dL (ref 1.7–2.4)

## 2022-09-14 MED ORDER — SODIUM CHLORIDE 0.9% FLUSH
10.0000 mL | Freq: Once | INTRAVENOUS | Status: AC
  Administered 2022-09-14: 10 mL

## 2022-09-15 ENCOUNTER — Ambulatory Visit: Payer: Self-pay

## 2022-09-15 NOTE — Patient Instructions (Signed)
Visit Information  Thank you for taking time to visit with me today. Please don't hesitate to contact me if I can be of assistance to you.   Following are the goals we discussed today:   Goals Addressed             This Visit's Progress    Continued improvement post hospitalization and managment       Interventions Today    Flowsheet Row Most Recent Value  Chronic Disease   Chronic disease during today's visit Other, Congestive Heart Failure (CHF)  [Acute myeloid Leukemia]  General Interventions   General Interventions Discussed/Reviewed General Interventions Reviewed, Doctor Visits, Labs  Ada of current treatment plan for HF, Leukemia and patient adherence to plan as established by provider.  Assessed for heart failure / cancer treatment symptoms.]  Doctor Visits Discussed/Reviewed Doctor Visits Reviewed  [name] upcoming provider visits. Inquired if home health services have started.  Advised to call home health agency to notify them of discharge and request services to re-start]  Exercise Interventions   Exercise Discussed/Reviewed Physical Activity  [assessed activity level.]  Physical Activity Discussed/Reviewed Physical Activity Reviewed  Pearletha Furl patients current activity level.  Advised to continue incorporating physical activity into daily scheduleas tolerated.]  Education Interventions   Education Provided Provided Education, Provided Printed Education  [reviewed heart failure symptoms. Advised to continue to monitor weight/ BP daily and record.  Advised to notify provider for readings outside of established parameters.  Discussed patients HF action plan. Sent patient HF education article.]  Nutrition Interventions   Nutrition Discussed/Reviewed Nutrition Reviewed  Pharmacy Interventions   Pharmacy Dicussed/Reviewed Pharmacy Topics Reviewed  Algis Downs to take medications as prescribed.]              Our next appointment is by telephone on 09/27/22 at 10:30  am  Please call the care guide team at [phone] if you need to cancel or reschedule your appointment.   If you are experiencing a Mental Health or Behavioral Health Crisis or need someone to talk to, please call the Suicide and Crisis Lifeline: 988 call 1-800-273-TALK (toll free, 24 hour hotline)  Patient verbalizes understanding of instructions and care plan provided today and agrees to view in MyChart. Active MyChart status and patient understanding of how to access instructions and care plan via MyChart confirmed with patient.     [name] RN,BSN,CCM [facility] Care Coordination [phone] direct line

## 2022-09-15 NOTE — Patient Outreach (Signed)
Care Coordination   Initial Visit Note   09/15/2022 Name: [name] MRN: [id number] DOB: [birth year]  [name] is a 82 y.o. year old female who sees [name], MD for primary care. I spoke with  [name] by phone today.  What matters to the patients health and wellness today?  Patient reports being hospitalized from 08/09/22- 08/24/22 then readmitted on 08/25/22 - 08/31/22 due to SOB.   Patient states she has been doing fairly well since home.  She reports she has not notified home health that she was discharged as of yet because she's been busy with follow up appointments. Patient states she has the contact phone number for the home health agency and will call to notify.   Patient states her cancer treatment was discontinued for now.  She reports going 2 x per week to have PICC line flushed and lab draws.  Patient states she is walking better and doesn't need to use her walker inside her home for ambulation.  She reports doing leg and arm exercises 3 x per day with weights.  Patient denies having HF symptoms at this time. She states she was given a HF action plan with lasix  by provider for weight gain.  Patient reports weighing and monitoring BP daily.   She reports weight is 118 lbs and BP is 105/59.   Patient states she is eating well. She reports having good family support. She states she has a sister that lives 1 mile from her and checks on 3 x per day and a nephew that lives close. She states family transports her to her provider visits. Patient states her sister sets up and manages her medications.    Goals Addressed             This Visit's Progress    Continued improvement post hospitalization and managment       Interventions Today    Flowsheet Row Most Recent Value  Chronic Disease   Chronic disease during today's visit Other, Congestive Heart Failure (CHF)  [Acute myeloid Leukemia]  General Interventions   General Interventions Discussed/Reviewed  General Interventions Reviewed, Doctor Visits, Labs  [place] of current treatment plan for HF, Leukemia and patient adherence to plan as established by provider.  Assessed for heart failure / cancer treatment symptoms.]  Doctor Visits Discussed/Reviewed Doctor Visits Reviewed  [name] upcoming provider visits. Inquired if home health services have started.  Advised to call home health agency to notify them of discharge and request services to re-start]  Exercise Interventions   Exercise Discussed/Reviewed Physical Activity  [assessed activity level.]  Physical Activity Discussed/Reviewed Physical Activity Reviewed  Pearletha Furl patients current activity level.  Advised to continue incorporating physical activity into daily scheduleas tolerated.]  Education Interventions   Education Provided Provided Education, Provided Printed Education  [reviewed heart failure symptoms. Advised to continue to monitor weight/ BP daily and record.  Advised to notify provider for readings outside of established parameters.  Discussed patients HF action plan. Sent patient HF education article.]  Nutrition Interventions   Nutrition Discussed/Reviewed Nutrition Reviewed  Pharmacy Interventions   Pharmacy Dicussed/Reviewed Pharmacy Topics Reviewed  Algis Downs to take medications as prescribed.]              SDOH assessments and interventions completed:  Yes  SDOH Interventions Today    Flowsheet Row Most Recent Value  SDOH Interventions   Food Insecurity Interventions Intervention Not Indicated  Housing Interventions Intervention Not Indicated  Transportation Interventions Intervention  Not Indicated        Care Coordination Interventions:  Yes, provided   Follow up plan: Follow up call scheduled for 09/27/22    Encounter Outcome:  Pt. Visit Completed   [name] RN,BSN,CCM [facility] Care Coordination [phone] direct line

## 2022-09-18 ENCOUNTER — Inpatient Hospital Stay: Payer: Medicare Other

## 2022-09-18 DIAGNOSIS — D696 Thrombocytopenia, unspecified: Secondary | ICD-10-CM | POA: Diagnosis not present

## 2022-09-18 DIAGNOSIS — D709 Neutropenia, unspecified: Secondary | ICD-10-CM | POA: Diagnosis not present

## 2022-09-18 DIAGNOSIS — I3139 Other pericardial effusion (noninflammatory): Secondary | ICD-10-CM | POA: Diagnosis not present

## 2022-09-18 DIAGNOSIS — D638 Anemia in other chronic diseases classified elsewhere: Secondary | ICD-10-CM | POA: Diagnosis not present

## 2022-09-18 DIAGNOSIS — Z79899 Other long term (current) drug therapy: Secondary | ICD-10-CM | POA: Diagnosis not present

## 2022-09-18 DIAGNOSIS — I088 Other rheumatic multiple valve diseases: Secondary | ICD-10-CM | POA: Diagnosis not present

## 2022-09-18 DIAGNOSIS — I5020 Unspecified systolic (congestive) heart failure: Secondary | ICD-10-CM | POA: Diagnosis not present

## 2022-09-18 DIAGNOSIS — I361 Nonrheumatic tricuspid (valve) insufficiency: Secondary | ICD-10-CM | POA: Diagnosis not present

## 2022-09-18 DIAGNOSIS — C9200 Acute myeloblastic leukemia, not having achieved remission: Secondary | ICD-10-CM | POA: Diagnosis not present

## 2022-09-19 ENCOUNTER — Telehealth: Payer: Self-pay | Admitting: Hematology and Oncology

## 2022-09-21 ENCOUNTER — Inpatient Hospital Stay: Payer: Medicare Other

## 2022-09-21 DIAGNOSIS — I5020 Unspecified systolic (congestive) heart failure: Secondary | ICD-10-CM | POA: Diagnosis not present

## 2022-09-22 ENCOUNTER — Telehealth: Payer: Self-pay | Admitting: Family Medicine

## 2022-09-22 MED ORDER — LOPERAMIDE HCL 2 MG PO TABS: 2.0000 mg | ORAL_TABLET | Freq: Three times a day (TID) | ORAL | Status: DC | PRN

## 2022-09-22 NOTE — Telephone Encounter (Signed)
Should be okay to take loperamide 2mg  TID prn but if more diarrhea, blood in stool, no improvement, fever, abd pain, then needs eval.  Thanks.

## 2022-09-22 NOTE — Telephone Encounter (Signed)
Patient notified okay to take. Advised patient to call back for an appt if this does not help or gets worse, gets fever, abd pain or blood in stool. Patient verbalized understanding.

## 2022-09-22 NOTE — Telephone Encounter (Signed)
Pt called  in stated she got Over the counter Loperamide want to know if it  was safe to take for diarrhea  would like a call back #[phone]

## 2022-09-24 DIAGNOSIS — C9200 Acute myeloblastic leukemia, not having achieved remission: Secondary | ICD-10-CM | POA: Diagnosis not present

## 2022-09-25 ENCOUNTER — Inpatient Hospital Stay: Payer: Medicare Other

## 2022-09-25 ENCOUNTER — Telehealth: Payer: Self-pay | Admitting: *Deleted

## 2022-09-25 DIAGNOSIS — C9200 Acute myeloblastic leukemia, not having achieved remission: Secondary | ICD-10-CM

## 2022-09-25 DIAGNOSIS — R0602 Shortness of breath: Secondary | ICD-10-CM | POA: Diagnosis not present

## 2022-09-25 DIAGNOSIS — Z87891 Personal history of nicotine dependence: Secondary | ICD-10-CM | POA: Diagnosis not present

## 2022-09-25 LAB — CBC WITH DIFFERENTIAL (CANCER CENTER ONLY)
Abs Immature Granulocytes: 0.03 10*3/uL (ref 0.00–0.07)
Basophils Absolute: 0 10*3/uL (ref 0.0–0.1)
Basophils Relative: 0 %
Eosinophils Absolute: 0.5 10*3/uL (ref 0.0–0.5)
Eosinophils Relative: 6 %
HCT: 33.7 % — ABNORMAL LOW (ref 36.0–46.0)
Hemoglobin: 11 g/dL — ABNORMAL LOW (ref 12.0–15.0)
Immature Granulocytes: 0 %
Lymphocytes Relative: 21 %
Lymphs Abs: 1.7 10*3/uL (ref 0.7–4.0)
MCH: 31.2 pg (ref 26.0–34.0)
MCHC: 32.6 g/dL (ref 30.0–36.0)
MCV: 95.5 fL (ref 80.0–100.0)
Monocytes Absolute: 0.8 10*3/uL (ref 0.1–1.0)
Monocytes Relative: 9 %
Neutro Abs: 5.2 10*3/uL (ref 1.7–7.7)
Neutrophils Relative %: 64 %
Platelet Count: 260 10*3/uL (ref 150–400)
RBC: 3.53 MIL/uL — ABNORMAL LOW (ref 3.87–5.11)
RDW: 20 % — ABNORMAL HIGH (ref 11.5–15.5)
WBC Count: 8.3 10*3/uL (ref 4.0–10.5)
nRBC: 0 % (ref 0.0–0.2)

## 2022-09-25 LAB — CMP (CANCER CENTER ONLY)
ALT: 16 U/L (ref 0–44)
AST: 22 U/L (ref 15–41)
Albumin: 3.8 g/dL (ref 3.5–5.0)
Alkaline Phosphatase: 102 U/L (ref 38–126)
Anion gap: 5 (ref 5–15)
BUN: 29 mg/dL — ABNORMAL HIGH (ref 8–23)
CO2: 31 mmol/L (ref 22–32)
Calcium: 9.6 mg/dL (ref 8.9–10.3)
Chloride: 105 mmol/L (ref 98–111)
Creatinine: 0.76 mg/dL (ref 0.44–1.00)
GFR, Estimated: >60 mL/min (ref >60)
Glucose, Bld: 103 mg/dL — ABNORMAL HIGH (ref 70–99)
Potassium: 4.4 mmol/L (ref 3.5–5.1)
Sodium: 141 mmol/L (ref 135–145)
Total Bilirubin: 0.6 mg/dL (ref 0.3–1.2)
Total Protein: 6.9 g/dL (ref 6.5–8.1)

## 2022-09-25 LAB — SAMPLE TO BLOOD BANK

## 2022-09-25 LAB — MAGNESIUM: Magnesium: 1.9 mg/dL (ref 1.7–2.4)

## 2022-09-25 MED ORDER — SODIUM CHLORIDE 0.9% FLUSH
10.0000 mL | Freq: Once | INTRAVENOUS | Status: AC
  Administered 2022-09-25: 10 mL

## 2022-09-25 NOTE — Telephone Encounter (Signed)
Spoke with patient in the waiting room per request of flush nurse. Spoke with her. She states she saw Dr. Lowella Bandy last week and had a bone marrow biopsy done. She states the results were good. She does say that Dr. Lowella Bandy will be starting on chemo next week but this will be done at a satellite [facility] office in [facility]. Pt states that they will be taking her pick line out and having a port placed at the new office in the next week. Pt states she will not need to return to our office due to the above. Good wishes given to pt and advised that she can call us in the future if she needs to. Pt states she was very pleased with her care here.

## 2022-09-27 ENCOUNTER — Ambulatory Visit: Payer: Self-pay

## 2022-09-27 NOTE — Patient Outreach (Signed)
  Care Coordination   Follow Up Visit Note   09/27/2022 Name: [name] MRN: [id number] DOB: [birth year]  [name] is a 82 y.o. year old female who sees [name], MD for primary care. I spoke with  [name] by phone today.  What matters to the patients health and wellness today?  Patient states she is doing better.  Reports having follow up visit with cardiologist on 09/21/22.  States shortness of breath has improved.  Patient states she is scheduled to have a repeat ECHO in December 2024.   Patient states she is scheduled to have her PICC line removed and porta cath placed on tomorrow.  Patient states chemotherapy restarts on 10/02/22.  Patient states she was losing weight but now appetite is better and she is regaining.    Goals Addressed             This Visit's Progress    Continued improvement post hospitalization and managment       Interventions Today    Flowsheet Row Most Recent Value  Chronic Disease   Chronic disease during today's visit Congestive Heart Failure (CHF), Other  [Acute Myeloid Leukemia]  General Interventions   General Interventions Discussed/Reviewed General Interventions Reviewed, Doctor Visits, Labs  [evaluation of current treatment plan for HF, acute myeloid leukemia and patients adherence to plan as established by provider. Assessed for heart failure symptoms and any new / ongoing symptoms with AML.]  Labs --  [reviewed recent CBC lab results from 09/25/22.]  Doctor Visits Discussed/Reviewed --  [name] upcoming provider visits.  Advised to keep follow up provider appointments as recommended]  Exercise Interventions   Physical Activity Discussed/Reviewed Physical Activity Reviewed  [assessed patients current physical activity]  Education Interventions   Education Provided Provided Education  [Discussed signs/ symptoms of infection.  Advised to notify provider for infection symptoms. Reviewed heart failure symptoms.]   Nutrition Interventions   Nutrition Discussed/Reviewed Nutrition Reviewed, Increasing proteins, Adding fruits and vegetables              SDOH assessments and interventions completed:  No     Care Coordination Interventions:  Yes, provided   Follow up plan: Follow up call scheduled for 10/30/22    Encounter Outcome:  Pt. Visit Completed   [name] RN,BSN,CCM [facility] Care Coordination [phone] direct line

## 2022-09-27 NOTE — Patient Instructions (Signed)
Visit Information  Thank you for taking time to visit with me today. Please don't hesitate to contact me if I can be of assistance to you.   Following are the goals we discussed today:   Goals Addressed             This Visit's Progress    Continued improvement post hospitalization and managment       Interventions Today    Flowsheet Row Most Recent Value  Chronic Disease   Chronic disease during today's visit Congestive Heart Failure (CHF), Other  [Acute Myeloid Leukemia]  General Interventions   General Interventions Discussed/Reviewed General Interventions Reviewed, Doctor Visits, Labs  [evaluation of current treatment plan for HF, acute myeloid leukemia and patients adherence to plan as established by provider. Assessed for heart failure symptoms and any new / ongoing symptoms with AML.]  Labs --  [reviewed recent CBC lab results from 09/25/22.]  Doctor Visits Discussed/Reviewed --  [name] upcoming provider visits.  Advised to keep follow up provider appointments as recommended]  Exercise Interventions   Physical Activity Discussed/Reviewed Physical Activity Reviewed  [assessed patients current physical activity]  Education Interventions   Education Provided Provided Education  [Discussed signs/ symptoms of infection.  Advised to notify provider for infection symptoms. Reviewed heart failure symptoms.]  Nutrition Interventions   Nutrition Discussed/Reviewed Nutrition Reviewed, Increasing proteins, Adding fruits and vegetables              Our next appointment is by telephone on 10/30/22 at 11: 00 am  Please call the care guide team at [phone] if you need to cancel or reschedule your appointment.   If you are experiencing a Mental Health or Behavioral Health Crisis or need someone to talk to, please call the Suicide and Crisis Lifeline: 988 call 1-800-273-TALK (toll free, 24 hour hotline)  Patient verbalizes understanding of instructions and care plan provided  today and agrees to view in MyChart. Active MyChart status and patient understanding of how to access instructions and care plan via MyChart confirmed with patient.     [name] RN,BSN,CCM [facility] Care Coordination [phone] direct line

## 2022-09-28 ENCOUNTER — Inpatient Hospital Stay: Payer: Medicare Other

## 2022-09-28 DIAGNOSIS — Z79899 Other long term (current) drug therapy: Secondary | ICD-10-CM | POA: Diagnosis not present

## 2022-09-28 DIAGNOSIS — C9200 Acute myeloblastic leukemia, not having achieved remission: Secondary | ICD-10-CM | POA: Diagnosis not present

## 2022-10-02 ENCOUNTER — Inpatient Hospital Stay: Payer: Medicare Other

## 2022-10-05 ENCOUNTER — Inpatient Hospital Stay: Payer: Medicare Other

## 2022-10-05 DIAGNOSIS — C9200 Acute myeloblastic leukemia, not having achieved remission: Secondary | ICD-10-CM | POA: Diagnosis not present

## 2022-10-10 ENCOUNTER — Inpatient Hospital Stay: Payer: Medicare Other

## 2022-10-12 ENCOUNTER — Other Ambulatory Visit: Payer: Self-pay

## 2022-10-12 ENCOUNTER — Inpatient Hospital Stay: Payer: Medicare Other

## 2022-10-12 ENCOUNTER — Inpatient Hospital Stay: Payer: Medicare Other | Attending: Hematology and Oncology

## 2022-10-12 DIAGNOSIS — C9200 Acute myeloblastic leukemia, not having achieved remission: Secondary | ICD-10-CM | POA: Insufficient documentation

## 2022-10-12 DIAGNOSIS — D696 Thrombocytopenia, unspecified: Secondary | ICD-10-CM | POA: Insufficient documentation

## 2022-10-12 LAB — SAMPLE TO BLOOD BANK

## 2022-10-12 LAB — CBC WITH DIFFERENTIAL (CANCER CENTER ONLY)
Abs Immature Granulocytes: 0.01 10*3/uL (ref 0.00–0.07)
Basophils Absolute: 0 10*3/uL (ref 0.0–0.1)
Basophils Relative: 0 %
Eosinophils Absolute: 0.3 10*3/uL (ref 0.0–0.5)
Eosinophils Relative: 11 %
HCT: 30.5 % — ABNORMAL LOW (ref 36.0–46.0)
Hemoglobin: 10.1 g/dL — ABNORMAL LOW (ref 12.0–15.0)
Immature Granulocytes: 0 %
Lymphocytes Relative: 34 %
Lymphs Abs: 1.1 10*3/uL (ref 0.7–4.0)
MCH: 32.7 pg (ref 26.0–34.0)
MCHC: 33.1 g/dL (ref 30.0–36.0)
MCV: 98.7 fL (ref 80.0–100.0)
Monocytes Absolute: 0.1 10*3/uL (ref 0.1–1.0)
Monocytes Relative: 4 %
Neutro Abs: 1.6 10*3/uL — ABNORMAL LOW (ref 1.7–7.7)
Neutrophils Relative %: 51 %
Platelet Count: 49 10*3/uL — ABNORMAL LOW (ref 150–400)
RBC: 3.09 MIL/uL — ABNORMAL LOW (ref 3.87–5.11)
RDW: 18.7 % — ABNORMAL HIGH (ref 11.5–15.5)
Smear Review: NORMAL
WBC Count: 3.1 10*3/uL — ABNORMAL LOW (ref 4.0–10.5)
nRBC: 0 % (ref 0.0–0.2)

## 2022-10-12 LAB — CMP (CANCER CENTER ONLY)
ALT: 8 U/L (ref 0–44)
AST: 16 U/L (ref 15–41)
Albumin: 3.7 g/dL (ref 3.5–5.0)
Alkaline Phosphatase: 74 U/L (ref 38–126)
Anion gap: 5 (ref 5–15)
BUN: 21 mg/dL (ref 8–23)
CO2: 30 mmol/L (ref 22–32)
Calcium: 9.1 mg/dL (ref 8.9–10.3)
Chloride: 107 mmol/L (ref 98–111)
Creatinine: 0.74 mg/dL (ref 0.44–1.00)
GFR, Estimated: >60 mL/min (ref >60)
Glucose, Bld: 128 mg/dL — ABNORMAL HIGH (ref 70–99)
Potassium: 3.8 mmol/L (ref 3.5–5.1)
Sodium: 142 mmol/L (ref 135–145)
Total Bilirubin: 0.5 mg/dL (ref 0.3–1.2)
Total Protein: 6.2 g/dL — ABNORMAL LOW (ref 6.5–8.1)

## 2022-10-12 LAB — MAGNESIUM: Magnesium: 1.8 mg/dL (ref 1.7–2.4)

## 2022-10-12 MED ORDER — SODIUM CHLORIDE 0.9% FLUSH
10.0000 mL | Freq: Once | INTRAVENOUS | Status: AC
  Administered 2022-10-12: 10 mL

## 2022-10-12 NOTE — Progress Notes (Signed)
Per MD parameters, no blood or platelet transfusion needed at this time- Dr. Leonides Schanz made aware. EKG obtained and given to Dr. Leonides Schanz. Patient denies bruising, increased fatigue or bleeding at this time. Patient left [facility] in stable condition and with no complaints.

## 2022-10-13 ENCOUNTER — Inpatient Hospital Stay: Payer: Medicare Other | Admitting: Hematology and Oncology

## 2022-10-13 ENCOUNTER — Inpatient Hospital Stay: Payer: Medicare Other

## 2022-10-16 ENCOUNTER — Other Ambulatory Visit: Payer: Self-pay | Admitting: Family Medicine

## 2022-10-19 ENCOUNTER — Other Ambulatory Visit: Payer: Self-pay

## 2022-10-19 ENCOUNTER — Inpatient Hospital Stay: Payer: Medicare Other

## 2022-10-19 ENCOUNTER — Other Ambulatory Visit: Payer: Self-pay | Admitting: *Deleted

## 2022-10-19 DIAGNOSIS — C9200 Acute myeloblastic leukemia, not having achieved remission: Secondary | ICD-10-CM

## 2022-10-19 DIAGNOSIS — D696 Thrombocytopenia, unspecified: Secondary | ICD-10-CM | POA: Diagnosis not present

## 2022-10-19 LAB — CMP (CANCER CENTER ONLY)
ALT: 10 U/L (ref 0–44)
AST: 18 U/L (ref 15–41)
Albumin: 3.8 g/dL (ref 3.5–5.0)
Alkaline Phosphatase: 61 U/L (ref 38–126)
Anion gap: 5 (ref 5–15)
BUN: 32 mg/dL — ABNORMAL HIGH (ref 8–23)
CO2: 31 mmol/L (ref 22–32)
Calcium: 9.8 mg/dL (ref 8.9–10.3)
Chloride: 103 mmol/L (ref 98–111)
Creatinine: 0.75 mg/dL (ref 0.44–1.00)
GFR, Estimated: >60 mL/min (ref >60)
Glucose, Bld: 106 mg/dL — ABNORMAL HIGH (ref 70–99)
Potassium: 4.5 mmol/L (ref 3.5–5.1)
Sodium: 139 mmol/L (ref 135–145)
Total Bilirubin: 0.6 mg/dL (ref 0.3–1.2)
Total Protein: 6.6 g/dL (ref 6.5–8.1)

## 2022-10-19 LAB — CBC WITH DIFFERENTIAL (CANCER CENTER ONLY)
Abs Immature Granulocytes: 0 10*3/uL (ref 0.00–0.07)
Basophils Absolute: 0 10*3/uL (ref 0.0–0.1)
Basophils Relative: 1 %
Eosinophils Absolute: 0.2 10*3/uL (ref 0.0–0.5)
Eosinophils Relative: 8 %
HCT: 28.9 % — ABNORMAL LOW (ref 36.0–46.0)
Hemoglobin: 9.8 g/dL — ABNORMAL LOW (ref 12.0–15.0)
Immature Granulocytes: 0 %
Lymphocytes Relative: 56 %
Lymphs Abs: 1 10*3/uL (ref 0.7–4.0)
MCH: 33.6 pg (ref 26.0–34.0)
MCHC: 33.9 g/dL (ref 30.0–36.0)
MCV: 99 fL (ref 80.0–100.0)
Monocytes Absolute: 0.1 10*3/uL (ref 0.1–1.0)
Monocytes Relative: 5 %
Neutro Abs: 0.5 10*3/uL — ABNORMAL LOW (ref 1.7–7.7)
Neutrophils Relative %: 30 %
Platelet Count: 19 10*3/uL — ABNORMAL LOW (ref 150–400)
RBC: 2.92 MIL/uL — ABNORMAL LOW (ref 3.87–5.11)
RDW: 18.5 % — ABNORMAL HIGH (ref 11.5–15.5)
WBC Count: 1.8 10*3/uL — ABNORMAL LOW (ref 4.0–10.5)
nRBC: 0 % (ref 0.0–0.2)

## 2022-10-19 LAB — MAGNESIUM: Magnesium: 1.9 mg/dL (ref 1.7–2.4)

## 2022-10-19 LAB — SAMPLE TO BLOOD BANK

## 2022-10-19 LAB — ABO/RH: ABO/RH(D): A POS

## 2022-10-19 MED ORDER — ACETAMINOPHEN 325 MG PO TABS
650.0000 mg | ORAL_TABLET | Freq: Once | ORAL | Status: AC
  Administered 2022-10-19: 650 mg via ORAL
  Filled 2022-10-19: qty 2

## 2022-10-19 MED ORDER — HEPARIN SOD (PORK) LOCK FLUSH 100 UNIT/ML IV SOLN
500.0000 [IU] | Freq: Every day | INTRAVENOUS | Status: AC | PRN
  Administered 2022-10-19: 500 [IU]

## 2022-10-19 MED ORDER — SODIUM CHLORIDE 0.9% FLUSH
10.0000 mL | INTRAVENOUS | Status: AC | PRN
  Administered 2022-10-19: 10 mL

## 2022-10-19 MED ORDER — SODIUM CHLORIDE 0.9% IV SOLUTION
250.0000 mL | Freq: Once | INTRAVENOUS | Status: AC
  Administered 2022-10-19: 250 mL via INTRAVENOUS

## 2022-10-19 MED ORDER — SODIUM CHLORIDE 0.9% FLUSH
10.0000 mL | Freq: Once | INTRAVENOUS | Status: AC
  Administered 2022-10-19: 10 mL

## 2022-10-19 NOTE — Patient Instructions (Signed)
Platelet Transfusion A platelet transfusion is a procedure in which a person receives donated platelets through an IV. Platelets are parts of blood that stick together and form a clot to help the body stop bleeding after an injury. If you have too few platelets, your blood may have trouble clotting. This may cause you to bleed and bruise very easily. You may need a platelet transfusion if you have a condition that causes a low number of platelets (thrombocytopenia). A platelet transfusion may be used to stop or prevent excessive bleeding. Tell a health care provider about: Any reactions you have had during previous transfusions. Any allergies you have. All medicines you are taking, including vitamins, herbs, eye drops, creams, and over-the-counter medicines. Any bleeding problems you have. Any surgeries you have had. Any medical conditions you have. Whether you are pregnant or may be pregnant. What are the risks? Generally, this is a safe procedure. However, problems may occur, including: Fever. Infection. Allergic reaction to the donated (donor) platelets. Your body's disease-fighting system (immune system) attacking the donor platelets (hemolytic reaction). This is rare. A rare reaction that causes lung damage (transfusion-related acute lung injury). What happens before the procedure? Medicines Ask your health care provider about: Changing or stopping your regular medicines. This is especially important if you are taking diabetes medicines or blood thinners. Taking medicines such as aspirin and ibuprofen. These medicines can thin your blood. Do not take these medicines unless your health care provider tells you to take them. Taking over-the-counter medicines, vitamins, herbs, and supplements. General instructions You will have a blood test to determine your blood type. Your blood type determines what kind of platelets you will be given. Follow instructions from your health care provider  about eating or drinking restrictions. If you have had an allergic reaction to a transfusion in the past, you may be given medicine to help prevent a reaction. Your temperature, blood pressure, pulse, and breathing will be monitored. What happens during the procedure?  An IV will be inserted into one of your veins. For your safety, two health care providers will verify your identity along with the donor platelets about to be infused. A bag of donor platelets will be connected to your IV. The platelets will flow into your bloodstream. This usually takes 30-60 minutes. Your temperature, blood pressure, pulse, and breathing will be monitored during the transfusion. This helps detect early signs of any reaction. You will also be monitored for other symptoms that may indicate a reaction, including chills, hives, or itching. If you have signs of a reaction at any time, your transfusion will be stopped, and you may be given medicine to help manage the reaction. When your transfusion is complete, your IV will be removed. Pressure may be applied to the IV site for a few minutes to stop any bleeding. The IV site will be covered with a bandage (dressing). The procedure may vary among health care providers and hospitals. What can I expect after the procedure? Your blood pressure, temperature, pulse, and breathing will be monitored until you leave the hospital or clinic. You may have some bruising and soreness at your IV site. Follow these instructions at home: Medicines Take over-the-counter and prescription medicines only as told by your health care provider. Talk with your health care provider before you take any medicines that contain aspirin or NSAIDs, such as ibuprofen. These medicines increase your risk for dangerous bleeding. IV site care Check your IV site every day for signs of infection. Check for:   Redness, swelling, or pain. Fluid or blood. If fluid or blood drains from your IV site, use your  hands to press down firmly on a bandage covering the area for a minute or two. Doing this should stop the bleeding. Warmth. Pus or a bad smell. General instructions Change or remove your dressing as told by your health care provider. Return to your normal activities as told by your health care provider. Ask your health care provider what activities are safe for you. Do not take baths, swim, or use a hot tub until your health care provider approves. Ask your health care provider if you may take showers. Keep all follow-up visits. This is important. Contact a health care provider if: You have a headache that does not go away with medicine. You have hives, rash, or itchy skin. You have nausea or vomiting. You feel unusually tired or weak. You have signs of infection at your IV site. Get help right away if: You have a fever or chills. You urinate less often than usual. Your urine is darker colored than normal. You have any of the following: Trouble breathing. Pain in your back, abdomen, or chest. Cool, clammy skin. A fast heartbeat. Summary Platelets are tiny pieces of blood cells that clump together to form a blood clot when you have an injury. If you have too few platelets, your blood may have trouble clotting. A platelet transfusion is a procedure in which you receive donated platelets through an IV. A platelet transfusion may be used to stop or prevent excessive bleeding. After the procedure, check your IV site every day for signs of infection. This information is not intended to replace advice given to you by your health care provider. Make sure you discuss any questions you have with your health care provider. Document Revised: 07/29/2020 Document Reviewed: 07/29/2020 Elsevier Patient Education  2024 [employer]  

## 2022-10-20 ENCOUNTER — Other Ambulatory Visit: Payer: Self-pay | Admitting: Family Medicine

## 2022-10-20 LAB — BPAM PLATELET PHERESIS
Blood Product Expiration Date: 202409132359
ISSUE DATE / TIME: 202409121353
Unit Type and Rh: 5100

## 2022-10-20 LAB — PREPARE PLATELET PHERESIS: Unit division: 0

## 2022-10-20 NOTE — Telephone Encounter (Signed)
LAST APPOINTMENT DATE:08/08/22 wt loss last anxiety 05/17/22   NEXT APPOINTMENT DATE: Visit date not found    LAST REFILL: 05/12/22  QTY: #30 3rf

## 2022-10-23 DIAGNOSIS — C9200 Acute myeloblastic leukemia, not having achieved remission: Secondary | ICD-10-CM | POA: Diagnosis not present

## 2022-10-23 DIAGNOSIS — I10 Essential (primary) hypertension: Secondary | ICD-10-CM | POA: Diagnosis not present

## 2022-10-26 DIAGNOSIS — I10 Essential (primary) hypertension: Secondary | ICD-10-CM | POA: Diagnosis not present

## 2022-10-26 DIAGNOSIS — C9200 Acute myeloblastic leukemia, not having achieved remission: Secondary | ICD-10-CM | POA: Diagnosis not present

## 2022-10-27 ENCOUNTER — Telehealth: Payer: Self-pay | Admitting: Hematology and Oncology

## 2022-10-30 ENCOUNTER — Inpatient Hospital Stay: Payer: Medicare Other

## 2022-10-30 ENCOUNTER — Ambulatory Visit: Payer: Self-pay

## 2022-10-30 DIAGNOSIS — C9200 Acute myeloblastic leukemia, not having achieved remission: Secondary | ICD-10-CM

## 2022-10-30 DIAGNOSIS — D696 Thrombocytopenia, unspecified: Secondary | ICD-10-CM | POA: Diagnosis not present

## 2022-10-30 LAB — SAMPLE TO BLOOD BANK

## 2022-10-30 LAB — CBC WITH DIFFERENTIAL (CANCER CENTER ONLY)
Abs Immature Granulocytes: 0 10*3/uL (ref 0.00–0.07)
Basophils Absolute: 0 10*3/uL (ref 0.0–0.1)
Basophils Relative: 0 %
Eosinophils Absolute: 0 10*3/uL (ref 0.0–0.5)
Eosinophils Relative: 3 %
HCT: 24.8 % — ABNORMAL LOW (ref 36.0–46.0)
Hemoglobin: 8.7 g/dL — ABNORMAL LOW (ref 12.0–15.0)
Immature Granulocytes: 0 %
Lymphocytes Relative: 81 %
Lymphs Abs: 1.3 10*3/uL (ref 0.7–4.0)
MCH: 34 pg (ref 26.0–34.0)
MCHC: 35.1 g/dL (ref 30.0–36.0)
MCV: 96.9 fL (ref 80.0–100.0)
Monocytes Absolute: 0.1 10*3/uL (ref 0.1–1.0)
Monocytes Relative: 4 %
Neutro Abs: 0.2 10*3/uL — CL (ref 1.7–7.7)
Neutrophils Relative %: 12 %
Platelet Count: 20 10*3/uL — ABNORMAL LOW (ref 150–400)
RBC: 2.56 MIL/uL — ABNORMAL LOW (ref 3.87–5.11)
RDW: 17.8 % — ABNORMAL HIGH (ref 11.5–15.5)
Smear Review: DECREASED
WBC Count: 1.6 10*3/uL — ABNORMAL LOW (ref 4.0–10.5)
nRBC: 0 % (ref 0.0–0.2)

## 2022-10-30 LAB — CMP (CANCER CENTER ONLY)
ALT: 14 U/L (ref 0–44)
AST: 21 U/L (ref 15–41)
Albumin: 4.1 g/dL (ref 3.5–5.0)
Alkaline Phosphatase: 63 U/L (ref 38–126)
Anion gap: 7 (ref 5–15)
BUN: 35 mg/dL — ABNORMAL HIGH (ref 8–23)
CO2: 27 mmol/L (ref 22–32)
Calcium: 10.1 mg/dL (ref 8.9–10.3)
Chloride: 104 mmol/L (ref 98–111)
Creatinine: 1.16 mg/dL — ABNORMAL HIGH (ref 0.44–1.00)
GFR, Estimated: 48 mL/min — ABNORMAL LOW (ref >60)
Glucose, Bld: 101 mg/dL — ABNORMAL HIGH (ref 70–99)
Potassium: 4.5 mmol/L (ref 3.5–5.1)
Sodium: 138 mmol/L (ref 135–145)
Total Bilirubin: 0.6 mg/dL (ref 0.3–1.2)
Total Protein: 6.9 g/dL (ref 6.5–8.1)

## 2022-10-30 LAB — MAGNESIUM: Magnesium: 2 mg/dL (ref 1.7–2.4)

## 2022-10-30 MED ORDER — SODIUM CHLORIDE 0.9% FLUSH
10.0000 mL | Freq: Once | INTRAVENOUS | Status: AC
  Administered 2022-10-30: 10 mL

## 2022-10-30 MED ORDER — HEPARIN SOD (PORK) LOCK FLUSH 100 UNIT/ML IV SOLN
500.0000 [IU] | Freq: Once | INTRAVENOUS | Status: AC
  Administered 2022-10-30: 500 [IU] via INTRAVENOUS

## 2022-10-30 NOTE — Patient Instructions (Addendum)
Visit Information  Thank you for taking time to visit with me today. Please don't hesitate to contact me if I can be of assistance to you.   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 Assessed for heart failure symptoms and any new / ongoing symptoms with AML.]  Labs --  [reviewed recent CBC lab results from 09/25/22.]  Doctor Visits Discussed/Reviewed --  [name] upcoming provider visits.  Advised to keep follow up provider appointments as recommended]  Exercise Interventions   Physical Activity Discussed/Reviewed Physical Activity Reviewed  [assessed patients current physical activity]  Education Interventions   Education Provided Provided Education  [Discussed signs/ symptoms of infection.  Advised to notify provider for infection symptoms. Reviewed heart failure symptoms.]  Nutrition Interventions   Nutrition Discussed/Reviewed Nutrition Reviewed, Increasing proteins, Adding fruits and vegetables           Management of chronic health conditions.       Interventions Today    Flowsheet Row Most Recent Value  Chronic Disease   Chronic disease during today's visit Congestive Heart Failure (CHF), Other  [leukemia]  General Interventions   General Interventions Discussed/Reviewed General Interventions Reviewed, Doctor Visits, Labs  [evaluation of current treatment plan for heart failure/ Leukemia and patients adherence to plan as  established by provider.  Assessed for heart failure / Leukemia symptoms.]  Labs --  [Confirmed patient to have lab with oncology office 2 x per week.]  Doctor Visits Discussed/Reviewed Doctor Visits Reviewed  Algis Downs to keep follow up visit wiith provider.  Advised to nofify provider for ongoing or new symptom unresolved by current treatment plan.]  Exercise Interventions   Exercise Discussed/Reviewed Physical Activity  [Assessed patients activity level.]  Education Interventions   Education Provided Provided Education  [reviewed heart failure symptoms. Advised to notify provider for mild/ moderate symptoms.  call 911 for severe symptoms.  Fall precaution article sent to patient in Mychart]  Nutrition Interventions   Nutrition Discussed/Reviewed Nutrition Reviewed, Increasing proteins, Adding fruits and vegetables  Pharmacy Interventions   Pharmacy Dicussed/Reviewed Pharmacy Topics Reviewed  [medications reviewed and discussed importance of compliance.]  Safety Interventions   Safety Discussed/Reviewed Fall Risk  [Assessed for falls. Advised to use walker for safety/ balance as recommended by provider.  Reviewed fall precautions and discussed adherence. Discussed with patient importance of notifying provider of falls.]              Our next appointment is by telephone on 12/05/22 at 11 am  Please call the care guide team at [phone] if you need to cancel or reschedule your appointment.   If you are experiencing a Mental Health or Behavioral Health Crisis or need someone to talk to, please call the Suicide and Crisis Lifeline: 988 call 1-800-273-TALK (toll free, 24 hour hotline)  Patient verbalizes understanding of instructions and care plan provided today and agrees to view in MyChart. Active MyChart status and patient understanding of how to  access instructions and care plan via MyChart confirmed with patient.     [name] RN,BSN,CCM [facility] Care Coordination [phone]  direct line  Fall Prevention in the Home, Adult Falls can cause injuries and can happen to people of all ages. There are many things you can do to make your home safer and to help prevent falls. What actions can I take to prevent falls? General information Use good lighting in all rooms. Make sure to: Replace any light bulbs that burn out. Turn on the lights in dark areas and use night-lights. Keep items that you use often in easy-to-reach places. Lower the shelves around your home if needed. Move furniture so that there are clear paths around it. Do not use throw rugs or other things on the floor that can make you trip. If any of your floors are uneven, fix them. Add color or contrast paint or tape to clearly mark and help you see: Grab bars or handrails. First and last steps of staircases. Where the edge of each step is. If you use a ladder or stepladder: Make sure that it is fully opened. Do not climb a closed ladder. Make sure the sides of the ladder are locked in place. Have someone hold the ladder while you use it. Know where your pets are as you move through your home. What can I do in the bathroom?     Keep the floor dry. Clean up any water on the floor right away. Remove soap buildup in the bathtub or shower. Buildup makes bathtubs and showers slippery. Use non-skid mats or decals on the floor of the bathtub or shower. Attach bath mats securely with double-sided, non-slip rug tape. If you need to sit down in the shower, use a non-slip stool. Install grab bars by the toilet and in the bathtub and shower. Do not use towel bars as grab bars. What can I do in the bedroom? Make sure that you have a light by your bed that is easy to reach. Do not use any sheets or blankets on your bed that hang to the floor. Have a firm chair or bench with side arms that you can use for support when you get dressed. What can I do in the kitchen? Clean up any spills right away. If you need to  reach something above you, use a step stool with a grab bar. Keep electrical cords out of the way. Do not use floor polish or wax that makes floors slippery. What can I do with my stairs? Do not leave anything on the stairs. Make sure that you have a light switch at the top and the bottom of the stairs. Make sure that there are handrails on both sides of the stairs. Fix handrails that are broken or loose. Install non-slip stair treads on all your stairs if they do not have carpet. Avoid having throw rugs at the top or bottom of the stairs. Choose a carpet that does not hide the edge of the steps on the stairs. Make sure that the carpet is firmly attached to the stairs. Fix carpet that is loose or worn. What can I do on the outside of my home? Use bright outdoor lighting. Fix the edges of walkways and driveways and fix any cracks. Clear paths of anything that can make you trip, such as tools or rocks. Add color or contrast paint or tape to clearly mark and help you see anything that might make you trip as you walk through a  door, such as a raised step or threshold. Trim any bushes or trees on paths to your home. Check to see if handrails are loose or broken and that both sides of all steps have handrails. Install guardrails along the edges of any raised decks and porches. Have leaves, snow, or ice cleared regularly. Use sand, salt, or ice melter on paths if you live where there is ice and snow during the winter. Clean up any spills in your garage right away. This includes grease or oil spills. What other actions can I take? Review your medicines with your doctor. Some medicines can cause dizziness or changes in blood pressure, which increase your risk of falling. Wear shoes that: Have a low heel. Do not wear high heels. Have rubber bottoms and are closed at the toe. Feel good on your feet and fit well. Use tools that help you move around if needed. These  include: Canes. Walkers. Scooters. Crutches. Ask your doctor what else you can do to help prevent falls. This may include seeing a physical therapist to learn to do exercises to move better and get stronger. Where to find more information Centers for Disease Control and Prevention, [employer]AD[employer]: TonerPromos.no [employer] on Aging: BaseRingTones.pl National Institute on Aging: BaseRingTones.pl Contact a doctor if: You are afraid of falling at home. You feel weak, drowsy, or dizzy at home. You fall at home. Get help right away if you: Lose consciousness or have trouble moving after a fall. Have a fall that causes a head injury. These symptoms may be an emergency. Get help right away. Call 911. Do not wait to see if the symptoms will go away. Do not drive yourself to the hospital. This information is not intended to replace advice given to you by your health care provider. Make sure you discuss any questions you have with your health care provider. Document Revised: 09/26/2021 Document Reviewed: 09/26/2021 Elsevier Patient Education  2024 [employer].

## 2022-10-30 NOTE — Progress Notes (Signed)
Per Dr. Leonides Schanz, no need for blood product transfusion with Hgb 8.7, Plt 20.  VSS at discharge, patient ambulated to lobby.

## 2022-10-30 NOTE — Patient Outreach (Signed)
Care Coordination   Follow Up Visit Note   10/30/2022 Name: [name] MRN: [id number] DOB: 08/07/1940  [name] is a 82 y.o. year old female who sees Nancy Nam, MD for primary care. I spoke with  [name] by phone today.  What matters to the patients health and wellness today?  Patient states she had a 2nd biopsy last week to determine if cancer is back and in her bones. States she continues to have ongoing fatigue and received an infusion last week due to low platelet count.  Patient states she is waiting to receive biopsy results this week to determine ongoing treatment plan.  Patient reports having a fall in her home on yesterday.  She reports tripping over boxes she had in her hallway.  She states she was not using her walker.  Patient denies major injury however reports sustaining a bruise to her left hip.  She reports applying ice on yesterday which helped with pain/ swelling.  She states she is doing fine today and is heading out to have her lab done at the oncology office.  Patient reports her appetite is good.  Patient states the shortness of breath she had has improved.  Reports having follow up visit with cardiologist last month.    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 Assessed for heart failure symptoms and any new / ongoing symptoms with AML.]  Labs --  [reviewed recent CBC lab results from 09/25/22.]  Doctor Visits Discussed/Reviewed --   [name] upcoming provider visits.  Advised to keep follow up provider appointments as recommended]  Exercise Interventions   Physical Activity Discussed/Reviewed Physical Activity Reviewed  [assessed patients current physical activity]  Education Interventions   Education Provided Provided Education  [Discussed signs/ symptoms of infection.  Advised to notify provider for infection symptoms. Reviewed heart failure symptoms.]  Nutrition Interventions   Nutrition Discussed/Reviewed Nutrition Reviewed, Increasing proteins, Adding fruits and vegetables           Management of chronic health conditions.       Interventions Today    Flowsheet Row Most Recent Value  Chronic Disease   Chronic disease during today's visit Congestive Heart Failure (CHF), Other  [leukemia]  General Interventions   General Interventions Discussed/Reviewed General Interventions Reviewed, Doctor Visits, Labs  [evaluation of current treatment plan for heart failure/ Leukemia and patients adherence to plan as established by provider.  Assessed for heart failure / Leukemia symptoms.]  Labs --  [Confirmed patient to have lab with oncology office 2 x per week.]  Doctor Visits Discussed/Reviewed Doctor Visits Reviewed  [name] to keep follow up visit wiith provider.  Advised to nofify provider for ongoing or new symptom unresolved by current treatment plan.]  Exercise Interventions   Exercise Discussed/Reviewed Physical Activity  [Assessed patients activity level.]  Education Interventions   Education Provided Provided Education  [reviewed heart failure symptoms. Advised to notify provider for mild/ moderate symptoms.  call 911 for severe  symptoms.  Fall precaution article sent to patient in Mychart]  Nutrition Interventions   Nutrition Discussed/Reviewed Nutrition Reviewed, Increasing proteins, Adding fruits and vegetables  Pharmacy Interventions   Pharmacy Dicussed/Reviewed Pharmacy Topics Reviewed  [medications  reviewed and discussed importance of compliance.]  Safety Interventions   Safety Discussed/Reviewed Fall Risk  [Assessed for falls. Advised to use walker for safety/ balance as recommended by provider.  Reviewed fall precautions and discussed adherence. Discussed with patient importance of notifying provider of falls.]              SDOH assessments and interventions completed:  No     Care Coordination Interventions:  Yes, provided   Follow up plan: Follow up call scheduled for 12/05/22    Encounter Outcome:  Patient Visit Completed   Nancy Ina RN,BSN,CCM [facility] Care Coordination [phone] direct line

## 2022-11-01 ENCOUNTER — Other Ambulatory Visit: Payer: Self-pay

## 2022-11-01 ENCOUNTER — Telehealth: Payer: Self-pay | Admitting: Hematology and Oncology

## 2022-11-01 DIAGNOSIS — C9200 Acute myeloblastic leukemia, not having achieved remission: Secondary | ICD-10-CM

## 2022-11-02 ENCOUNTER — Inpatient Hospital Stay: Payer: Medicare Other

## 2022-11-02 DIAGNOSIS — I10 Essential (primary) hypertension: Secondary | ICD-10-CM | POA: Diagnosis not present

## 2022-11-02 DIAGNOSIS — C9200 Acute myeloblastic leukemia, not having achieved remission: Secondary | ICD-10-CM | POA: Diagnosis not present

## 2022-11-06 ENCOUNTER — Other Ambulatory Visit: Payer: Self-pay | Admitting: *Deleted

## 2022-11-06 ENCOUNTER — Inpatient Hospital Stay: Payer: Medicare Other

## 2022-11-06 ENCOUNTER — Other Ambulatory Visit: Payer: Self-pay | Admitting: Hematology and Oncology

## 2022-11-06 ENCOUNTER — Other Ambulatory Visit: Payer: Self-pay

## 2022-11-06 DIAGNOSIS — D696 Thrombocytopenia, unspecified: Secondary | ICD-10-CM | POA: Diagnosis not present

## 2022-11-06 DIAGNOSIS — C9200 Acute myeloblastic leukemia, not having achieved remission: Secondary | ICD-10-CM

## 2022-11-06 LAB — CBC WITH DIFFERENTIAL (CANCER CENTER ONLY)
Abs Immature Granulocytes: 0.01 10*3/uL (ref 0.00–0.07)
Basophils Absolute: 0 10*3/uL (ref 0.0–0.1)
Basophils Relative: 0 %
Eosinophils Absolute: 0 10*3/uL (ref 0.0–0.5)
Eosinophils Relative: 1 %
HCT: 23.8 % — ABNORMAL LOW (ref 36.0–46.0)
Hemoglobin: 8.3 g/dL — ABNORMAL LOW (ref 12.0–15.0)
Immature Granulocytes: 1 %
Lymphocytes Relative: 80 %
Lymphs Abs: 1.2 10*3/uL (ref 0.7–4.0)
MCH: 34.7 pg — ABNORMAL HIGH (ref 26.0–34.0)
MCHC: 34.9 g/dL (ref 30.0–36.0)
MCV: 99.6 fL (ref 80.0–100.0)
Monocytes Absolute: 0.1 10*3/uL (ref 0.1–1.0)
Monocytes Relative: 6 %
Neutro Abs: 0.2 10*3/uL — CL (ref 1.7–7.7)
Neutrophils Relative %: 12 %
Platelet Count: 9 10*3/uL — CL (ref 150–400)
RBC: 2.39 MIL/uL — ABNORMAL LOW (ref 3.87–5.11)
RDW: 17.2 % — ABNORMAL HIGH (ref 11.5–15.5)
Smear Review: DECREASED
WBC Count: 1.4 10*3/uL — ABNORMAL LOW (ref 4.0–10.5)
nRBC: 0 % (ref 0.0–0.2)

## 2022-11-06 LAB — CMP (CANCER CENTER ONLY)
ALT: 12 U/L (ref 0–44)
AST: 18 U/L (ref 15–41)
Albumin: 4.1 g/dL (ref 3.5–5.0)
Alkaline Phosphatase: 62 U/L (ref 38–126)
Anion gap: 7 (ref 5–15)
BUN: 56 mg/dL — ABNORMAL HIGH (ref 8–23)
CO2: 27 mmol/L (ref 22–32)
Calcium: 10.1 mg/dL (ref 8.9–10.3)
Chloride: 103 mmol/L (ref 98–111)
Creatinine: 1.17 mg/dL — ABNORMAL HIGH (ref 0.44–1.00)
GFR, Estimated: 47 mL/min — ABNORMAL LOW (ref >60)
Glucose, Bld: 120 mg/dL — ABNORMAL HIGH (ref 70–99)
Potassium: 5.1 mmol/L (ref 3.5–5.1)
Sodium: 137 mmol/L (ref 135–145)
Total Bilirubin: 0.6 mg/dL (ref 0.3–1.2)
Total Protein: 6.8 g/dL (ref 6.5–8.1)

## 2022-11-06 LAB — SAMPLE TO BLOOD BANK

## 2022-11-06 LAB — MAGNESIUM: Magnesium: 2.1 mg/dL (ref 1.7–2.4)

## 2022-11-06 LAB — PREPARE RBC (CROSSMATCH)

## 2022-11-06 MED ORDER — HEPARIN SOD (PORK) LOCK FLUSH 100 UNIT/ML IV SOLN
500.0000 [IU] | Freq: Every day | INTRAVENOUS | Status: AC | PRN
  Administered 2022-11-06: 500 [IU]

## 2022-11-06 MED ORDER — SODIUM CHLORIDE 0.9% IV SOLUTION
250.0000 mL | Freq: Once | INTRAVENOUS | Status: AC
  Administered 2022-11-06: 250 mL via INTRAVENOUS

## 2022-11-06 MED ORDER — SODIUM CHLORIDE 0.9% FLUSH
10.0000 mL | INTRAVENOUS | Status: AC | PRN
  Administered 2022-11-06: 10 mL

## 2022-11-06 MED ORDER — SODIUM CHLORIDE 0.9% FLUSH
10.0000 mL | Freq: Once | INTRAVENOUS | Status: AC
  Administered 2022-11-06: 10 mL

## 2022-11-06 MED ORDER — ACETAMINOPHEN 325 MG PO TABS
650.0000 mg | ORAL_TABLET | Freq: Once | ORAL | Status: AC
  Administered 2022-11-06: 650 mg via ORAL
  Filled 2022-11-06: qty 2

## 2022-11-06 NOTE — Progress Notes (Signed)
Pt may take po Tylenol prior to platelets and not wait the 30 minutes to be transfused. She will get 1 unit of PRBCs after the platelets.  CRITICAL VALUE STICKER  CRITICAL VALUE: Platelets 9K  RECEIVER (on-site recipient of call):Beth Lantz Hermann, RN  DATE & TIME NOTIFIED: 11/06/22,   MESSENGER (representative from lab): Melissa  MD NOTIFIED: Dr. Leonides Schanz  TIME OF NOTIFICATION: 1430  RESPONSE:  pt to receive I unit of platelets and 1 unit of blood  Infuse platelets @ 336ml/hour

## 2022-11-06 NOTE — Patient Instructions (Signed)
Platelet Transfusion A platelet transfusion is a procedure in which a person receives donated platelets through an IV. Platelets are parts of blood that stick together and form a clot to help the body stop bleeding after an injury. If you have too few platelets, your blood may have trouble clotting. This may cause you to bleed and bruise very easily. You may need a platelet transfusion if you have a condition that causes a low number of platelets (thrombocytopenia). A platelet transfusion may be used to stop or prevent excessive bleeding. Tell a health care provider about: Any reactions you have had during previous transfusions. Any allergies you have. All medicines you are taking, including vitamins, herbs, eye drops, creams, and over-the-counter medicines. Any bleeding problems you have. Any surgeries you have had. Any medical conditions you have. Whether you are pregnant or may be pregnant. What are the risks? Generally, this is a safe procedure. However, problems may occur, including: Fever. Infection. Allergic reaction to the donated (donor) platelets. Your body's disease-fighting system (immune system) attacking the donor platelets (hemolytic reaction). This is rare. A rare reaction that causes lung damage (transfusion-related acute lung injury). What happens before the procedure? Medicines Ask your health care provider about: Changing or stopping your regular medicines. This is especially important if you are taking diabetes medicines or blood thinners. Taking medicines such as aspirin and ibuprofen. These medicines can thin your blood. Do not take these medicines unless your health care provider tells you to take them. Taking over-the-counter medicines, vitamins, herbs, and supplements. General instructions You will have a blood test to determine your blood type. Your blood type determines what kind of platelets you will be given. Follow instructions from your health care provider  about eating or drinking restrictions. If you have had an allergic reaction to a transfusion in the past, you may be given medicine to help prevent a reaction. Your temperature, blood pressure, pulse, and breathing will be monitored. What happens during the procedure?  An IV will be inserted into one of your veins. For your safety, two health care providers will verify your identity along with the donor platelets about to be infused. A bag of donor platelets will be connected to your IV. The platelets will flow into your bloodstream. This usually takes 30-60 minutes. Your temperature, blood pressure, pulse, and breathing will be monitored during the transfusion. This helps detect early signs of any reaction. You will also be monitored for other symptoms that may indicate a reaction, including chills, hives, or itching. If you have signs of a reaction at any time, your transfusion will be stopped, and you may be given medicine to help manage the reaction. When your transfusion is complete, your IV will be removed. Pressure may be applied to the IV site for a few minutes to stop any bleeding. The IV site will be covered with a bandage (dressing). The procedure may vary among health care providers and hospitals. What can I expect after the procedure? Your blood pressure, temperature, pulse, and breathing will be monitored until you leave the hospital or clinic. You may have some bruising and soreness at your IV site. Follow these instructions at home: Medicines Take over-the-counter and prescription medicines only as told by your health care provider. Talk with your health care provider before you take any medicines that contain aspirin or NSAIDs, such as ibuprofen. These medicines increase your risk for dangerous bleeding. IV site care Check your IV site every day for signs of infection. Check for:   Redness, swelling, or pain. Fluid or blood. If fluid or blood drains from your IV site, use your  hands to press down firmly on a bandage covering the area for a minute or two. Doing this should stop the bleeding. Warmth. Pus or a bad smell. General instructions Change or remove your dressing as told by your health care provider. Return to your normal activities as told by your health care provider. Ask your health care provider what activities are safe for you. Do not take baths, swim, or use a hot tub until your health care provider approves. Ask your health care provider if you may take showers. Keep all follow-up visits. This is important. Contact a health care provider if: You have a headache that does not go away with medicine. You have hives, rash, or itchy skin. You have nausea or vomiting. You feel unusually tired or weak. You have signs of infection at your IV site. Get help right away if: You have a fever or chills. You urinate less often than usual. Your urine is darker colored than normal. You have any of the following: Trouble breathing. Pain in your back, abdomen, or chest. Cool, clammy skin. A fast heartbeat. Summary Platelets are tiny pieces of blood cells that clump together to form a blood clot when you have an injury. If you have too few platelets, your blood may have trouble clotting. A platelet transfusion is a procedure in which you receive donated platelets through an IV. A platelet transfusion may be used to stop or prevent excessive bleeding. After the procedure, check your IV site every day for signs of infection. This information is not intended to replace advice given to you by your health care provider. Make sure you discuss any questions you have with your health care provider. Document Revised: 07/29/2020 Document Reviewed: 07/29/2020 Elsevier Patient Education  2024 [employer]  

## 2022-11-07 ENCOUNTER — Inpatient Hospital Stay: Payer: Medicare Other | Attending: Hematology and Oncology

## 2022-11-07 DIAGNOSIS — C9200 Acute myeloblastic leukemia, not having achieved remission: Secondary | ICD-10-CM | POA: Diagnosis not present

## 2022-11-07 DIAGNOSIS — E079 Disorder of thyroid, unspecified: Secondary | ICD-10-CM | POA: Diagnosis not present

## 2022-11-07 LAB — PREPARE PLATELET PHERESIS: Unit division: 0

## 2022-11-07 LAB — BPAM PLATELET PHERESIS
Blood Product Expiration Date: 202410032359
ISSUE DATE / TIME: 202409301456
Unit Type and Rh: 7300

## 2022-11-07 LAB — PREPARE RBC (CROSSMATCH)

## 2022-11-07 MED ORDER — ACETAMINOPHEN 325 MG PO TABS
650.0000 mg | ORAL_TABLET | Freq: Once | ORAL | Status: AC
  Administered 2022-11-07: 650 mg via ORAL
  Filled 2022-11-07: qty 2

## 2022-11-07 MED ORDER — SODIUM CHLORIDE 0.9% IV SOLUTION
250.0000 mL | Freq: Once | INTRAVENOUS | Status: AC
  Administered 2022-11-07: 250 mL via INTRAVENOUS

## 2022-11-07 MED ORDER — HEPARIN SOD (PORK) LOCK FLUSH 100 UNIT/ML IV SOLN
500.0000 [IU] | Freq: Every day | INTRAVENOUS | Status: AC | PRN
  Administered 2022-11-07: 500 [IU]

## 2022-11-07 MED ORDER — SODIUM CHLORIDE 0.9% FLUSH
10.0000 mL | INTRAVENOUS | Status: AC | PRN
  Administered 2022-11-07: 10 mL

## 2022-11-07 NOTE — Patient Instructions (Signed)
Blood Transfusion, Adult A blood transfusion is a procedure in which you receive blood or a type of blood cell (blood component) through an IV. You may need a blood transfusion when you have a low blood count, which is a low number of any blood cell. This may result from a bleeding disorder, illness, injury, or surgery. The blood may come from a donor, or you may be able to have your own blood collected and stored (autologous blood donation) before a planned surgery. The blood given in a transfusion may be made up of different blood components. You may receive: Red blood cells. These carry oxygen to the cells in the body. Platelets. These help your blood to clot. Plasma. This is the liquid part of your blood. It carries proteins and other substances throughout the body. White blood cells. These help you fight infections. If you have hemophilia or another clotting disorder, you may also receive other types of blood products. Depending on the type of blood product, this procedure may take 1-4 hours to complete. Tell a health care provider about: Any bleeding problems you have. Any previous reactions you have had during a blood transfusion. Any allergies you have. All medicines you are taking, including vitamins, herbs, eye drops, creams, and over-the-counter medicines. Any surgeries you have had. Any medical conditions you have. Whether you are pregnant or may be pregnant. What are the risks? Talk with your health care provider about risks. The most common problems include: A mild allergic reaction, such as red, swollen areas of skin (hives) and itching. Fever or chills. This may be the body's response to new blood cells received. This may occur during or up to 4 hours after the transfusion. More serious problems may include: A serious allergic reaction that causes difficulty breathing or swelling around the face and lips. Transfusion-associated circulatory overload (TACO), or too much fluid in  the lungs. This may cause breathing problems. Transfusion-related acute lung injury (TRALI), which causes breathing difficulty and low oxygen in the blood. This can occur within hours of the transfusion or several days later. Iron overload. This can happen after receiving many blood transfusions over a period of time. Infection or virus being transmitted. This is rare because donated blood is carefully tested before it is given. Hemolytic transfusion reaction. This is rare. It happens when the body's defense system (immune system)tries to attack the new blood cells. Symptoms may include fever, chills, nausea, low blood pressure, and low back or chest pain. Transfusion-associated graft-versus-host disease (TAGVHD). This is rare. It happens when donated cells attack the body's healthy tissues. What happens before the procedure? You will have a blood test to check your blood type. This test is done to know what kind of blood your body will accept and to match it to the donor blood. If you are going to have a planned surgery, you may be able to do an autologous blood donation. This may be done in case you need to have a transfusion. You will have your temperature, blood pressure, and pulse checked before the transfusion. If you have had an allergic reaction to a transfusion in the past, you may be given medicine to help prevent a reaction. This medicine may be given to you by mouth (orally) or through an IV. What happens during the procedure?  An IV will be inserted into one of your veins. The bag of blood will be attached to your IV. The blood will then enter through your vein. Your temperature, blood pressure,   and pulse will be monitored during the transfusion. This monitoring is done to detect early signs of a transfusion reaction. Tell your nurse right away if you have any of these symptoms during the transfusion: Shortness of breath or trouble breathing. Chest or back pain. Fever or  chills. Itching or hives. If you have any signs or symptoms of a reaction, your transfusion will be stopped and you may be given medicine. When the transfusion is complete, your IV will be removed. Pressure may be applied to the IV site for a few minutes. A bandage (dressing)will be applied. The procedure may vary among health care providers and hospitals. What happens after the procedure? Your temperature, blood pressure, pulse, breathing rate, and blood oxygen level will be monitored until you leave the hospital or clinic. Your blood may be tested to see how you have responded to the transfusion. You may be warmed with fluids or blankets to maintain a normal body temperature. If you receive your blood transfusion in an outpatient setting, you will be told whom to contact to report any reactions. Where to find more information Visit the [employer]: redcross.org Summary A blood transfusion is a procedure in which you receive blood or a type of blood cell (blood component) through an IV. The blood given in a transfusion may be made up of different blood components. You may receive red blood cells, platelets, plasma, or white blood cells depending on the condition treated. Your temperature, blood pressure, and pulse will be monitored before, during, and after the transfusion. After the transfusion, your blood may be tested to see how your body has responded. This information is not intended to replace advice given to you by your health care provider. Make sure you discuss any questions you have with your health care provider. Document Revised: 04/22/2021 Document Reviewed: 04/22/2021 Elsevier Patient Education  2024 Elsevier Inc.  

## 2022-11-08 LAB — TYPE AND SCREEN
ABO/RH(D): A POS
Antibody Screen: NEGATIVE
Unit division: 0

## 2022-11-08 LAB — BPAM RBC
Blood Product Expiration Date: 202410222359
ISSUE DATE / TIME: 202410010846
Unit Type and Rh: 202410222359
Unit Type and Rh: 6200

## 2022-11-09 ENCOUNTER — Other Ambulatory Visit: Payer: Self-pay | Admitting: *Deleted

## 2022-11-09 ENCOUNTER — Inpatient Hospital Stay: Payer: Medicare Other

## 2022-11-09 ENCOUNTER — Telehealth: Payer: Self-pay

## 2022-11-09 ENCOUNTER — Inpatient Hospital Stay: Payer: Medicare Other | Admitting: Hematology and Oncology

## 2022-11-09 DIAGNOSIS — E079 Disorder of thyroid, unspecified: Secondary | ICD-10-CM | POA: Diagnosis not present

## 2022-11-09 DIAGNOSIS — C9200 Acute myeloblastic leukemia, not having achieved remission: Secondary | ICD-10-CM

## 2022-11-09 LAB — CBC WITH DIFFERENTIAL (CANCER CENTER ONLY)
Abs Immature Granulocytes: 0 10*3/uL (ref 0.00–0.07)
Basophils Absolute: 0 10*3/uL (ref 0.0–0.1)
Basophils Relative: 0 %
Eosinophils Absolute: 0 10*3/uL (ref 0.0–0.5)
Eosinophils Relative: 1 %
HCT: 26.8 % — ABNORMAL LOW (ref 36.0–46.0)
Hemoglobin: 9.7 g/dL — ABNORMAL LOW (ref 12.0–15.0)
Immature Granulocytes: 0 %
Lymphocytes Relative: 86 %
Lymphs Abs: 1.5 10*3/uL (ref 0.7–4.0)
MCH: 35 pg — ABNORMAL HIGH (ref 26.0–34.0)
MCHC: 36.2 g/dL — ABNORMAL HIGH (ref 30.0–36.0)
MCV: 96.8 fL (ref 80.0–100.0)
Monocytes Absolute: 0.1 10*3/uL (ref 0.1–1.0)
Monocytes Relative: 4 %
Neutro Abs: 0.2 10*3/uL — CL (ref 1.7–7.7)
Neutrophils Relative %: 9 %
Platelet Count: 14 10*3/uL — ABNORMAL LOW (ref 150–400)
RBC: 2.77 MIL/uL — ABNORMAL LOW (ref 3.87–5.11)
RDW: 16.5 % — ABNORMAL HIGH (ref 11.5–15.5)
Smear Review: NORMAL
WBC Count: 1.7 10*3/uL — ABNORMAL LOW (ref 4.0–10.5)
nRBC: 0 % (ref 0.0–0.2)

## 2022-11-09 LAB — CMP (CANCER CENTER ONLY)
ALT: 14 U/L (ref 0–44)
AST: 21 U/L (ref 15–41)
Albumin: 4.2 g/dL (ref 3.5–5.0)
Alkaline Phosphatase: 66 U/L (ref 38–126)
Anion gap: 7 (ref 5–15)
BUN: 35 mg/dL — ABNORMAL HIGH (ref 8–23)
CO2: 28 mmol/L (ref 22–32)
Calcium: 10.5 mg/dL — ABNORMAL HIGH (ref 8.9–10.3)
Chloride: 102 mmol/L (ref 98–111)
Creatinine: 1.05 mg/dL — ABNORMAL HIGH (ref 0.44–1.00)
GFR, Estimated: 54 mL/min — ABNORMAL LOW (ref >60)
Glucose, Bld: 104 mg/dL — ABNORMAL HIGH (ref 70–99)
Potassium: 4.5 mmol/L (ref 3.5–5.1)
Sodium: 137 mmol/L (ref 135–145)
Total Bilirubin: 0.9 mg/dL (ref 0.3–1.2)
Total Protein: 7.1 g/dL (ref 6.5–8.1)

## 2022-11-09 LAB — MAGNESIUM: Magnesium: 2.1 mg/dL (ref 1.7–2.4)

## 2022-11-09 LAB — SAMPLE TO BLOOD BANK

## 2022-11-09 MED ORDER — SODIUM CHLORIDE 0.9% IV SOLUTION
250.0000 mL | Freq: Once | INTRAVENOUS | Status: AC
  Administered 2022-11-09: 250 mL via INTRAVENOUS

## 2022-11-09 MED ORDER — SODIUM CHLORIDE 0.9% FLUSH
10.0000 mL | Freq: Once | INTRAVENOUS | Status: AC
  Administered 2022-11-09: 10 mL

## 2022-11-09 MED ORDER — ACETAMINOPHEN 325 MG PO TABS
650.0000 mg | ORAL_TABLET | Freq: Once | ORAL | Status: AC
  Administered 2022-11-09: 650 mg via ORAL
  Filled 2022-11-09: qty 2

## 2022-11-09 MED ORDER — SODIUM CHLORIDE 0.9% FLUSH
10.0000 mL | INTRAVENOUS | Status: AC | PRN
  Administered 2022-11-09: 10 mL

## 2022-11-09 MED ORDER — HEPARIN SOD (PORK) LOCK FLUSH 100 UNIT/ML IV SOLN
500.0000 [IU] | Freq: Every day | INTRAVENOUS | Status: AC | PRN
  Administered 2022-11-09: 500 [IU]

## 2022-11-09 NOTE — Progress Notes (Signed)
Platelet transfusion orders

## 2022-11-09 NOTE — Patient Instructions (Signed)
Platelet Transfusion A platelet transfusion is a procedure in which a person receives donated platelets through an IV. Platelets are parts of blood that stick together and form a clot to help the body stop bleeding after an injury. If you have too few platelets, your blood may have trouble clotting. This may cause you to bleed and bruise very easily. You may need a platelet transfusion if you have a condition that causes a low number of platelets (thrombocytopenia). A platelet transfusion may be used to stop or prevent excessive bleeding. Tell a health care provider about: Any reactions you have had during previous transfusions. Any allergies you have. All medicines you are taking, including vitamins, herbs, eye drops, creams, and over-the-counter medicines. Any bleeding problems you have. Any surgeries you have had. Any medical conditions you have. Whether you are pregnant or may be pregnant. What are the risks? Generally, this is a safe procedure. However, problems may occur, including: Fever. Infection. Allergic reaction to the donated (donor) platelets. Your body's disease-fighting system (immune system) attacking the donor platelets (hemolytic reaction). This is rare. A rare reaction that causes lung damage (transfusion-related acute lung injury). What happens before the procedure? Medicines Ask your health care provider about: Changing or stopping your regular medicines. This is especially important if you are taking diabetes medicines or blood thinners. Taking medicines such as aspirin and ibuprofen. These medicines can thin your blood. Do not take these medicines unless your health care provider tells you to take them. Taking over-the-counter medicines, vitamins, herbs, and supplements. General instructions You will have a blood test to determine your blood type. Your blood type determines what kind of platelets you will be given. Follow instructions from your health care provider  about eating or drinking restrictions. If you have had an allergic reaction to a transfusion in the past, you may be given medicine to help prevent a reaction. Your temperature, blood pressure, pulse, and breathing will be monitored. What happens during the procedure?  An IV will be inserted into one of your veins. For your safety, two health care providers will verify your identity along with the donor platelets about to be infused. A bag of donor platelets will be connected to your IV. The platelets will flow into your bloodstream. This usually takes 30-60 minutes. Your temperature, blood pressure, pulse, and breathing will be monitored during the transfusion. This helps detect early signs of any reaction. You will also be monitored for other symptoms that may indicate a reaction, including chills, hives, or itching. If you have signs of a reaction at any time, your transfusion will be stopped, and you may be given medicine to help manage the reaction. When your transfusion is complete, your IV will be removed. Pressure may be applied to the IV site for a few minutes to stop any bleeding. The IV site will be covered with a bandage (dressing). The procedure may vary among health care providers and hospitals. What can I expect after the procedure? Your blood pressure, temperature, pulse, and breathing will be monitored until you leave the hospital or clinic. You may have some bruising and soreness at your IV site. Follow these instructions at home: Medicines Take over-the-counter and prescription medicines only as told by your health care provider. Talk with your health care provider before you take any medicines that contain aspirin or NSAIDs, such as ibuprofen. These medicines increase your risk for dangerous bleeding. IV site care Check your IV site every day for signs of infection. Check for:   Redness, swelling, or pain. Fluid or blood. If fluid or blood drains from your IV site, use your  hands to press down firmly on a bandage covering the area for a minute or two. Doing this should stop the bleeding. Warmth. Pus or a bad smell. General instructions Change or remove your dressing as told by your health care provider. Return to your normal activities as told by your health care provider. Ask your health care provider what activities are safe for you. Do not take baths, swim, or use a hot tub until your health care provider approves. Ask your health care provider if you may take showers. Keep all follow-up visits. This is important. Contact a health care provider if: You have a headache that does not go away with medicine. You have hives, rash, or itchy skin. You have nausea or vomiting. You feel unusually tired or weak. You have signs of infection at your IV site. Get help right away if: You have a fever or chills. You urinate less often than usual. Your urine is darker colored than normal. You have any of the following: Trouble breathing. Pain in your back, abdomen, or chest. Cool, clammy skin. A fast heartbeat. Summary Platelets are tiny pieces of blood cells that clump together to form a blood clot when you have an injury. If you have too few platelets, your blood may have trouble clotting. A platelet transfusion is a procedure in which you receive donated platelets through an IV. A platelet transfusion may be used to stop or prevent excessive bleeding. After the procedure, check your IV site every day for signs of infection. This information is not intended to replace advice given to you by your health care provider. Make sure you discuss any questions you have with your health care provider. Document Revised: 07/29/2020 Document Reviewed: 07/29/2020 Elsevier Patient Education  2024 [employer]  

## 2022-11-09 NOTE — Telephone Encounter (Signed)
CRITICAL VALUE STICKER  CRITICAL VALUE: ANC 0.2  RECEIVER (on-site recipient of call): Sharlette Dense CMA  DATE & TIME NOTIFIED: 11/10/2022 1403  MESSENGER (representative from lab): Shanda Bumps in lab  MD NOTIFIED: Dr. Leonides Schanz  TIME OF NOTIFICATION: 1404  RESPONSE:  Made nurse and physician aware

## 2022-11-10 LAB — PREPARE PLATELET PHERESIS: Unit division: 0

## 2022-11-10 LAB — BPAM PLATELET PHERESIS
Blood Product Expiration Date: 202410052359
ISSUE DATE / TIME: 202410031420
Unit Type and Rh: 5100

## 2022-11-13 ENCOUNTER — Inpatient Hospital Stay: Payer: Medicare Other

## 2022-11-13 ENCOUNTER — Encounter: Payer: Self-pay | Admitting: Hematology and Oncology

## 2022-11-13 ENCOUNTER — Telehealth: Payer: Self-pay

## 2022-11-13 DIAGNOSIS — E079 Disorder of thyroid, unspecified: Secondary | ICD-10-CM | POA: Diagnosis not present

## 2022-11-13 DIAGNOSIS — C9200 Acute myeloblastic leukemia, not having achieved remission: Secondary | ICD-10-CM | POA: Diagnosis not present

## 2022-11-13 LAB — CBC WITH DIFFERENTIAL (CANCER CENTER ONLY)
Abs Immature Granulocytes: 0.01 10*3/uL (ref 0.00–0.07)
Basophils Absolute: 0 10*3/uL (ref 0.0–0.1)
Basophils Relative: 0 %
Eosinophils Absolute: 0 10*3/uL (ref 0.0–0.5)
Eosinophils Relative: 1 %
HCT: 28 % — ABNORMAL LOW (ref 36.0–46.0)
Hemoglobin: 9.6 g/dL — ABNORMAL LOW (ref 12.0–15.0)
Immature Granulocytes: 1 %
Lymphocytes Relative: 80 %
Lymphs Abs: 1.1 10*3/uL (ref 0.7–4.0)
MCH: 34.2 pg — ABNORMAL HIGH (ref 26.0–34.0)
MCHC: 34.3 g/dL (ref 30.0–36.0)
MCV: 99.6 fL (ref 80.0–100.0)
Monocytes Absolute: 0.1 10*3/uL (ref 0.1–1.0)
Monocytes Relative: 5 %
Neutro Abs: 0.2 10*3/uL — CL (ref 1.7–7.7)
Neutrophils Relative %: 13 %
Platelet Count: 24 10*3/uL — ABNORMAL LOW (ref 150–400)
RBC: 2.81 MIL/uL — ABNORMAL LOW (ref 3.87–5.11)
RDW: 15.5 % (ref 11.5–15.5)
Smear Review: NORMAL
WBC Count: 1.4 10*3/uL — ABNORMAL LOW (ref 4.0–10.5)
nRBC: 0 % (ref 0.0–0.2)

## 2022-11-13 LAB — CMP (CANCER CENTER ONLY)
ALT: 14 U/L (ref 0–44)
AST: 19 U/L (ref 15–41)
Albumin: 4.2 g/dL (ref 3.5–5.0)
Alkaline Phosphatase: 65 U/L (ref 38–126)
Anion gap: 8 (ref 5–15)
BUN: 32 mg/dL — ABNORMAL HIGH (ref 8–23)
CO2: 27 mmol/L (ref 22–32)
Calcium: 10.1 mg/dL (ref 8.9–10.3)
Chloride: 103 mmol/L (ref 98–111)
Creatinine: 1.15 mg/dL — ABNORMAL HIGH (ref 0.44–1.00)
GFR, Estimated: 48 mL/min — ABNORMAL LOW (ref >60)
Glucose, Bld: 102 mg/dL — ABNORMAL HIGH (ref 70–99)
Potassium: 4.7 mmol/L (ref 3.5–5.1)
Sodium: 138 mmol/L (ref 135–145)
Total Bilirubin: 0.5 mg/dL (ref 0.3–1.2)
Total Protein: 7 g/dL (ref 6.5–8.1)

## 2022-11-13 LAB — MAGNESIUM: Magnesium: 2.1 mg/dL (ref 1.7–2.4)

## 2022-11-13 LAB — SAMPLE TO BLOOD BANK

## 2022-11-13 NOTE — Telephone Encounter (Signed)
CRITICAL VALUE STICKER  CRITICAL VALUE: ANC 0.02  RECEIVER (on-site recipient of call): Kenzie RN   DATE & TIME NOTIFIED: 11/13/2022 0855  MESSENGER (representative from lab): Heather   MD NOTIFIED: Dr. Leonides Schanz  TIME OF NOTIFICATION: 11/13/2022 0910  RESPONSE: ; No response indicated

## 2022-11-13 NOTE — Progress Notes (Signed)
Per Dr. Leonides Schanz, no need for blood product transfusion with Hgb 9.6, Plt 24. Pt stable at discharge and ambulated to lobby.

## 2022-11-14 ENCOUNTER — Encounter: Payer: Self-pay | Admitting: Family Medicine

## 2022-11-14 ENCOUNTER — Ambulatory Visit (INDEPENDENT_AMBULATORY_CARE_PROVIDER_SITE_OTHER): Payer: Medicare Other | Admitting: Family Medicine

## 2022-11-14 VITALS — BP 130/62 | HR 78 | Temp 99.0°F | Ht 64.0 in | Wt 121.0 lb

## 2022-11-14 DIAGNOSIS — E039 Hypothyroidism, unspecified: Secondary | ICD-10-CM | POA: Diagnosis not present

## 2022-11-14 DIAGNOSIS — R351 Nocturia: Secondary | ICD-10-CM | POA: Diagnosis not present

## 2022-11-14 DIAGNOSIS — I509 Heart failure, unspecified: Secondary | ICD-10-CM

## 2022-11-14 DIAGNOSIS — C9200 Acute myeloblastic leukemia, not having achieved remission: Secondary | ICD-10-CM | POA: Diagnosis not present

## 2022-11-14 DIAGNOSIS — Z7189 Other specified counseling: Secondary | ICD-10-CM

## 2022-11-14 MED ORDER — SERTRALINE HCL 25 MG PO TABS: 25.0000 mg | ORAL_TABLET | Freq: Every day | ORAL | Status: DC

## 2022-11-14 NOTE — Progress Notes (Unsigned)
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  Dec LV function d/w pt.  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  Bone marrow biopsy 10/26/22.  Rare use of lasix.  Weight has been stable.  She started zoloft in the meantime, 25mg  a day.  She has R leg tremor.  Tremor started in the last few weeks.    She had cards f/u pending for 01/2023.  She has hematology f/u pending for 11/16/22.    No bleeding.  No pain.  No fevers.  Fever cautions d/w pt.  Fatigued.  She is still living alone.  Has family in the area.   Advance directive discussed with patient.  Sister [name] designated if patient were incapacitated.  She has nocturia. Taking 2-3 boost drinks per day. No burning with urination.    Meds, vitals, and allergies reviewed.   ROS: Per HPI unless specifically indicated in ROS section   Nad Ncat Neck supple, no LA Rrr Ctab Abd soft, not ttp R leg tremor Skin well perfused.   33 minutes were devoted to patient care in this encounter (this includes time spent reviewing the patient's file/history, interviewing and examining the patient, counseling/reviewing plan with patient).

## 2022-11-14 NOTE — Assessment & Plan Note (Addendum)
  Advance directive discussed with patient.  Sister [name] designated if patient were incapacitated.

## 2022-11-14 NOTE — Patient Instructions (Addendum)
Don't change your meds yet.  I'll check with hematology in the meantime.  Take care.  Glad to see you. Drink enough fluid to keep your urine clear or light colored but I would limit fluid right before bed.

## 2022-11-15 ENCOUNTER — Other Ambulatory Visit: Payer: Self-pay | Admitting: Hematology and Oncology

## 2022-11-15 DIAGNOSIS — C9200 Acute myeloblastic leukemia, not having achieved remission: Secondary | ICD-10-CM

## 2022-11-15 DIAGNOSIS — I509 Heart failure, unspecified: Secondary | ICD-10-CM | POA: Insufficient documentation

## 2022-11-15 DIAGNOSIS — R351 Nocturia: Secondary | ICD-10-CM | POA: Insufficient documentation

## 2022-11-15 DIAGNOSIS — I5020 Unspecified systolic (congestive) heart failure: Secondary | ICD-10-CM | POA: Insufficient documentation

## 2022-11-15 NOTE — Assessment & Plan Note (Signed)
No bleeding.  No pain.  No fevers.  Fever cautions d/w pt.  Fatigued.  She is still living alone.  Recent labs d/w pt.  She has hematology f/u pending and I will await that report.

## 2022-11-15 NOTE — Assessment & Plan Note (Signed)
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  Okay for outpatient f/u with cards.

## 2022-11-15 NOTE — Assessment & Plan Note (Signed)
With tremor noted. I will ask Dr. Leonides Schanz if he can check at follow up.  We agreed not to check labs today.

## 2022-11-15 NOTE — Assessment & Plan Note (Signed)
D/w pt about drinking adequate fluids in total but tapering some prior to bed.

## 2022-11-16 ENCOUNTER — Inpatient Hospital Stay: Payer: Medicare Other

## 2022-11-16 ENCOUNTER — Inpatient Hospital Stay: Payer: Medicare Other | Admitting: Hematology and Oncology

## 2022-11-16 ENCOUNTER — Other Ambulatory Visit: Payer: Self-pay

## 2022-11-16 VITALS — BP 127/57 | HR 59 | Temp 98.2°F | Resp 16 | Wt 120.8 lb

## 2022-11-16 DIAGNOSIS — E079 Disorder of thyroid, unspecified: Secondary | ICD-10-CM | POA: Diagnosis not present

## 2022-11-16 DIAGNOSIS — C9200 Acute myeloblastic leukemia, not having achieved remission: Secondary | ICD-10-CM

## 2022-11-16 LAB — CBC WITH DIFFERENTIAL (CANCER CENTER ONLY)
Abs Immature Granulocytes: 0 10*3/uL (ref 0.00–0.07)
Basophils Absolute: 0 10*3/uL (ref 0.0–0.1)
Basophils Relative: 0 %
Eosinophils Absolute: 0 10*3/uL (ref 0.0–0.5)
Eosinophils Relative: 1 %
HCT: 27.2 % — ABNORMAL LOW (ref 36.0–46.0)
Hemoglobin: 9.5 g/dL — ABNORMAL LOW (ref 12.0–15.0)
Immature Granulocytes: 0 %
Lymphocytes Relative: 81 %
Lymphs Abs: 1.2 10*3/uL (ref 0.7–4.0)
MCH: 34.3 pg — ABNORMAL HIGH (ref 26.0–34.0)
MCHC: 34.9 g/dL (ref 30.0–36.0)
MCV: 98.2 fL (ref 80.0–100.0)
Monocytes Absolute: 0.1 10*3/uL (ref 0.1–1.0)
Monocytes Relative: 7 %
Neutro Abs: 0.2 10*3/uL — CL (ref 1.7–7.7)
Neutrophils Relative %: 11 %
Platelet Count: 9 10*3/uL — CL (ref 150–400)
RBC: 2.77 MIL/uL — ABNORMAL LOW (ref 3.87–5.11)
RDW: 15.5 % (ref 11.5–15.5)
Smear Review: DECREASED
WBC Count: 1.5 10*3/uL — ABNORMAL LOW (ref 4.0–10.5)
nRBC: 0 % (ref 0.0–0.2)

## 2022-11-16 LAB — SAMPLE TO BLOOD BANK

## 2022-11-16 LAB — CMP (CANCER CENTER ONLY)
ALT: 17 U/L (ref 0–44)
AST: 21 U/L (ref 15–41)
Albumin: 4.1 g/dL (ref 3.5–5.0)
Alkaline Phosphatase: 59 U/L (ref 38–126)
Anion gap: 7 (ref 5–15)
BUN: 34 mg/dL — ABNORMAL HIGH (ref 8–23)
CO2: 27 mmol/L (ref 22–32)
Calcium: 10.3 mg/dL (ref 8.9–10.3)
Chloride: 104 mmol/L (ref 98–111)
Creatinine: 1.06 mg/dL — ABNORMAL HIGH (ref 0.44–1.00)
GFR, Estimated: 53 mL/min — ABNORMAL LOW (ref >60)
Glucose, Bld: 105 mg/dL — ABNORMAL HIGH (ref 70–99)
Potassium: 4.6 mmol/L (ref 3.5–5.1)
Sodium: 138 mmol/L (ref 135–145)
Total Bilirubin: 0.6 mg/dL (ref 0.3–1.2)
Total Protein: 6.9 g/dL (ref 6.5–8.1)

## 2022-11-16 LAB — MAGNESIUM: Magnesium: 2.1 mg/dL (ref 1.7–2.4)

## 2022-11-16 LAB — TSH: TSH: 2.363 u[IU]/mL (ref 0.350–4.500)

## 2022-11-16 MED ORDER — HEPARIN SOD (PORK) LOCK FLUSH 100 UNIT/ML IV SOLN
500.0000 [IU] | Freq: Once | INTRAVENOUS | Status: AC
  Administered 2022-11-16: 500 [IU] via INTRAVENOUS

## 2022-11-16 MED ORDER — SODIUM CHLORIDE 0.9% FLUSH
10.0000 mL | Freq: Once | INTRAVENOUS | Status: AC
  Administered 2022-11-16: 10 mL

## 2022-11-16 MED ORDER — LIDOCAINE-PRILOCAINE 2.5-2.5 % EX CREA: 1.0000 | TOPICAL_CREAM | CUTANEOUS | 0 refills | Status: DC | PRN

## 2022-11-16 MED ORDER — SODIUM CHLORIDE 0.9% FLUSH
10.0000 mL | Freq: Once | INTRAVENOUS | Status: AC
  Administered 2022-11-16: 10 mL via INTRAVENOUS

## 2022-11-16 MED ORDER — ACETAMINOPHEN 325 MG PO TABS
650.0000 mg | ORAL_TABLET | Freq: Once | ORAL | Status: AC
  Administered 2022-11-16: 650 mg via ORAL
  Filled 2022-11-16: qty 2

## 2022-11-16 MED ORDER — SODIUM CHLORIDE 0.9% IV SOLUTION
250.0000 mL | Freq: Once | INTRAVENOUS | Status: AC
  Administered 2022-11-16: 250 mL via INTRAVENOUS

## 2022-11-16 NOTE — Patient Instructions (Signed)
Platelet Transfusion A platelet transfusion is a procedure in which a person receives donated platelets through an IV. Platelets are parts of blood that stick together and form a clot to help the body stop bleeding after an injury. If you have too few platelets, your blood may have trouble clotting. This may cause you to bleed and bruise very easily. You may need a platelet transfusion if you have a condition that causes a low number of platelets (thrombocytopenia). A platelet transfusion may be used to stop or prevent excessive bleeding. Tell a health care provider about: Any reactions you have had during previous transfusions. Any allergies you have. All medicines you are taking, including vitamins, herbs, eye drops, creams, and over-the-counter medicines. Any bleeding problems you have. Any surgeries you have had. Any medical conditions you have. Whether you are pregnant or may be pregnant. What are the risks? Generally, this is a safe procedure. However, problems may occur, including: Fever. Infection. Allergic reaction to the donated (donor) platelets. Your body's disease-fighting system (immune system) attacking the donor platelets (hemolytic reaction). This is rare. A rare reaction that causes lung damage (transfusion-related acute lung injury). What happens before the procedure? Medicines Ask your health care provider about: Changing or stopping your regular medicines. This is especially important if you are taking diabetes medicines or blood thinners. Taking medicines such as aspirin and ibuprofen. These medicines can thin your blood. Do not take these medicines unless your health care provider tells you to take them. Taking over-the-counter medicines, vitamins, herbs, and supplements. General instructions You will have a blood test to determine your blood type. Your blood type determines what kind of platelets you will be given. Follow instructions from your health care provider  about eating or drinking restrictions. If you have had an allergic reaction to a transfusion in the past, you may be given medicine to help prevent a reaction. Your temperature, blood pressure, pulse, and breathing will be monitored. What happens during the procedure?  An IV will be inserted into one of your veins. For your safety, two health care providers will verify your identity along with the donor platelets about to be infused. A bag of donor platelets will be connected to your IV. The platelets will flow into your bloodstream. This usually takes 30-60 minutes. Your temperature, blood pressure, pulse, and breathing will be monitored during the transfusion. This helps detect early signs of any reaction. You will also be monitored for other symptoms that may indicate a reaction, including chills, hives, or itching. If you have signs of a reaction at any time, your transfusion will be stopped, and you may be given medicine to help manage the reaction. When your transfusion is complete, your IV will be removed. Pressure may be applied to the IV site for a few minutes to stop any bleeding. The IV site will be covered with a bandage (dressing). The procedure may vary among health care providers and hospitals. What can I expect after the procedure? Your blood pressure, temperature, pulse, and breathing will be monitored until you leave the hospital or clinic. You may have some bruising and soreness at your IV site. Follow these instructions at home: Medicines Take over-the-counter and prescription medicines only as told by your health care provider. Talk with your health care provider before you take any medicines that contain aspirin or NSAIDs, such as ibuprofen. These medicines increase your risk for dangerous bleeding. IV site care Check your IV site every day for signs of infection. Check for:   Redness, swelling, or pain. Fluid or blood. If fluid or blood drains from your IV site, use your  hands to press down firmly on a bandage covering the area for a minute or two. Doing this should stop the bleeding. Warmth. Pus or a bad smell. General instructions Change or remove your dressing as told by your health care provider. Return to your normal activities as told by your health care provider. Ask your health care provider what activities are safe for you. Do not take baths, swim, or use a hot tub until your health care provider approves. Ask your health care provider if you may take showers. Keep all follow-up visits. This is important. Contact a health care provider if: You have a headache that does not go away with medicine. You have hives, rash, or itchy skin. You have nausea or vomiting. You feel unusually tired or weak. You have signs of infection at your IV site. Get help right away if: You have a fever or chills. You urinate less often than usual. Your urine is darker colored than normal. You have any of the following: Trouble breathing. Pain in your back, abdomen, or chest. Cool, clammy skin. A fast heartbeat. Summary Platelets are tiny pieces of blood cells that clump together to form a blood clot when you have an injury. If you have too few platelets, your blood may have trouble clotting. A platelet transfusion is a procedure in which you receive donated platelets through an IV. A platelet transfusion may be used to stop or prevent excessive bleeding. After the procedure, check your IV site every day for signs of infection. This information is not intended to replace advice given to you by your health care provider. Make sure you discuss any questions you have with your health care provider. Document Revised: 07/29/2020 Document Reviewed: 07/29/2020 Elsevier Patient Education  2024 [employer]  

## 2022-11-16 NOTE — Progress Notes (Signed)
CRITICAL VALUE STICKER  CRITICAL VALUE: Plts 9, ANC 0.2  DATE & TIME NOTIFIED: 11/16/22 0959  MESSENGER (representative from lab):  MD NOTIFIED: Leonides Schanz  TIME OF NOTIFICATION:10:02

## 2022-11-16 NOTE — Progress Notes (Signed)
[facility] [phone]   [phone]  PROGRESS NOTE  Patient Care Team: [name], MD as PCP - General (Family Medicine) Otho Ket, RN as [facility] Care Management  Hematological/Oncological History # Acute Myeloid Leukemia  08/09/2022: seen in [facility] emergency department for leukocytosis and anemia/thrombocytopenia. 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 Transferred to [facility] 7/3-7/18/2024: Admitted to [facility], treated with Azacytidine and Venetoclax. Course complicated by neutropenic fever.  7/19-7/25/2024: admitted for fluid overload/heart failure.   Interval History:  [name] 82 y.o. female with medical history significant for AML who presents for a follow up visit. The patient's last visit was on 09/07/2022. In the interim since the last visit she been holding her AML medication at the request of Dr Lowella Bandy.   On exam today [name] reports clocks but that medication was held.  She was then on Ivosidenib therapy but was told to stop it on 10/19/2022.  She reports that she is currently on no medication for her AML.  She notes that she did have bone marrow biopsies performed on 9/16 and 9/19.  Unfortunately the 9/16 sample did not have adequate tissue.  She reports that the patient has been very active though she has been "shaking a lot".  She is also developed some small red bumps on her arms and legs.  She reports that she was started Zoloft for the shaking but was told it would take weeks for that to have a positive effect.  She has been doing her best to drink plenty of water and has been avoiding sodas.  She is not having an overt signs of bleeding, bruising, or dark stools.  She reports that she is also not having any runny nose, sore throat, or cough.  She does have some occasional light sinus drainage from allergies.  Overall she feels well and has no questions concerns or complaints today..  She currently denies any fevers, chills,  sweats, nausea vomiting or diarrhea.  A full 10 point ROS is otherwise negative.  MEDICAL HISTORY:  Past Medical History:  Diagnosis Date   Diverticulitis    Former smoker    Hypertension    Lateral meniscus tear 2017   left knee   Migraines    Osteoarthritis    Osteopenia    TGA (transient global amnesia)    secondary to [place]   Thyroid disease     SURGICAL HISTORY: Past Surgical History:  Procedure Laterality Date   DILATION AND CURETTAGE OF UTERUS     menorrhagia   KNEE ARTHROSCOPY Right 10/06   TUBAL LIGATION      SOCIAL HISTORY: Social History   Socioeconomic History   Marital status: Divorced    Spouse name: Not on file   Number of children: 0   Years of education: Not on file   Highest education level: Not on file  Occupational History    Employer: [employer]  Tobacco Use   Smoking status: Former   Smokeless tobacco: Never  Vaping Use   Vaping status: Never Used  Substance and Sexual Activity   Alcohol use: Yes    Comment: 1 every  few months   Drug use: No   Sexual activity: Not Currently    Partners: Male    Birth control/protection: Post-menopausal  Other Topics Concern   Not on file  Social History Narrative   Divorced.   High school graduate.   Previously worked at [employer] at [employer].   Lives alone  Has a [employer] as a second language teacher.   Social Determinants of Health   Financial Resource Strain: Not on file  Food Insecurity: No Food Insecurity (09/15/2022)   Hunger Vital Sign    Worried About Running Out of Food in the Last Year: Never true    Ran Out of Food in the Last Year: Never true  Transportation Needs: No Transportation Needs (09/15/2022)   PRAPARE - Administrator, Civil Service (Medical): No    Lack of Transportation (Non-Medical): No  Physical Activity: Not on file  Stress: Not on file  Social Connections: Not on file  Intimate Partner Violence: Not on file    FAMILY HISTORY: Family History  Problem  Relation Age of Onset   Heart disease Mother    Diabetes Father    Heart failure Father    COPD Father    Hyperlipidemia Sister    Diabetes Brother    Colon cancer Neg Hx    Breast cancer Neg Hx     ALLERGIES:  is allergic to [place] [zolpidem], codeine, dust mite extract, grass pollen(k-o-r-t-swt vern), hydrocodone, statins, sulfa antibiotics, and zofran [ondansetron].  MEDICATIONS:  Current Outpatient Medications  Medication Sig Dispense Refill   lidocaine-prilocaine (EMLA) cream Apply 1 Application topically as needed. 30 g 0   acyclovir (ZOVIRAX) 400 MG tablet Take 400 mg by mouth 2 (two) times daily.     ALPRAZolam (XANAX) 0.25 MG tablet TAKE 1 TABLET (0.25 MG TOTAL) BY MOUTH DAILY AS NEEDED. 30 tablet 3   Cyanocobalamin (VITAMIN B-12 PO) Take 1,000 mcg by mouth daily.     furosemide (LASIX) 20 MG tablet Take 20 mg by mouth daily. Prn for weight gain >5 lbs     granisetron (KYTRIL) 1 MG tablet Take by mouth.     levothyroxine (SYNTHROID) 50 MCG tablet Take 1 tablet (50 mcg total) by mouth daily. 90 tablet 3   losartan (COZAAR) 50 MG tablet Take 50 mg by mouth daily.     metoprolol succinate (TOPROL-XL) 25 MG 24 hr tablet Take 1 tablet by mouth daily.     polycarbophil (FIBERCON) 625 MG tablet Take 625 mg by mouth daily.     posaconazole (NOXAFIL) 100 MG TBEC delayed-release tablet Take 300 mg by mouth daily.     prochlorperazine (COMPAZINE) 5 MG tablet Take by mouth.     Red Yeast Rice 600 MG TABS Take 1 tablet (600 mg total) by mouth in the morning and at bedtime.     sertraline (ZOLOFT) 25 MG tablet Take 1 tablet (25 mg total) by mouth daily.     spironolactone (ALDACTONE) 25 MG tablet Take by mouth.     zinc sulfate 220 (50 Zn) MG capsule Take 220 mg by mouth daily.     No current facility-administered medications for this visit.    REVIEW OF SYSTEMS:   Constitutional: ( - ) fevers, ( - )  chills , ( - ) night sweats Eyes: ( - ) blurriness of vision, ( - ) double vision,  ( - ) watery eyes Ears, nose, mouth, throat, and face: ( - ) mucositis, ( - ) sore throat Respiratory: ( - ) cough, ( - ) dyspnea, ( - ) wheezes Cardiovascular: ( - ) palpitation, ( - ) chest discomfort, ( - ) lower extremity swelling Gastrointestinal:  ( - ) nausea, ( - ) heartburn, ( - ) change in bowel habits Skin: ( - ) abnormal skin rashes Lymphatics: ( - ) new lymphadenopathy, ( - )  easy bruising Neurological: ( - ) numbness, ( - ) tingling, ( - ) new weaknesses Behavioral/Psych: ( - ) mood change, ( - ) new changes  All other systems were reviewed with the patient and are negative.  PHYSICAL EXAMINATION: ECOG PERFORMANCE STATUS: 2 - Symptomatic, <50% confined to bed  Vitals:   11/16/22 1001  BP: (!) 127/57  Pulse: (!) 59  Resp: 16  Temp: 98.2 F (36.8 C)  SpO2: 100%    Filed Weights   11/16/22 1001  Weight: 120 lb 12.8 oz (54.8 kg)     GENERAL: Well-appearing elderly Caucasian female alert, no distress and comfortable SKIN: skin color, texture, turgor are normal, no rashes or significant lesions EYES: conjunctiva are pink and non-injected, sclera clear LUNGS: clear to auscultation and percussion with normal breathing effort HEART: regular rate & rhythm and no murmurs and no lower extremity edema Musculoskeletal: no cyanosis of digits and no clubbing  PSYCH: alert & oriented x 3, fluent speech NEURO: no focal motor/sensory deficits  LABORATORY DATA:  I have reviewed the data as listed    Latest Ref Rng & Units 11/16/2022    9:23 AM 11/13/2022    7:44 AM 11/09/2022   12:34 PM  CBC  WBC 4.0 - 10.5 K/uL 1.5  1.4  1.7   Hemoglobin 12.0 - 15.0 g/dL 9.5  9.6  9.7   Hematocrit 36.0 - 46.0 % 27.2  28.0  26.8   Platelets 150 - 400 K/uL 9  24  14         Latest Ref Rng & Units 11/16/2022    9:23 AM 11/13/2022    7:44 AM 11/09/2022   12:34 PM  CMP  Glucose 70 - 99 mg/dL 409  811  914   BUN 8 - 23 mg/dL 34  32  35   Creatinine 0.44 - 1.00 mg/dL 7.82  9.56  2.13    Sodium 135 - 145 mmol/L 138  138  137   Potassium 3.5 - 5.1 mmol/L 4.6  4.7  4.5   Chloride 98 - 111 mmol/L 104  103  102   CO2 22 - 32 mmol/L 27  27  28    Calcium 8.9 - 10.3 mg/dL 08.6  57.8  46.9   Total Protein 6.5 - 8.1 g/dL 6.9  7.0  7.1   Total Bilirubin 0.3 - 1.2 mg/dL 0.6  0.5  0.9   Alkaline Phos 38 - 126 U/L 59  65  66   AST 15 - 41 U/L 21  19  21    ALT 0 - 44 U/L 17  14  14      RADIOGRAPHIC STUDIES: No results found.  ASSESSMENT & PLAN [name] 82 y.o. female with medical history significant for AML who presents for a follow up visit.  Today we discussed the concept of comanaged care.  Comanaged care is when the patient has a local primary provider who administers local support and therapy while expert advice and treatment recommendations are rendered by a cancer specialist at a large academic center.  In this arrangement we provide local support, labs, treatment, and emergency visits, however the major decisions regarding the course of treatment are decided by a physician at an academic medical center.  The patient voiced her understanding of comanaged care and was agreeable to proceeding forward with this care model.  # Acute Myeloid Leukemia  -- Patient currently follows with Dr. Lowella Bandy at [facility].  Her AML therapy is currently  on hold.  She will be seeing Dr. Lowella Bandy a next week to reevaluate -- We will provide supportive care as requested with labs on Mondays and Thursdays with transfusion as necessary -- Labs today show white blood cell count 1.5, hemoglobin 9.5, MCV 98.2, and platelets of 9.  Will receive a platelet transfusion today. -- Recommend twice weekly lab checks with a return clinic visit in 8 weeks time  No orders of the defined types were placed in this encounter.   All questions were answered. The patient knows to call the clinic with any problems, questions or concerns.  A total of more than 30 minutes were spent on this  encounter with face-to-face time and non-face-to-face time, including preparing to see the patient, ordering tests and/or medications, counseling the patient and coordination of care as outlined above.   Ulysees Barns, MD Department of Hematology/Oncology [facility] at [facility] Phone: [phone] Pager: [phone] Email: [email or web address]  11/16/2022 5:59 PM

## 2022-11-17 ENCOUNTER — Telehealth: Payer: Self-pay | Admitting: Hematology and Oncology

## 2022-11-17 LAB — BPAM PLATELET PHERESIS
Blood Product Expiration Date: 202410112359
ISSUE DATE / TIME: 202410101103
Unit Type and Rh: 6200

## 2022-11-17 LAB — PREPARE PLATELET PHERESIS: Unit division: 0

## 2022-11-17 LAB — T4: T4, Total: 7.1 ug/dL (ref 4.5–12.0)

## 2022-11-17 LAB — TYPE AND SCREEN
ABO/RH(D): A POS
Antibody Screen: NEGATIVE

## 2022-11-20 ENCOUNTER — Other Ambulatory Visit: Payer: Self-pay

## 2022-11-20 ENCOUNTER — Inpatient Hospital Stay: Payer: Medicare Other

## 2022-11-20 ENCOUNTER — Telehealth: Payer: Self-pay

## 2022-11-20 VITALS — BP 115/51 | HR 56 | Temp 98.2°F | Resp 16

## 2022-11-20 DIAGNOSIS — C9200 Acute myeloblastic leukemia, not having achieved remission: Secondary | ICD-10-CM | POA: Diagnosis not present

## 2022-11-20 DIAGNOSIS — E079 Disorder of thyroid, unspecified: Secondary | ICD-10-CM | POA: Diagnosis not present

## 2022-11-20 LAB — CBC WITH DIFFERENTIAL (CANCER CENTER ONLY)
Abs Immature Granulocytes: 0 10*3/uL (ref 0.00–0.07)
Basophils Absolute: 0 10*3/uL (ref 0.0–0.1)
Basophils Relative: 0 %
Eosinophils Absolute: 0 10*3/uL (ref 0.0–0.5)
Eosinophils Relative: 1 %
HCT: 24 % — ABNORMAL LOW (ref 36.0–46.0)
Hemoglobin: 8.7 g/dL — ABNORMAL LOW (ref 12.0–15.0)
Immature Granulocytes: 0 %
Lymphocytes Relative: 77 %
Lymphs Abs: 1.5 10*3/uL (ref 0.7–4.0)
MCH: 35.5 pg — ABNORMAL HIGH (ref 26.0–34.0)
MCHC: 36.3 g/dL — ABNORMAL HIGH (ref 30.0–36.0)
MCV: 98 fL (ref 80.0–100.0)
Monocytes Absolute: 0.1 10*3/uL (ref 0.1–1.0)
Monocytes Relative: 7 %
Neutro Abs: 0.3 10*3/uL — CL (ref 1.7–7.7)
Neutrophils Relative %: 15 %
Platelet Count: 18 10*3/uL — ABNORMAL LOW (ref 150–400)
RBC: 2.45 MIL/uL — ABNORMAL LOW (ref 3.87–5.11)
RDW: 15.5 % (ref 11.5–15.5)
Smear Review: DECREASED
WBC Count: 1.8 10*3/uL — ABNORMAL LOW (ref 4.0–10.5)
nRBC: 0 % (ref 0.0–0.2)

## 2022-11-20 LAB — CMP (CANCER CENTER ONLY)
ALT: 15 U/L (ref 0–44)
AST: 18 U/L (ref 15–41)
Albumin: 4 g/dL (ref 3.5–5.0)
Alkaline Phosphatase: 64 U/L (ref 38–126)
Anion gap: 6 (ref 5–15)
BUN: 27 mg/dL — ABNORMAL HIGH (ref 8–23)
CO2: 27 mmol/L (ref 22–32)
Calcium: 9.7 mg/dL (ref 8.9–10.3)
Chloride: 105 mmol/L (ref 98–111)
Creatinine: 1.1 mg/dL — ABNORMAL HIGH (ref 0.44–1.00)
GFR, Estimated: 51 mL/min — ABNORMAL LOW (ref >60)
Glucose, Bld: 104 mg/dL — ABNORMAL HIGH (ref 70–99)
Potassium: 4.3 mmol/L (ref 3.5–5.1)
Sodium: 138 mmol/L (ref 135–145)
Total Bilirubin: 0.4 mg/dL (ref 0.3–1.2)
Total Protein: 6.6 g/dL (ref 6.5–8.1)

## 2022-11-20 LAB — MAGNESIUM: Magnesium: 1.9 mg/dL (ref 1.7–2.4)

## 2022-11-20 LAB — SAMPLE TO BLOOD BANK

## 2022-11-20 MED ORDER — SODIUM CHLORIDE 0.9% FLUSH
10.0000 mL | Freq: Once | INTRAVENOUS | Status: AC
  Administered 2022-11-20: 10 mL

## 2022-11-20 MED ORDER — SODIUM CHLORIDE 0.9% IV SOLUTION
250.0000 mL | Freq: Once | INTRAVENOUS | Status: AC
  Administered 2022-11-20: 250 mL via INTRAVENOUS

## 2022-11-20 MED ORDER — HEPARIN SOD (PORK) LOCK FLUSH 100 UNIT/ML IV SOLN
500.0000 [IU] | Freq: Once | INTRAVENOUS | Status: AC
  Administered 2022-11-20: 500 [IU] via INTRAVENOUS

## 2022-11-20 MED ORDER — ACETAMINOPHEN 325 MG PO TABS
650.0000 mg | ORAL_TABLET | Freq: Once | ORAL | Status: AC
  Administered 2022-11-20: 650 mg via ORAL
  Filled 2022-11-20: qty 2

## 2022-11-20 NOTE — Telephone Encounter (Signed)
CRITICAL VALUE STICKER  CRITICAL VALUE: ANC 0.3   RECEIVER (on-site recipient of call): Prescott Gum  DATE & TIME NOTIFIED: 11/20/22 1457  MESSENGER (representative from lab): Amber  MD NOTIFIED: Dr. Leonides Schanz  TIME OF NOTIFICATION: 11/20/22 1507  RESPONSE: No intervention needed at this time.

## 2022-11-20 NOTE — Patient Instructions (Signed)
Platelet Transfusion A platelet transfusion is a procedure in which a person receives donated platelets through an IV. Platelets are parts of blood that stick together and form a clot to help the body stop bleeding after an injury. If you have too few platelets, your blood may have trouble clotting. This may cause you to bleed and bruise very easily. You may need a platelet transfusion if you have a condition that causes a low number of platelets (thrombocytopenia). A platelet transfusion may be used to stop or prevent excessive bleeding. Tell a health care provider about: Any reactions you have had during previous transfusions. Any allergies you have. All medicines you are taking, including vitamins, herbs, eye drops, creams, and over-the-counter medicines. Any bleeding problems you have. Any surgeries you have had. Any medical conditions you have. Whether you are pregnant or may be pregnant. What are the risks? Generally, this is a safe procedure. However, problems may occur, including: Fever. Infection. Allergic reaction to the donated (donor) platelets. Your body's disease-fighting system (immune system) attacking the donor platelets (hemolytic reaction). This is rare. A rare reaction that causes lung damage (transfusion-related acute lung injury). What happens before the procedure? Medicines Ask your health care provider about: Changing or stopping your regular medicines. This is especially important if you are taking diabetes medicines or blood thinners. Taking medicines such as aspirin and ibuprofen. These medicines can thin your blood. Do not take these medicines unless your health care provider tells you to take them. Taking over-the-counter medicines, vitamins, herbs, and supplements. General instructions You will have a blood test to determine your blood type. Your blood type determines what kind of platelets you will be given. Follow instructions from your health care provider  about eating or drinking restrictions. If you have had an allergic reaction to a transfusion in the past, you may be given medicine to help prevent a reaction. Your temperature, blood pressure, pulse, and breathing will be monitored. What happens during the procedure?  An IV will be inserted into one of your veins. For your safety, two health care providers will verify your identity along with the donor platelets about to be infused. A bag of donor platelets will be connected to your IV. The platelets will flow into your bloodstream. This usually takes 30-60 minutes. Your temperature, blood pressure, pulse, and breathing will be monitored during the transfusion. This helps detect early signs of any reaction. You will also be monitored for other symptoms that may indicate a reaction, including chills, hives, or itching. If you have signs of a reaction at any time, your transfusion will be stopped, and you may be given medicine to help manage the reaction. When your transfusion is complete, your IV will be removed. Pressure may be applied to the IV site for a few minutes to stop any bleeding. The IV site will be covered with a bandage (dressing). The procedure may vary among health care providers and hospitals. What can I expect after the procedure? Your blood pressure, temperature, pulse, and breathing will be monitored until you leave the hospital or clinic. You may have some bruising and soreness at your IV site. Follow these instructions at home: Medicines Take over-the-counter and prescription medicines only as told by your health care provider. Talk with your health care provider before you take any medicines that contain aspirin or NSAIDs, such as ibuprofen. These medicines increase your risk for dangerous bleeding. IV site care Check your IV site every day for signs of infection. Check for:   Redness, swelling, or pain. Fluid or blood. If fluid or blood drains from your IV site, use your  hands to press down firmly on a bandage covering the area for a minute or two. Doing this should stop the bleeding. Warmth. Pus or a bad smell. General instructions Change or remove your dressing as told by your health care provider. Return to your normal activities as told by your health care provider. Ask your health care provider what activities are safe for you. Do not take baths, swim, or use a hot tub until your health care provider approves. Ask your health care provider if you may take showers. Keep all follow-up visits. This is important. Contact a health care provider if: You have a headache that does not go away with medicine. You have hives, rash, or itchy skin. You have nausea or vomiting. You feel unusually tired or weak. You have signs of infection at your IV site. Get help right away if: You have a fever or chills. You urinate less often than usual. Your urine is darker colored than normal. You have any of the following: Trouble breathing. Pain in your back, abdomen, or chest. Cool, clammy skin. A fast heartbeat. Summary Platelets are tiny pieces of blood cells that clump together to form a blood clot when you have an injury. If you have too few platelets, your blood may have trouble clotting. A platelet transfusion is a procedure in which you receive donated platelets through an IV. A platelet transfusion may be used to stop or prevent excessive bleeding. After the procedure, check your IV site every day for signs of infection. This information is not intended to replace advice given to you by your health care provider. Make sure you discuss any questions you have with your health care provider. Document Revised: 07/29/2020 Document Reviewed: 07/29/2020 Elsevier Patient Education  2024 [employer]  

## 2022-11-21 LAB — BPAM PLATELET PHERESIS
Blood Product Expiration Date: 202410142359
ISSUE DATE / TIME: 202410141518
Unit Type and Rh: 6200

## 2022-11-21 LAB — PREPARE PLATELET PHERESIS: Unit division: 0

## 2022-11-23 ENCOUNTER — Inpatient Hospital Stay: Payer: Medicare Other

## 2022-11-23 DIAGNOSIS — C9200 Acute myeloblastic leukemia, not having achieved remission: Secondary | ICD-10-CM | POA: Diagnosis not present

## 2022-11-23 DIAGNOSIS — D6949 Other primary thrombocytopenia: Secondary | ICD-10-CM | POA: Diagnosis not present

## 2022-11-23 DIAGNOSIS — R251 Tremor, unspecified: Secondary | ICD-10-CM | POA: Diagnosis not present

## 2022-11-24 ENCOUNTER — Telehealth: Payer: Self-pay | Admitting: Surgery

## 2022-11-24 NOTE — Telephone Encounter (Signed)
Called patient to see if she could come earlier for her appointment on Monday 10/21. Patient agreed to come at 12pm for port flush/labs and [facility] at 12:30pm. No other concerns at this time.

## 2022-11-27 ENCOUNTER — Telehealth: Payer: Self-pay

## 2022-11-27 ENCOUNTER — Inpatient Hospital Stay: Payer: Medicare Other

## 2022-11-27 ENCOUNTER — Other Ambulatory Visit: Payer: Self-pay | Admitting: *Deleted

## 2022-11-27 VITALS — BP 108/46 | HR 57 | Temp 98.7°F | Resp 16 | Wt 123.9 lb

## 2022-11-27 DIAGNOSIS — C9200 Acute myeloblastic leukemia, not having achieved remission: Secondary | ICD-10-CM

## 2022-11-27 DIAGNOSIS — Z95828 Presence of other vascular implants and grafts: Secondary | ICD-10-CM

## 2022-11-27 DIAGNOSIS — E079 Disorder of thyroid, unspecified: Secondary | ICD-10-CM | POA: Diagnosis not present

## 2022-11-27 LAB — CBC WITH DIFFERENTIAL (CANCER CENTER ONLY)
Abs Immature Granulocytes: 0.01 10*3/uL (ref 0.00–0.07)
Basophils Absolute: 0 10*3/uL (ref 0.0–0.1)
Basophils Relative: 0 %
Eosinophils Absolute: 0 10*3/uL (ref 0.0–0.5)
Eosinophils Relative: 1 %
HCT: 23.1 % — ABNORMAL LOW (ref 36.0–46.0)
Hemoglobin: 8.1 g/dL — ABNORMAL LOW (ref 12.0–15.0)
Immature Granulocytes: 1 %
Lymphocytes Relative: 74 %
Lymphs Abs: 1.6 10*3/uL (ref 0.7–4.0)
MCH: 34.3 pg — ABNORMAL HIGH (ref 26.0–34.0)
MCHC: 35.1 g/dL (ref 30.0–36.0)
MCV: 97.9 fL (ref 80.0–100.0)
Monocytes Absolute: 0.2 10*3/uL (ref 0.1–1.0)
Monocytes Relative: 8 %
Neutro Abs: 0.3 10*3/uL — CL (ref 1.7–7.7)
Neutrophils Relative %: 16 %
Platelet Count: 6 10*3/uL — CL (ref 150–400)
RBC: 2.36 MIL/uL — ABNORMAL LOW (ref 3.87–5.11)
RDW: 15.9 % — ABNORMAL HIGH (ref 11.5–15.5)
Smear Review: NORMAL
WBC Count: 2.1 10*3/uL — ABNORMAL LOW (ref 4.0–10.5)
nRBC: 0 % (ref 0.0–0.2)

## 2022-11-27 LAB — MAGNESIUM: Magnesium: 2.1 mg/dL (ref 1.7–2.4)

## 2022-11-27 LAB — CMP (CANCER CENTER ONLY)
ALT: 20 U/L (ref 0–44)
AST: 22 U/L (ref 15–41)
Albumin: 4 g/dL (ref 3.5–5.0)
Alkaline Phosphatase: 59 U/L (ref 38–126)
Anion gap: 6 (ref 5–15)
BUN: 39 mg/dL — ABNORMAL HIGH (ref 8–23)
CO2: 28 mmol/L (ref 22–32)
Calcium: 10 mg/dL (ref 8.9–10.3)
Chloride: 104 mmol/L (ref 98–111)
Creatinine: 1.28 mg/dL — ABNORMAL HIGH (ref 0.44–1.00)
GFR, Estimated: 43 mL/min — ABNORMAL LOW (ref >60)
Glucose, Bld: 111 mg/dL — ABNORMAL HIGH (ref 70–99)
Potassium: 4.8 mmol/L (ref 3.5–5.1)
Sodium: 138 mmol/L (ref 135–145)
Total Bilirubin: 0.5 mg/dL (ref 0.3–1.2)
Total Protein: 6.8 g/dL (ref 6.5–8.1)

## 2022-11-27 LAB — SAMPLE TO BLOOD BANK

## 2022-11-27 LAB — PREPARE RBC (CROSSMATCH)

## 2022-11-27 MED ORDER — SODIUM CHLORIDE 0.9% FLUSH
10.0000 mL | Freq: Once | INTRAVENOUS | Status: AC
Start: 2022-11-27 — End: 2022-11-27
  Administered 2022-11-27: 10 mL

## 2022-11-27 MED ORDER — HEPARIN SOD (PORK) LOCK FLUSH 100 UNIT/ML IV SOLN
500.0000 [IU] | Freq: Once | INTRAVENOUS | Status: AC
  Administered 2022-11-27: 500 [IU] via INTRAVENOUS

## 2022-11-27 MED ORDER — SODIUM CHLORIDE 0.9% FLUSH
10.0000 mL | Freq: Once | INTRAVENOUS | Status: AC
  Administered 2022-11-27: 10 mL

## 2022-11-27 MED ORDER — ACETAMINOPHEN 325 MG PO TABS
650.0000 mg | ORAL_TABLET | Freq: Once | ORAL | Status: AC
  Administered 2022-11-27: 650 mg via ORAL
  Filled 2022-11-27: qty 2

## 2022-11-27 NOTE — Telephone Encounter (Signed)
CRITICAL VALUE STICKER  CRITICAL VALUE: ANC 0.3  RECEIVER (on-site recipient of call): Leona Singleton, RN  DATE & TIME NOTIFIED: 11/27/22 at 1322  MESSENGER (representative from lab): Shanda Bumps  MD NOTIFIED: Dr. Leonides Schanz  TIME OF NOTIFICATION: 1323  RESPONSE:

## 2022-11-27 NOTE — Patient Instructions (Signed)

## 2022-11-28 LAB — PREPARE PLATELET PHERESIS: Unit division: 0

## 2022-11-28 LAB — BPAM PLATELET PHERESIS
Blood Product Expiration Date: 202410232359
ISSUE DATE / TIME: 202410211350
Unit Type and Rh: 600

## 2022-11-28 LAB — TYPE AND SCREEN
ABO/RH(D): A POS
Antibody Screen: NEGATIVE
Unit division: 0

## 2022-11-28 LAB — BPAM RBC
Blood Product Expiration Date: 202411102359
ISSUE DATE / TIME: 202410211519
Unit Type and Rh: 6200

## 2022-11-30 ENCOUNTER — Inpatient Hospital Stay: Payer: Medicare Other

## 2022-11-30 ENCOUNTER — Telehealth: Payer: Self-pay | Admitting: *Deleted

## 2022-11-30 DIAGNOSIS — E079 Disorder of thyroid, unspecified: Secondary | ICD-10-CM | POA: Diagnosis not present

## 2022-11-30 DIAGNOSIS — C9200 Acute myeloblastic leukemia, not having achieved remission: Secondary | ICD-10-CM | POA: Diagnosis not present

## 2022-11-30 LAB — CBC WITH DIFFERENTIAL (CANCER CENTER ONLY)
Abs Immature Granulocytes: 0.01 10*3/uL (ref 0.00–0.07)
Basophils Absolute: 0 10*3/uL (ref 0.0–0.1)
Basophils Relative: 0 %
Eosinophils Absolute: 0 10*3/uL (ref 0.0–0.5)
Eosinophils Relative: 2 %
HCT: 26.2 % — ABNORMAL LOW (ref 36.0–46.0)
Hemoglobin: 9.4 g/dL — ABNORMAL LOW (ref 12.0–15.0)
Immature Granulocytes: 1 %
Lymphocytes Relative: 69 %
Lymphs Abs: 1.3 10*3/uL (ref 0.7–4.0)
MCH: 34.1 pg — ABNORMAL HIGH (ref 26.0–34.0)
MCHC: 35.9 g/dL (ref 30.0–36.0)
MCV: 94.9 fL (ref 80.0–100.0)
Monocytes Absolute: 0.2 10*3/uL (ref 0.1–1.0)
Monocytes Relative: 9 %
Neutro Abs: 0.4 10*3/uL — CL (ref 1.7–7.7)
Neutrophils Relative %: 19 %
Platelet Count: 20 10*3/uL — ABNORMAL LOW (ref 150–400)
RBC: 2.76 MIL/uL — ABNORMAL LOW (ref 3.87–5.11)
RDW: 15.9 % — ABNORMAL HIGH (ref 11.5–15.5)
WBC Count: 1.9 10*3/uL — ABNORMAL LOW (ref 4.0–10.5)
nRBC: 0 % (ref 0.0–0.2)

## 2022-11-30 LAB — SAMPLE TO BLOOD BANK

## 2022-11-30 LAB — CMP (CANCER CENTER ONLY)
ALT: 21 U/L (ref 0–44)
AST: 24 U/L (ref 15–41)
Albumin: 3.9 g/dL (ref 3.5–5.0)
Alkaline Phosphatase: 67 U/L (ref 38–126)
Anion gap: 7 (ref 5–15)
BUN: 27 mg/dL — ABNORMAL HIGH (ref 8–23)
CO2: 27 mmol/L (ref 22–32)
Calcium: 9.6 mg/dL (ref 8.9–10.3)
Chloride: 102 mmol/L (ref 98–111)
Creatinine: 0.89 mg/dL (ref 0.44–1.00)
GFR, Estimated: >60 mL/min (ref >60)
Glucose, Bld: 102 mg/dL — ABNORMAL HIGH (ref 70–99)
Potassium: 4.3 mmol/L (ref 3.5–5.1)
Sodium: 136 mmol/L (ref 135–145)
Total Bilirubin: 0.5 mg/dL (ref 0.3–1.2)
Total Protein: 6.7 g/dL (ref 6.5–8.1)

## 2022-11-30 LAB — MAGNESIUM: Magnesium: 2.1 mg/dL (ref 1.7–2.4)

## 2022-11-30 MED ORDER — SODIUM CHLORIDE 0.9% FLUSH
10.0000 mL | Freq: Once | INTRAVENOUS | Status: AC
Start: 2022-11-30 — End: 2022-11-30
  Administered 2022-11-30: 10 mL

## 2022-11-30 NOTE — Telephone Encounter (Signed)
CRITICAL VALUE STICKER  CRITICAL VALUE: ANC 0.4  RECEIVER (on-site recipient of call): Binnie Rail, RN  DATE & TIME NOTIFIED: 11/30/22  1328  MESSENGER (representative from lab): Lanora Manis  MD NOTIFIED: Dr. Leonides Schanz  TIME OF NOTIFICATION: 1330  RESPONSE:  No change to treatment plan . Neutropenia precautions

## 2022-12-04 ENCOUNTER — Other Ambulatory Visit: Payer: Self-pay | Admitting: *Deleted

## 2022-12-04 ENCOUNTER — Inpatient Hospital Stay: Payer: Medicare Other

## 2022-12-04 ENCOUNTER — Other Ambulatory Visit: Payer: Self-pay

## 2022-12-04 DIAGNOSIS — C9200 Acute myeloblastic leukemia, not having achieved remission: Secondary | ICD-10-CM

## 2022-12-04 DIAGNOSIS — E079 Disorder of thyroid, unspecified: Secondary | ICD-10-CM | POA: Diagnosis not present

## 2022-12-04 LAB — CBC WITH DIFFERENTIAL (CANCER CENTER ONLY)
Abs Immature Granulocytes: 0 10*3/uL (ref 0.00–0.07)
Basophils Absolute: 0 10*3/uL (ref 0.0–0.1)
Basophils Relative: 0 %
Eosinophils Absolute: 0 10*3/uL (ref 0.0–0.5)
Eosinophils Relative: 2 %
HCT: 26.2 % — ABNORMAL LOW (ref 36.0–46.0)
Hemoglobin: 9.3 g/dL — ABNORMAL LOW (ref 12.0–15.0)
Immature Granulocytes: 0 %
Lymphocytes Relative: 71 %
Lymphs Abs: 1.5 10*3/uL (ref 0.7–4.0)
MCH: 33.8 pg (ref 26.0–34.0)
MCHC: 35.5 g/dL (ref 30.0–36.0)
MCV: 95.3 fL (ref 80.0–100.0)
Monocytes Absolute: 0.1 10*3/uL (ref 0.1–1.0)
Monocytes Relative: 7 %
Neutro Abs: 0.4 10*3/uL — CL (ref 1.7–7.7)
Neutrophils Relative %: 20 %
Platelet Count: 8 10*3/uL — CL (ref 150–400)
RBC: 2.75 MIL/uL — ABNORMAL LOW (ref 3.87–5.11)
RDW: 15.4 % (ref 11.5–15.5)
Smear Review: NORMAL
WBC Count: 2.1 10*3/uL — ABNORMAL LOW (ref 4.0–10.5)
nRBC: 1 % — ABNORMAL HIGH (ref 0.0–0.2)

## 2022-12-04 LAB — MAGNESIUM: Magnesium: 2 mg/dL (ref 1.7–2.4)

## 2022-12-04 LAB — SAMPLE TO BLOOD BANK

## 2022-12-04 LAB — CMP (CANCER CENTER ONLY)
ALT: 23 U/L (ref 0–44)
AST: 22 U/L (ref 15–41)
Albumin: 4 g/dL (ref 3.5–5.0)
Alkaline Phosphatase: 66 U/L (ref 38–126)
Anion gap: 5 (ref 5–15)
BUN: 29 mg/dL — ABNORMAL HIGH (ref 8–23)
CO2: 29 mmol/L (ref 22–32)
Calcium: 9.8 mg/dL (ref 8.9–10.3)
Chloride: 106 mmol/L (ref 98–111)
Creatinine: 1.03 mg/dL — ABNORMAL HIGH (ref 0.44–1.00)
GFR, Estimated: 55 mL/min — ABNORMAL LOW (ref >60)
Glucose, Bld: 112 mg/dL — ABNORMAL HIGH (ref 70–99)
Potassium: 4.4 mmol/L (ref 3.5–5.1)
Sodium: 140 mmol/L (ref 135–145)
Total Bilirubin: 0.5 mg/dL (ref 0.3–1.2)
Total Protein: 6.7 g/dL (ref 6.5–8.1)

## 2022-12-04 MED ORDER — SODIUM CHLORIDE 0.9% FLUSH
10.0000 mL | INTRAVENOUS | Status: AC | PRN
  Administered 2022-12-04: 10 mL

## 2022-12-04 MED ORDER — ACETAMINOPHEN 325 MG PO TABS
650.0000 mg | ORAL_TABLET | Freq: Once | ORAL | Status: AC
  Administered 2022-12-04: 650 mg via ORAL
  Filled 2022-12-04: qty 2

## 2022-12-04 MED ORDER — SODIUM CHLORIDE 0.9% IV SOLUTION
250.0000 mL | INTRAVENOUS | Status: DC
  Administered 2022-12-04: 100 mL via INTRAVENOUS

## 2022-12-04 MED ORDER — HEPARIN SOD (PORK) LOCK FLUSH 100 UNIT/ML IV SOLN
500.0000 [IU] | Freq: Every day | INTRAVENOUS | Status: AC | PRN
  Administered 2022-12-04: 500 [IU]

## 2022-12-04 MED ORDER — SODIUM CHLORIDE 0.9% FLUSH
10.0000 mL | Freq: Once | INTRAVENOUS | Status: AC
  Administered 2022-12-04: 10 mL

## 2022-12-04 NOTE — Patient Instructions (Signed)
Platelet Transfusion A platelet transfusion is a procedure in which a person receives donated platelets through an IV. Platelets are parts of blood that stick together and form a clot to help the body stop bleeding after an injury. If you have too few platelets, your blood may have trouble clotting. This may cause you to bleed and bruise very easily. You may need a platelet transfusion if you have a condition that causes a low number of platelets (thrombocytopenia). A platelet transfusion may be used to stop or prevent excessive bleeding. Tell a health care provider about: Any reactions you have had during previous transfusions. Any allergies you have. All medicines you are taking, including vitamins, herbs, eye drops, creams, and over-the-counter medicines. Any bleeding problems you have. Any surgeries you have had. Any medical conditions you have. Whether you are pregnant or may be pregnant. What are the risks? Generally, this is a safe procedure. However, problems may occur, including: Fever. Infection. Allergic reaction to the donated (donor) platelets. Your body's disease-fighting system (immune system) attacking the donor platelets (hemolytic reaction). This is rare. A rare reaction that causes lung damage (transfusion-related acute lung injury). What happens before the procedure? Medicines Ask your health care provider about: Changing or stopping your regular medicines. This is especially important if you are taking diabetes medicines or blood thinners. Taking medicines such as aspirin and ibuprofen. These medicines can thin your blood. Do not take these medicines unless your health care provider tells you to take them. Taking over-the-counter medicines, vitamins, herbs, and supplements. General instructions You will have a blood test to determine your blood type. Your blood type determines what kind of platelets you will be given. Follow instructions from your health care provider  about eating or drinking restrictions. If you have had an allergic reaction to a transfusion in the past, you may be given medicine to help prevent a reaction. Your temperature, blood pressure, pulse, and breathing will be monitored. What happens during the procedure?  An IV will be inserted into one of your veins. For your safety, two health care providers will verify your identity along with the donor platelets about to be infused. A bag of donor platelets will be connected to your IV. The platelets will flow into your bloodstream. This usually takes 30-60 minutes. Your temperature, blood pressure, pulse, and breathing will be monitored during the transfusion. This helps detect early signs of any reaction. You will also be monitored for other symptoms that may indicate a reaction, including chills, hives, or itching. If you have signs of a reaction at any time, your transfusion will be stopped, and you may be given medicine to help manage the reaction. When your transfusion is complete, your IV will be removed. Pressure may be applied to the IV site for a few minutes to stop any bleeding. The IV site will be covered with a bandage (dressing). The procedure may vary among health care providers and hospitals. What can I expect after the procedure? Your blood pressure, temperature, pulse, and breathing will be monitored until you leave the hospital or clinic. You may have some bruising and soreness at your IV site. Follow these instructions at home: Medicines Take over-the-counter and prescription medicines only as told by your health care provider. Talk with your health care provider before you take any medicines that contain aspirin or NSAIDs, such as ibuprofen. These medicines increase your risk for dangerous bleeding. IV site care Check your IV site every day for signs of infection. Check for:   Redness, swelling, or pain. Fluid or blood. If fluid or blood drains from your IV site, use your  hands to press down firmly on a bandage covering the area for a minute or two. Doing this should stop the bleeding. Warmth. Pus or a bad smell. General instructions Change or remove your dressing as told by your health care provider. Return to your normal activities as told by your health care provider. Ask your health care provider what activities are safe for you. Do not take baths, swim, or use a hot tub until your health care provider approves. Ask your health care provider if you may take showers. Keep all follow-up visits. This is important. Contact a health care provider if: You have a headache that does not go away with medicine. You have hives, rash, or itchy skin. You have nausea or vomiting. You feel unusually tired or weak. You have signs of infection at your IV site. Get help right away if: You have a fever or chills. You urinate less often than usual. Your urine is darker colored than normal. You have any of the following: Trouble breathing. Pain in your back, abdomen, or chest. Cool, clammy skin. A fast heartbeat. Summary Platelets are tiny pieces of blood cells that clump together to form a blood clot when you have an injury. If you have too few platelets, your blood may have trouble clotting. A platelet transfusion is a procedure in which you receive donated platelets through an IV. A platelet transfusion may be used to stop or prevent excessive bleeding. After the procedure, check your IV site every day for signs of infection. This information is not intended to replace advice given to you by your health care provider. Make sure you discuss any questions you have with your health care provider. Document Revised: 07/29/2020 Document Reviewed: 07/29/2020 Elsevier Patient Education  2024 [employer]  

## 2022-12-05 ENCOUNTER — Ambulatory Visit: Payer: Self-pay

## 2022-12-05 LAB — BPAM PLATELET PHERESIS
Blood Product Expiration Date: 202410302359
ISSUE DATE / TIME: 202410281434
Unit Type and Rh: 5100

## 2022-12-05 LAB — PREPARE PLATELET PHERESIS: Unit division: 0

## 2022-12-05 NOTE — Patient Outreach (Signed)
Care Coordination   12/05/2022 Name: [name] MRN: [id number] DOB: 12-05-1940   Care Coordination Outreach Attempts:  An unsuccessful telephone outreach was attempted for a scheduled appointment today. Attempted call to listed home and mobile number. Unable to leave message at home number.   Phone only rang.  Message left on mobile phone with request for return call.   Follow Up Plan:  Additional outreach attempts will be made to offer the patient care coordination information and services.   Encounter Outcome:  No Answer   Care Coordination Interventions:  No, not indicated    [name] RN,BSN,CCM [facility] coordinator / Case Manager Phone: [phone]

## 2022-12-07 ENCOUNTER — Inpatient Hospital Stay: Payer: Medicare Other

## 2022-12-07 ENCOUNTER — Other Ambulatory Visit: Payer: Self-pay

## 2022-12-07 ENCOUNTER — Other Ambulatory Visit: Payer: Self-pay | Admitting: *Deleted

## 2022-12-07 DIAGNOSIS — E079 Disorder of thyroid, unspecified: Secondary | ICD-10-CM | POA: Diagnosis not present

## 2022-12-07 DIAGNOSIS — C9200 Acute myeloblastic leukemia, not having achieved remission: Secondary | ICD-10-CM

## 2022-12-07 LAB — CBC WITH DIFFERENTIAL (CANCER CENTER ONLY)
Abs Immature Granulocytes: 0 10*3/uL (ref 0.00–0.07)
Basophils Absolute: 0 10*3/uL (ref 0.0–0.1)
Basophils Relative: 0 %
Eosinophils Absolute: 0 10*3/uL (ref 0.0–0.5)
Eosinophils Relative: 2 %
HCT: 25.1 % — ABNORMAL LOW (ref 36.0–46.0)
Hemoglobin: 8.8 g/dL — ABNORMAL LOW (ref 12.0–15.0)
Immature Granulocytes: 0 %
Lymphocytes Relative: 71 %
Lymphs Abs: 1.3 10*3/uL (ref 0.7–4.0)
MCH: 33.6 pg (ref 26.0–34.0)
MCHC: 35.1 g/dL (ref 30.0–36.0)
MCV: 95.8 fL (ref 80.0–100.0)
Monocytes Absolute: 0.1 10*3/uL (ref 0.1–1.0)
Monocytes Relative: 7 %
Neutro Abs: 0.4 10*3/uL — CL (ref 1.7–7.7)
Neutrophils Relative %: 20 %
Platelet Count: 15 10*3/uL — ABNORMAL LOW (ref 150–400)
RBC: 2.62 MIL/uL — ABNORMAL LOW (ref 3.87–5.11)
RDW: 15.4 % (ref 11.5–15.5)
Smear Review: DECREASED
WBC Count: 1.8 10*3/uL — ABNORMAL LOW (ref 4.0–10.5)
nRBC: 0 % (ref 0.0–0.2)

## 2022-12-07 LAB — CMP (CANCER CENTER ONLY)
ALT: 21 U/L (ref 0–44)
AST: 21 U/L (ref 15–41)
Albumin: 4 g/dL (ref 3.5–5.0)
Alkaline Phosphatase: 70 U/L (ref 38–126)
Anion gap: 6 (ref 5–15)
BUN: 35 mg/dL — ABNORMAL HIGH (ref 8–23)
CO2: 29 mmol/L (ref 22–32)
Calcium: 9.8 mg/dL (ref 8.9–10.3)
Chloride: 105 mmol/L (ref 98–111)
Creatinine: 1.05 mg/dL — ABNORMAL HIGH (ref 0.44–1.00)
GFR, Estimated: 54 mL/min — ABNORMAL LOW (ref >60)
Glucose, Bld: 129 mg/dL — ABNORMAL HIGH (ref 70–99)
Potassium: 4.1 mmol/L (ref 3.5–5.1)
Sodium: 140 mmol/L (ref 135–145)
Total Bilirubin: 0.5 mg/dL (ref 0.3–1.2)
Total Protein: 6.7 g/dL (ref 6.5–8.1)

## 2022-12-07 LAB — MAGNESIUM: Magnesium: 1.8 mg/dL (ref 1.7–2.4)

## 2022-12-07 LAB — PREPARE RBC (CROSSMATCH)

## 2022-12-07 LAB — SAMPLE TO BLOOD BANK

## 2022-12-07 MED ORDER — SODIUM CHLORIDE 0.9% IV SOLUTION
250.0000 mL | INTRAVENOUS | Status: DC
  Administered 2022-12-07: 100 mL via INTRAVENOUS

## 2022-12-07 MED ORDER — SODIUM CHLORIDE 0.9% FLUSH
10.0000 mL | Freq: Once | INTRAVENOUS | Status: AC
  Administered 2022-12-07: 10 mL

## 2022-12-07 MED ORDER — ACETAMINOPHEN 325 MG PO TABS
650.0000 mg | ORAL_TABLET | Freq: Once | ORAL | Status: AC
  Administered 2022-12-07: 650 mg via ORAL
  Filled 2022-12-07: qty 2

## 2022-12-07 MED ORDER — SODIUM CHLORIDE 0.9% FLUSH
10.0000 mL | INTRAVENOUS | Status: AC | PRN
  Administered 2022-12-07: 10 mL

## 2022-12-07 MED ORDER — HEPARIN SOD (PORK) LOCK FLUSH 100 UNIT/ML IV SOLN
500.0000 [IU] | Freq: Every day | INTRAVENOUS | Status: AC | PRN
  Administered 2022-12-07: 500 [IU]

## 2022-12-07 NOTE — Patient Instructions (Signed)

## 2022-12-08 LAB — TYPE AND SCREEN
ABO/RH(D): A POS
Antibody Screen: NEGATIVE
Unit division: 0

## 2022-12-08 LAB — BPAM RBC
Blood Product Expiration Date: 202411172359
ISSUE DATE / TIME: 202410311505
Unit Type and Rh: 6200

## 2022-12-08 LAB — PREPARE PLATELET PHERESIS: Unit division: 0

## 2022-12-08 LAB — BPAM PLATELET PHERESIS
Blood Product Expiration Date: 202410312359
ISSUE DATE / TIME: 202410311403
Unit Type and Rh: 600

## 2022-12-11 ENCOUNTER — Inpatient Hospital Stay: Payer: Medicare Other

## 2022-12-11 ENCOUNTER — Inpatient Hospital Stay: Payer: Medicare Other | Attending: Hematology and Oncology

## 2022-12-11 ENCOUNTER — Other Ambulatory Visit: Payer: Self-pay | Admitting: *Deleted

## 2022-12-11 DIAGNOSIS — C9200 Acute myeloblastic leukemia, not having achieved remission: Secondary | ICD-10-CM

## 2022-12-11 LAB — TYPE AND SCREEN
ABO/RH(D): A POS
Antibody Screen: NEGATIVE

## 2022-12-11 LAB — CBC WITH DIFFERENTIAL (CANCER CENTER ONLY)
Abs Immature Granulocytes: 0 10*3/uL (ref 0.00–0.07)
Basophils Absolute: 0 10*3/uL (ref 0.0–0.1)
Basophils Relative: 0 %
Eosinophils Absolute: 0 10*3/uL (ref 0.0–0.5)
Eosinophils Relative: 2 %
HCT: 29.1 % — ABNORMAL LOW (ref 36.0–46.0)
Hemoglobin: 9.8 g/dL — ABNORMAL LOW (ref 12.0–15.0)
Immature Granulocytes: 0 %
Lymphocytes Relative: 72 %
Lymphs Abs: 1.5 10*3/uL (ref 0.7–4.0)
MCH: 32.5 pg (ref 26.0–34.0)
MCHC: 33.7 g/dL (ref 30.0–36.0)
MCV: 96.4 fL (ref 80.0–100.0)
Monocytes Absolute: 0.2 10*3/uL (ref 0.1–1.0)
Monocytes Relative: 10 %
Neutro Abs: 0.3 10*3/uL — CL (ref 1.7–7.7)
Neutrophils Relative %: 16 %
Platelet Count: 15 10*3/uL — ABNORMAL LOW (ref 150–400)
RBC: 3.02 MIL/uL — ABNORMAL LOW (ref 3.87–5.11)
RDW: 15 % (ref 11.5–15.5)
Smear Review: NORMAL
WBC Count: 1.9 10*3/uL — ABNORMAL LOW (ref 4.0–10.5)
nRBC: 0 % (ref 0.0–0.2)

## 2022-12-11 LAB — CMP (CANCER CENTER ONLY)
ALT: 27 U/L (ref 0–44)
AST: 24 U/L (ref 15–41)
Albumin: 4.1 g/dL (ref 3.5–5.0)
Alkaline Phosphatase: 68 U/L (ref 38–126)
Anion gap: 5 (ref 5–15)
BUN: 43 mg/dL — ABNORMAL HIGH (ref 8–23)
CO2: 31 mmol/L (ref 22–32)
Calcium: 10.5 mg/dL — ABNORMAL HIGH (ref 8.9–10.3)
Chloride: 104 mmol/L (ref 98–111)
Creatinine: 0.96 mg/dL (ref 0.44–1.00)
GFR, Estimated: 60 mL/min — ABNORMAL LOW (ref >60)
Glucose, Bld: 104 mg/dL — ABNORMAL HIGH (ref 70–99)
Potassium: 4.5 mmol/L (ref 3.5–5.1)
Sodium: 140 mmol/L (ref 135–145)
Total Bilirubin: 0.6 mg/dL (ref <1.2)
Total Protein: 6.8 g/dL (ref 6.5–8.1)

## 2022-12-11 LAB — MAGNESIUM: Magnesium: 2.3 mg/dL (ref 1.7–2.4)

## 2022-12-11 LAB — SAMPLE TO BLOOD BANK

## 2022-12-11 MED ORDER — SODIUM CHLORIDE 0.9% FLUSH
10.0000 mL | Freq: Once | INTRAVENOUS | Status: AC
  Administered 2022-12-11: 10 mL

## 2022-12-11 MED ORDER — SODIUM CHLORIDE 0.9% FLUSH
10.0000 mL | INTRAVENOUS | Status: AC | PRN
  Administered 2022-12-11: 10 mL

## 2022-12-11 MED ORDER — ACETAMINOPHEN 325 MG PO TABS
650.0000 mg | ORAL_TABLET | Freq: Once | ORAL | Status: AC
  Administered 2022-12-11: 650 mg via ORAL
  Filled 2022-12-11: qty 2

## 2022-12-11 MED ORDER — HEPARIN SOD (PORK) LOCK FLUSH 100 UNIT/ML IV SOLN
500.0000 [IU] | Freq: Every day | INTRAVENOUS | Status: AC | PRN
  Administered 2022-12-11: 500 [IU]

## 2022-12-11 MED ORDER — SODIUM CHLORIDE 0.9% IV SOLUTION
250.0000 mL | INTRAVENOUS | Status: DC
Start: 2022-12-11 — End: 2022-12-11
  Administered 2022-12-11: 100 mL via INTRAVENOUS

## 2022-12-11 NOTE — Patient Instructions (Signed)
Platelet Transfusion A platelet transfusion is a procedure in which a person receives donated platelets through an IV. Platelets are parts of blood that stick together and form a clot to help the body stop bleeding after an injury. If you have too few platelets, your blood may have trouble clotting. This may cause you to bleed and bruise very easily. You may need a platelet transfusion if you have a condition that causes a low number of platelets (thrombocytopenia). A platelet transfusion may be used to stop or prevent excessive bleeding. Tell a health care provider about: Any reactions you have had during previous transfusions. Any allergies you have. All medicines you are taking, including vitamins, herbs, eye drops, creams, and over-the-counter medicines. Any bleeding problems you have. Any surgeries you have had. Any medical conditions you have. Whether you are pregnant or may be pregnant. What are the risks? Generally, this is a safe procedure. However, problems may occur, including: Fever. Infection. Allergic reaction to the donated (donor) platelets. Your body's disease-fighting system (immune system) attacking the donor platelets (hemolytic reaction). This is rare. A rare reaction that causes lung damage (transfusion-related acute lung injury). What happens before the procedure? Medicines Ask your health care provider about: Changing or stopping your regular medicines. This is especially important if you are taking diabetes medicines or blood thinners. Taking medicines such as aspirin and ibuprofen. These medicines can thin your blood. Do not take these medicines unless your health care provider tells you to take them. Taking over-the-counter medicines, vitamins, herbs, and supplements. General instructions You will have a blood test to determine your blood type. Your blood type determines what kind of platelets you will be given. Follow instructions from your health care provider  about eating or drinking restrictions. If you have had an allergic reaction to a transfusion in the past, you may be given medicine to help prevent a reaction. Your temperature, blood pressure, pulse, and breathing will be monitored. What happens during the procedure?  An IV will be inserted into one of your veins. For your safety, two health care providers will verify your identity along with the donor platelets about to be infused. A bag of donor platelets will be connected to your IV. The platelets will flow into your bloodstream. This usually takes 30-60 minutes. Your temperature, blood pressure, pulse, and breathing will be monitored during the transfusion. This helps detect early signs of any reaction. You will also be monitored for other symptoms that may indicate a reaction, including chills, hives, or itching. If you have signs of a reaction at any time, your transfusion will be stopped, and you may be given medicine to help manage the reaction. When your transfusion is complete, your IV will be removed. Pressure may be applied to the IV site for a few minutes to stop any bleeding. The IV site will be covered with a bandage (dressing). The procedure may vary among health care providers and hospitals. What can I expect after the procedure? Your blood pressure, temperature, pulse, and breathing will be monitored until you leave the hospital or clinic. You may have some bruising and soreness at your IV site. Follow these instructions at home: Medicines Take over-the-counter and prescription medicines only as told by your health care provider. Talk with your health care provider before you take any medicines that contain aspirin or NSAIDs, such as ibuprofen. These medicines increase your risk for dangerous bleeding. IV site care Check your IV site every day for signs of infection. Check for:   Redness, swelling, or pain. Fluid or blood. If fluid or blood drains from your IV site, use your  hands to press down firmly on a bandage covering the area for a minute or two. Doing this should stop the bleeding. Warmth. Pus or a bad smell. General instructions Change or remove your dressing as told by your health care provider. Return to your normal activities as told by your health care provider. Ask your health care provider what activities are safe for you. Do not take baths, swim, or use a hot tub until your health care provider approves. Ask your health care provider if you may take showers. Keep all follow-up visits. This is important. Contact a health care provider if: You have a headache that does not go away with medicine. You have hives, rash, or itchy skin. You have nausea or vomiting. You feel unusually tired or weak. You have signs of infection at your IV site. Get help right away if: You have a fever or chills. You urinate less often than usual. Your urine is darker colored than normal. You have any of the following: Trouble breathing. Pain in your back, abdomen, or chest. Cool, clammy skin. A fast heartbeat. Summary Platelets are tiny pieces of blood cells that clump together to form a blood clot when you have an injury. If you have too few platelets, your blood may have trouble clotting. A platelet transfusion is a procedure in which you receive donated platelets through an IV. A platelet transfusion may be used to stop or prevent excessive bleeding. After the procedure, check your IV site every day for signs of infection. This information is not intended to replace advice given to you by your health care provider. Make sure you discuss any questions you have with your health care provider. Document Revised: 07/29/2020 Document Reviewed: 07/29/2020 Elsevier Patient Education  2024 [employer]  

## 2022-12-13 ENCOUNTER — Telehealth: Payer: Self-pay | Admitting: Hematology and Oncology

## 2022-12-13 LAB — PREPARE PLATELET PHERESIS
Unit division: 0
Unit division: 0

## 2022-12-13 LAB — BPAM PLATELET PHERESIS
Blood Product Expiration Date: 202411052359
Blood Product Expiration Date: 202411062359
ISSUE DATE / TIME: 202411041458
Unit Type and Rh: 5100
Unit Type and Rh: 6200

## 2022-12-14 ENCOUNTER — Inpatient Hospital Stay: Payer: Medicare Other

## 2022-12-14 VITALS — BP 120/59 | HR 59 | Temp 98.6°F | Resp 15 | Ht 64.0 in | Wt 126.5 lb

## 2022-12-14 DIAGNOSIS — C9200 Acute myeloblastic leukemia, not having achieved remission: Secondary | ICD-10-CM

## 2022-12-14 LAB — CBC WITH DIFFERENTIAL (CANCER CENTER ONLY)
Abs Immature Granulocytes: 0 10*3/uL (ref 0.00–0.07)
Basophils Absolute: 0 10*3/uL (ref 0.0–0.1)
Basophils Relative: 1 %
Eosinophils Absolute: 0 10*3/uL (ref 0.0–0.5)
Eosinophils Relative: 2 %
HCT: 26.8 % — ABNORMAL LOW (ref 36.0–46.0)
Hemoglobin: 9.5 g/dL — ABNORMAL LOW (ref 12.0–15.0)
Immature Granulocytes: 0 %
Lymphocytes Relative: 65 %
Lymphs Abs: 1.4 10*3/uL (ref 0.7–4.0)
MCH: 33.8 pg (ref 26.0–34.0)
MCHC: 35.4 g/dL (ref 30.0–36.0)
MCV: 95.4 fL (ref 80.0–100.0)
Monocytes Absolute: 0.2 10*3/uL (ref 0.1–1.0)
Monocytes Relative: 9 %
Neutro Abs: 0.5 10*3/uL — ABNORMAL LOW (ref 1.7–7.7)
Neutrophils Relative %: 23 %
Platelet Count: 29 10*3/uL — ABNORMAL LOW (ref 150–400)
RBC: 2.81 MIL/uL — ABNORMAL LOW (ref 3.87–5.11)
RDW: 14.9 % (ref 11.5–15.5)
WBC Count: 2.1 10*3/uL — ABNORMAL LOW (ref 4.0–10.5)
nRBC: 0 % (ref 0.0–0.2)

## 2022-12-14 LAB — CMP (CANCER CENTER ONLY)
ALT: 24 U/L (ref 0–44)
AST: 23 U/L (ref 15–41)
Albumin: 4.1 g/dL (ref 3.5–5.0)
Alkaline Phosphatase: 66 U/L (ref 38–126)
Anion gap: 5 (ref 5–15)
BUN: 39 mg/dL — ABNORMAL HIGH (ref 8–23)
CO2: 31 mmol/L (ref 22–32)
Calcium: 9.8 mg/dL (ref 8.9–10.3)
Chloride: 103 mmol/L (ref 98–111)
Creatinine: 1.04 mg/dL — ABNORMAL HIGH (ref 0.44–1.00)
GFR, Estimated: 54 mL/min — ABNORMAL LOW (ref >60)
Glucose, Bld: 123 mg/dL — ABNORMAL HIGH (ref 70–99)
Potassium: 4.2 mmol/L (ref 3.5–5.1)
Sodium: 139 mmol/L (ref 135–145)
Total Bilirubin: 0.6 mg/dL (ref <1.2)
Total Protein: 6.8 g/dL (ref 6.5–8.1)

## 2022-12-14 LAB — MAGNESIUM: Magnesium: 2.1 mg/dL (ref 1.7–2.4)

## 2022-12-14 LAB — SAMPLE TO BLOOD BANK

## 2022-12-14 MED ORDER — SODIUM CHLORIDE 0.9% FLUSH
10.0000 mL | Freq: Once | INTRAVENOUS | Status: AC
  Administered 2022-12-14: 10 mL

## 2022-12-14 NOTE — Progress Notes (Signed)
Pt. states she had a tooth filling come out and needs a dental cleaning. Per Dr. Leonides Schanz, no dental procedures at this time due to low platelets. Instructed pt. and she states she understands. Will follow up at next scheduled appointment. Per parameters no blood products needed today. Pt. left via ambulation and no respiratory distress noted.

## 2022-12-18 ENCOUNTER — Inpatient Hospital Stay: Payer: Medicare Other

## 2022-12-18 ENCOUNTER — Telehealth: Payer: Self-pay | Admitting: Hematology and Oncology

## 2022-12-18 ENCOUNTER — Other Ambulatory Visit: Payer: Self-pay | Admitting: *Deleted

## 2022-12-18 ENCOUNTER — Telehealth: Payer: Self-pay | Admitting: *Deleted

## 2022-12-18 VITALS — BP 120/41 | HR 55 | Temp 97.8°F | Resp 16

## 2022-12-18 DIAGNOSIS — C9200 Acute myeloblastic leukemia, not having achieved remission: Secondary | ICD-10-CM

## 2022-12-18 LAB — CMP (CANCER CENTER ONLY)
ALT: 22 U/L (ref 0–44)
AST: 21 U/L (ref 15–41)
Albumin: 4.1 g/dL (ref 3.5–5.0)
Alkaline Phosphatase: 71 U/L (ref 38–126)
Anion gap: 6 (ref 5–15)
BUN: 32 mg/dL — ABNORMAL HIGH (ref 8–23)
CO2: 28 mmol/L (ref 22–32)
Calcium: 9.9 mg/dL (ref 8.9–10.3)
Chloride: 106 mmol/L (ref 98–111)
Creatinine: 1.03 mg/dL — ABNORMAL HIGH (ref 0.44–1.00)
GFR, Estimated: 55 mL/min — ABNORMAL LOW (ref >60)
Glucose, Bld: 122 mg/dL — ABNORMAL HIGH (ref 70–99)
Potassium: 4.7 mmol/L (ref 3.5–5.1)
Sodium: 140 mmol/L (ref 135–145)
Total Bilirubin: 0.6 mg/dL (ref <1.2)
Total Protein: 6.8 g/dL (ref 6.5–8.1)

## 2022-12-18 LAB — CBC WITH DIFFERENTIAL (CANCER CENTER ONLY)
Abs Immature Granulocytes: 0 10*3/uL (ref 0.00–0.07)
Basophils Absolute: 0 10*3/uL (ref 0.0–0.1)
Basophils Relative: 0 %
Eosinophils Absolute: 0 10*3/uL (ref 0.0–0.5)
Eosinophils Relative: 2 %
HCT: 27 % — ABNORMAL LOW (ref 36.0–46.0)
Hemoglobin: 9.4 g/dL — ABNORMAL LOW (ref 12.0–15.0)
Immature Granulocytes: 0 %
Lymphocytes Relative: 67 %
Lymphs Abs: 1.4 10*3/uL (ref 0.7–4.0)
MCH: 33.2 pg (ref 26.0–34.0)
MCHC: 34.8 g/dL (ref 30.0–36.0)
MCV: 95.4 fL (ref 80.0–100.0)
Monocytes Absolute: 0.2 10*3/uL (ref 0.1–1.0)
Monocytes Relative: 8 %
Neutro Abs: 0.5 10*3/uL — ABNORMAL LOW (ref 1.7–7.7)
Neutrophils Relative %: 23 %
Platelet Count: 9 10*3/uL — CL (ref 150–400)
RBC: 2.83 MIL/uL — ABNORMAL LOW (ref 3.87–5.11)
RDW: 14.9 % (ref 11.5–15.5)
WBC Count: 2.1 10*3/uL — ABNORMAL LOW (ref 4.0–10.5)
nRBC: 0 % (ref 0.0–0.2)

## 2022-12-18 LAB — SAMPLE TO BLOOD BANK

## 2022-12-18 LAB — MAGNESIUM: Magnesium: 2.1 mg/dL (ref 1.7–2.4)

## 2022-12-18 MED ORDER — ACETAMINOPHEN 325 MG PO TABS
650.0000 mg | ORAL_TABLET | Freq: Once | ORAL | Status: AC
  Administered 2022-12-18: 650 mg via ORAL
  Filled 2022-12-18: qty 2

## 2022-12-18 MED ORDER — SODIUM CHLORIDE 0.9% FLUSH
10.0000 mL | Freq: Once | INTRAVENOUS | Status: AC
  Administered 2022-12-18: 10 mL

## 2022-12-18 MED ORDER — SODIUM CHLORIDE 0.9% FLUSH: 10.0000 mL | INTRAVENOUS | Status: DC | PRN

## 2022-12-18 MED ORDER — HEPARIN SOD (PORK) LOCK FLUSH 100 UNIT/ML IV SOLN
500.0000 [IU] | Freq: Every day | INTRAVENOUS | Status: AC | PRN
  Administered 2022-12-18: 500 [IU]

## 2022-12-18 MED ORDER — SODIUM CHLORIDE 0.9% IV SOLUTION
250.0000 mL | INTRAVENOUS | Status: DC
  Administered 2022-12-18: 100 mL via INTRAVENOUS

## 2022-12-18 NOTE — Telephone Encounter (Signed)
CRITICAL VALUE STICKER  CRITICAL VALUE: Plt 9; Hgb 9  RECEIVER (on-site recipient of call): Henrene Dodge, RN  DATE & TIME NOTIFIED: 12/18/22; 0805  MESSENGER (representative from lab): Misty Stanley  MD NOTIFIED: Dr. Leonides Schanz  TIME OF NOTIFICATION:8:18 am  RESPONSE: Information acknowledged - scheduled appt for 1 unit PRBCs 12/18/22

## 2022-12-18 NOTE — Patient Instructions (Signed)
Platelet Transfusion A platelet transfusion is a procedure in which a person receives donated platelets through an IV. Platelets are parts of blood that stick together and form a clot to help the body stop bleeding after an injury. If you have too few platelets, your blood may have trouble clotting. This may cause you to bleed and bruise very easily. You may need a platelet transfusion if you have a condition that causes a low number of platelets (thrombocytopenia). A platelet transfusion may be used to stop or prevent excessive bleeding. Tell a health care provider about: Any reactions you have had during previous transfusions. Any allergies you have. All medicines you are taking, including vitamins, herbs, eye drops, creams, and over-the-counter medicines. Any bleeding problems you have. Any surgeries you have had. Any medical conditions you have. Whether you are pregnant or may be pregnant. What are the risks? Generally, this is a safe procedure. However, problems may occur, including: Fever. Infection. Allergic reaction to the donated (donor) platelets. Your body's disease-fighting system (immune system) attacking the donor platelets (hemolytic reaction). This is rare. A rare reaction that causes lung damage (transfusion-related acute lung injury). What happens before the procedure? Medicines Ask your health care provider about: Changing or stopping your regular medicines. This is especially important if you are taking diabetes medicines or blood thinners. Taking medicines such as aspirin and ibuprofen. These medicines can thin your blood. Do not take these medicines unless your health care provider tells you to take them. Taking over-the-counter medicines, vitamins, herbs, and supplements. General instructions You will have a blood test to determine your blood type. Your blood type determines what kind of platelets you will be given. Follow instructions from your health care provider  about eating or drinking restrictions. If you have had an allergic reaction to a transfusion in the past, you may be given medicine to help prevent a reaction. Your temperature, blood pressure, pulse, and breathing will be monitored. What happens during the procedure?  An IV will be inserted into one of your veins. For your safety, two health care providers will verify your identity along with the donor platelets about to be infused. A bag of donor platelets will be connected to your IV. The platelets will flow into your bloodstream. This usually takes 30-60 minutes. Your temperature, blood pressure, pulse, and breathing will be monitored during the transfusion. This helps detect early signs of any reaction. You will also be monitored for other symptoms that may indicate a reaction, including chills, hives, or itching. If you have signs of a reaction at any time, your transfusion will be stopped, and you may be given medicine to help manage the reaction. When your transfusion is complete, your IV will be removed. Pressure may be applied to the IV site for a few minutes to stop any bleeding. The IV site will be covered with a bandage (dressing). The procedure may vary among health care providers and hospitals. What can I expect after the procedure? Your blood pressure, temperature, pulse, and breathing will be monitored until you leave the hospital or clinic. You may have some bruising and soreness at your IV site. Follow these instructions at home: Medicines Take over-the-counter and prescription medicines only as told by your health care provider. Talk with your health care provider before you take any medicines that contain aspirin or NSAIDs, such as ibuprofen. These medicines increase your risk for dangerous bleeding. IV site care Check your IV site every day for signs of infection. Check for:   Redness, swelling, or pain. Fluid or blood. If fluid or blood drains from your IV site, use your  hands to press down firmly on a bandage covering the area for a minute or two. Doing this should stop the bleeding. Warmth. Pus or a bad smell. General instructions Change or remove your dressing as told by your health care provider. Return to your normal activities as told by your health care provider. Ask your health care provider what activities are safe for you. Do not take baths, swim, or use a hot tub until your health care provider approves. Ask your health care provider if you may take showers. Keep all follow-up visits. This is important. Contact a health care provider if: You have a headache that does not go away with medicine. You have hives, rash, or itchy skin. You have nausea or vomiting. You feel unusually tired or weak. You have signs of infection at your IV site. Get help right away if: You have a fever or chills. You urinate less often than usual. Your urine is darker colored than normal. You have any of the following: Trouble breathing. Pain in your back, abdomen, or chest. Cool, clammy skin. A fast heartbeat. Summary Platelets are tiny pieces of blood cells that clump together to form a blood clot when you have an injury. If you have too few platelets, your blood may have trouble clotting. A platelet transfusion is a procedure in which you receive donated platelets through an IV. A platelet transfusion may be used to stop or prevent excessive bleeding. After the procedure, check your IV site every day for signs of infection. This information is not intended to replace advice given to you by your health care provider. Make sure you discuss any questions you have with your health care provider. Document Revised: 07/29/2020 Document Reviewed: 07/29/2020 Elsevier Patient Education  2024 [employer]  

## 2022-12-19 LAB — PREPARE PLATELET PHERESIS: Unit division: 0

## 2022-12-19 LAB — BPAM PLATELET PHERESIS
Blood Product Expiration Date: 202411132359
ISSUE DATE / TIME: 202411110930
Unit Type and Rh: 5100

## 2022-12-20 ENCOUNTER — Telehealth: Payer: Self-pay | Admitting: Hematology and Oncology

## 2022-12-21 ENCOUNTER — Inpatient Hospital Stay: Payer: Medicare Other

## 2022-12-21 DIAGNOSIS — C9200 Acute myeloblastic leukemia, not having achieved remission: Secondary | ICD-10-CM | POA: Diagnosis not present

## 2022-12-23 ENCOUNTER — Other Ambulatory Visit: Payer: Self-pay | Admitting: Family Medicine

## 2022-12-25 ENCOUNTER — Inpatient Hospital Stay: Payer: Medicare Other

## 2022-12-25 ENCOUNTER — Other Ambulatory Visit: Payer: Self-pay | Admitting: *Deleted

## 2022-12-25 DIAGNOSIS — C9200 Acute myeloblastic leukemia, not having achieved remission: Secondary | ICD-10-CM

## 2022-12-25 LAB — CBC WITH DIFFERENTIAL (CANCER CENTER ONLY)
Abs Immature Granulocytes: 0 10*3/uL (ref 0.00–0.07)
Basophils Absolute: 0 10*3/uL (ref 0.0–0.1)
Basophils Relative: 0 %
Eosinophils Absolute: 0.1 10*3/uL (ref 0.0–0.5)
Eosinophils Relative: 2 %
HCT: 24 % — ABNORMAL LOW (ref 36.0–46.0)
Hemoglobin: 8.5 g/dL — ABNORMAL LOW (ref 12.0–15.0)
Immature Granulocytes: 0 %
Lymphocytes Relative: 66 %
Lymphs Abs: 1.6 10*3/uL (ref 0.7–4.0)
MCH: 33.7 pg (ref 26.0–34.0)
MCHC: 35.4 g/dL (ref 30.0–36.0)
MCV: 95.2 fL (ref 80.0–100.0)
Monocytes Absolute: 0.2 10*3/uL (ref 0.1–1.0)
Monocytes Relative: 9 %
Neutro Abs: 0.5 10*3/uL — ABNORMAL LOW (ref 1.7–7.7)
Neutrophils Relative %: 23 %
Platelet Count: 8 10*3/uL — CL (ref 150–400)
RBC: 2.52 MIL/uL — ABNORMAL LOW (ref 3.87–5.11)
RDW: 15.7 % — ABNORMAL HIGH (ref 11.5–15.5)
WBC Count: 2.3 10*3/uL — ABNORMAL LOW (ref 4.0–10.5)
nRBC: 0.9 % — ABNORMAL HIGH (ref 0.0–0.2)

## 2022-12-25 LAB — SAMPLE TO BLOOD BANK

## 2022-12-25 LAB — CMP (CANCER CENTER ONLY)
ALT: 19 U/L (ref 0–44)
AST: 19 U/L (ref 15–41)
Albumin: 4.1 g/dL (ref 3.5–5.0)
Alkaline Phosphatase: 65 U/L (ref 38–126)
Anion gap: 5 (ref 5–15)
BUN: 44 mg/dL — ABNORMAL HIGH (ref 8–23)
CO2: 29 mmol/L (ref 22–32)
Calcium: 9.9 mg/dL (ref 8.9–10.3)
Chloride: 105 mmol/L (ref 98–111)
Creatinine: 1.43 mg/dL — ABNORMAL HIGH (ref 0.44–1.00)
GFR, Estimated: 37 mL/min — ABNORMAL LOW (ref >60)
Glucose, Bld: 121 mg/dL — ABNORMAL HIGH (ref 70–99)
Potassium: 4.5 mmol/L (ref 3.5–5.1)
Sodium: 139 mmol/L (ref 135–145)
Total Bilirubin: 0.6 mg/dL (ref <1.2)
Total Protein: 6.7 g/dL (ref 6.5–8.1)

## 2022-12-25 LAB — PREPARE RBC (CROSSMATCH)

## 2022-12-25 LAB — MAGNESIUM: Magnesium: 2 mg/dL (ref 1.7–2.4)

## 2022-12-25 MED ORDER — SODIUM CHLORIDE 0.9% FLUSH
10.0000 mL | Freq: Once | INTRAVENOUS | Status: AC
  Administered 2022-12-25: 10 mL

## 2022-12-25 MED ORDER — HEPARIN SOD (PORK) LOCK FLUSH 100 UNIT/ML IV SOLN
500.0000 [IU] | Freq: Every day | INTRAVENOUS | Status: AC | PRN
  Administered 2022-12-25: 500 [IU]

## 2022-12-25 MED ORDER — SODIUM CHLORIDE 0.9% FLUSH
10.0000 mL | INTRAVENOUS | Status: AC | PRN
  Administered 2022-12-25: 10 mL

## 2022-12-25 MED ORDER — SODIUM CHLORIDE 0.9% IV SOLUTION
250.0000 mL | INTRAVENOUS | Status: DC
  Administered 2022-12-25: 100 mL via INTRAVENOUS

## 2022-12-25 MED ORDER — ACETAMINOPHEN 325 MG PO TABS
650.0000 mg | ORAL_TABLET | Freq: Once | ORAL | Status: AC
  Administered 2022-12-25: 650 mg via ORAL
  Filled 2022-12-25: qty 2

## 2022-12-25 NOTE — Patient Instructions (Signed)

## 2022-12-26 LAB — BPAM PLATELET PHERESIS
Blood Product Expiration Date: 202411202359
ISSUE DATE / TIME: 202411181446
Unit Type and Rh: 6200

## 2022-12-26 LAB — BPAM RBC
Blood Product Expiration Date: 202411292359
ISSUE DATE / TIME: 202411181444
Unit Type and Rh: 6200

## 2022-12-26 LAB — PREPARE PLATELET PHERESIS: Unit division: 0

## 2022-12-26 LAB — TYPE AND SCREEN
ABO/RH(D): A POS
Antibody Screen: NEGATIVE
Unit division: 0

## 2022-12-28 ENCOUNTER — Inpatient Hospital Stay: Payer: Medicare Other

## 2022-12-28 DIAGNOSIS — C9200 Acute myeloblastic leukemia, not having achieved remission: Secondary | ICD-10-CM

## 2022-12-28 LAB — CMP (CANCER CENTER ONLY)
ALT: 17 U/L (ref 0–44)
AST: 19 U/L (ref 15–41)
Albumin: 4.1 g/dL (ref 3.5–5.0)
Alkaline Phosphatase: 70 U/L (ref 38–126)
Anion gap: 6 (ref 5–15)
BUN: 41 mg/dL — ABNORMAL HIGH (ref 8–23)
CO2: 28 mmol/L (ref 22–32)
Calcium: 10 mg/dL (ref 8.9–10.3)
Chloride: 104 mmol/L (ref 98–111)
Creatinine: 0.97 mg/dL (ref 0.44–1.00)
GFR, Estimated: 59 mL/min — ABNORMAL LOW (ref >60)
Glucose, Bld: 101 mg/dL — ABNORMAL HIGH (ref 70–99)
Potassium: 4.4 mmol/L (ref 3.5–5.1)
Sodium: 138 mmol/L (ref 135–145)
Total Bilirubin: 0.7 mg/dL (ref <1.2)
Total Protein: 6.7 g/dL (ref 6.5–8.1)

## 2022-12-28 LAB — CBC WITH DIFFERENTIAL (CANCER CENTER ONLY)
Abs Immature Granulocytes: 0 10*3/uL (ref 0.00–0.07)
Basophils Absolute: 0 10*3/uL (ref 0.0–0.1)
Basophils Relative: 0 %
Eosinophils Absolute: 0 10*3/uL (ref 0.0–0.5)
Eosinophils Relative: 2 %
HCT: 28.1 % — ABNORMAL LOW (ref 36.0–46.0)
Hemoglobin: 10.1 g/dL — ABNORMAL LOW (ref 12.0–15.0)
Immature Granulocytes: 0 %
Lymphocytes Relative: 69 %
Lymphs Abs: 1.5 10*3/uL (ref 0.7–4.0)
MCH: 34 pg (ref 26.0–34.0)
MCHC: 35.9 g/dL (ref 30.0–36.0)
MCV: 94.6 fL (ref 80.0–100.0)
Monocytes Absolute: 0.2 10*3/uL (ref 0.1–1.0)
Monocytes Relative: 8 %
Neutro Abs: 0.5 10*3/uL — ABNORMAL LOW (ref 1.7–7.7)
Neutrophils Relative %: 21 %
Platelet Count: 21 10*3/uL — ABNORMAL LOW (ref 150–400)
RBC: 2.97 MIL/uL — ABNORMAL LOW (ref 3.87–5.11)
RDW: 15.4 % (ref 11.5–15.5)
WBC Count: 2.2 10*3/uL — ABNORMAL LOW (ref 4.0–10.5)
nRBC: 0.9 % — ABNORMAL HIGH (ref 0.0–0.2)

## 2022-12-28 LAB — MAGNESIUM: Magnesium: 2 mg/dL (ref 1.7–2.4)

## 2022-12-28 LAB — SAMPLE TO BLOOD BANK

## 2022-12-28 MED ORDER — HEPARIN SOD (PORK) LOCK FLUSH 100 UNIT/ML IV SOLN
500.0000 [IU] | Freq: Once | INTRAVENOUS | Status: AC
  Administered 2022-12-28: 500 [IU] via INTRAVENOUS

## 2022-12-28 MED ORDER — SODIUM CHLORIDE 0.9% FLUSH
10.0000 mL | Freq: Once | INTRAVENOUS | Status: AC
  Administered 2022-12-28: 10 mL

## 2022-12-28 MED ORDER — SODIUM CHLORIDE 0.9% FLUSH
10.0000 mL | Freq: Once | INTRAVENOUS | Status: AC
  Administered 2022-12-28: 10 mL via INTRAVENOUS

## 2022-12-28 NOTE — Progress Notes (Signed)
Per patient parameters: 1 unit pheresed platelets for platelets of<20k; HGB <8 transfuse with 2 units PRBC's; HGB <9 transfuse with 1 unit PRBCs. All blood products to be irradiated, pt does not meet requirements for blood today.  Patient de accessed, given copy of upcoming appointments, and discharged to lobby, ambulatory with no complaints.

## 2022-12-31 NOTE — Progress Notes (Unsigned)
Patient Care Team: [name], MD as PCP - General (Family Medicine) Otho Ket, RN as [facility] Care Management  Clinic Day:  01/02/2023  Referring physician: [name], MD  ASSESSMENT & PLAN:   Assessment & Plan: Acute myeloid leukemia not having achieved remission [facility]) -- Patient currently follows with Dr. Lowella Bandy at [facility].  Her AML therapy is currently on hold.  She will be seeing Dr. Lowella Bandy a next week to reevaluate -- We will provide supportive care as requested with labs on Mondays and Thursdays with transfusion as necessary -- Labs today show white blood cell count 1.5, hemoglobin 9.5, MCV 98.2, and platelets of 9.  Will receive a platelet transfusion today. -- Recommend twice weekly lab checks with a return clinic visit in 8 weeks time -she has required frequent transfusions of platelets and blood.      Plan: Labs reviewed  -CBC showing WBC 2.4; Hgb 9.6; Hct 27.4; Plt 9; Anc 0.6 -CMP - K 4.5; glucose 108; BUN 39; Creatinine 1.07; eGFR 53; Ca 10.4; LFTs normal.   -Magnesium 2.1 Patient followed in [facility] for supportive care.  She will get 1 unit of platelets today.  Will be seen again on Friday for additional labs.  Blood, iron, and/or platelets will be given as needed. She continues management of AML with [facility] oncologist..  Not currently on chemotherapy as she was developing mild heart failure while taking chemotherapy.  She does have upcoming appointment with her oncologist for further evaluation and possible retrial of chemotherapy. Labs and treatment as scheduled.  She knows to contact clinic for development of worrisome symptoms such as shortness of breath, weakness, excess fatigue, or headaches which are unexplained. Follow-up as scheduled.  The patient understands the plans discussed today and is in agreement with them.  She knows to contact our office if she develops concerns prior to her next appointment.  I  provided 25 minutes of face-to-face time during this encounter and > 50% was spent counseling as documented under my assessment and plan.    Carlean Jews, NP  [facility] - A DEPT [facility] [address] [place] [address] Dept: [phone] Dept Fax: [phone]   No orders of the defined types were placed in this encounter.     CHIEF COMPLAINT:  CC: acute myeloid leukemia   Current Treatment:  supportive therapy with platelet and blood trransfusions  INTERVAL HISTORY:  Velvie is here today for repeat clinical assessment. Last saw Dr. Leonides Schanz on 11/16/2022. Has required multiple transfusions of pheresed platelets and blood. Continues to be followed by Dr. Lowella Bandy in [facility].  Today, her platelet count is very low at 9.  She will receive 1 unit of pheresis platelets today.  She has upcoming appointment with Dr. Lowella Bandy for further evaluation.  Currently not taking chemotherapy as it was causing problems with her heart.  She denies fevers or chills. She denies pain. Her appetite is fair. Her weight has increased 2 pounds over last month .  I have reviewed the past medical history, past surgical history, social history and family history with the patient and they are unchanged from previous note.  ALLERGIES:  is allergic to ambien [zolpidem], codeine, dust mite extract, grass pollen(k-o-r-t-swt vern), hydrocodone, statins, sulfa antibiotics, and zofran [ondansetron].  MEDICATIONS:  Current Outpatient Medications  Medication Sig Dispense Refill   acyclovir (ZOVIRAX) 400 MG tablet Take 400  mg by mouth 2 (two) times daily.     ALPRAZolam (XANAX) 0.25 MG tablet TAKE 1 TABLET (0.25 MG TOTAL) BY MOUTH DAILY AS NEEDED. 30 tablet 3   Cyanocobalamin (VITAMIN B-12 PO) Take 1,000 mcg by mouth daily.     furosemide (LASIX) 20 MG tablet Take 20 mg by mouth daily. Prn for weight gain >5 lbs     granisetron (KYTRIL) 1 MG tablet  Take by mouth.     levofloxacin (LEVAQUIN) 250 MG tablet Take 250 mg by mouth daily.     levothyroxine (SYNTHROID) 50 MCG tablet TAKE 1 TABLET (50 MCG TOTAL) BY MOUTH DAILY. 90 tablet 3   lidocaine-prilocaine (EMLA) cream Apply 1 Application topically as needed. 30 g 0   losartan (COZAAR) 50 MG tablet Take 50 mg by mouth daily.     metoprolol succinate (TOPROL-XL) 25 MG 24 hr tablet Take 1 tablet by mouth daily.     polycarbophil (FIBERCON) 625 MG tablet Take 625 mg by mouth daily.     posaconazole (NOXAFIL) 100 MG TBEC delayed-release tablet Take 300 mg by mouth daily.     prochlorperazine (COMPAZINE) 5 MG tablet Take by mouth.     Red Yeast Rice 600 MG TABS Take 1 tablet (600 mg total) by mouth in the morning and at bedtime.     sertraline (ZOLOFT) 25 MG tablet Take 1 tablet (25 mg total) by mouth daily.     spironolactone (ALDACTONE) 25 MG tablet Take by mouth.     zinc sulfate 220 (50 Zn) MG capsule Take 220 mg by mouth daily.     cefpodoxime (VANTIN) 200 MG tablet Take 200 mg by mouth 2 (two) times daily. (Patient not taking: Reported on 12/25/2022)     No current facility-administered medications for this visit.      REVIEW OF SYSTEMS:   Constitutional: Denies fevers, chills or abnormal weight loss. fatigue Eyes: Denies blurriness of vision Ears, nose, mouth, throat, and face: Denies mucositis or sore throat Respiratory: Denies cough, dyspnea or wheezes Cardiovascular: Denies palpitation, chest discomfort or lower extremity swelling. Does have some SOB with exertion. Gastrointestinal:  Denies nausea, heartburn or change in bowel habits Skin: Denies abnormal skin rashes Lymphatics: Denies new lymphadenopathy or easy bruising Neurological:Denies numbness, tingling or new weaknesses Behavioral/Psych: Mood is stable, no new changes  All other systems were reviewed with the patient and are negative.   VITALS:   Today's Vitals   01/01/23 1247  BP: (!) 124/44  Pulse: 63  Resp:  17  Temp: 98.3 F (36.8 C)  TempSrc: Temporal  SpO2: 100%  Weight: 127 lb 4.8 oz (57.7 kg)   Body mass index is 21.85 kg/m.    Wt Readings from Last 3 Encounters:  01/01/23 127 lb 4.8 oz (57.7 kg)  12/14/22 126 lb 8 oz (57.4 kg)  11/27/22 123 lb 14.4 oz (56.2 kg)    Body mass index is 21.85 kg/m.  Performance status (ECOG): 1 - Symptomatic but completely ambulatory  PHYSICAL EXAM:   GENERAL:alert, no distress and comfortable SKIN: skin color, texture, turgor are normal, no rashes or significant lesions EYES: normal, Conjunctiva are pink and non-injected, sclera clear OROPHARYNX:no exudate, no erythema and lips, buccal mucosa, and tongue normal  NECK: supple, thyroid normal size, non-tender, without nodularity LYMPH:  no palpable lymphadenopathy in the cervical, axillary or inguinal LUNGS: clear to auscultation and percussion with normal breathing effort HEART: regular rate & rhythm and no murmurs and no lower extremity edema ABDOMEN:abdomen soft, non-tender  and normal bowel sounds Musculoskeletal:no cyanosis of digits and no clubbing  NEURO: alert & oriented x 3 with fluent speech, no focal motor/sensory deficits  LABORATORY DATA:  I have reviewed the data as listed    Component Value Date/Time   NA 136 01/01/2023 1212   K 4.5 01/01/2023 1212   CL 102 01/01/2023 1212   CO2 28 01/01/2023 1212   GLUCOSE 108 (H) 01/01/2023 1212   BUN 39 (H) 01/01/2023 1212   CREATININE 1.07 (H) 01/01/2023 1212   CALCIUM 10.4 (H) 01/01/2023 1212   PROT 7.0 01/01/2023 1212   ALBUMIN 4.2 01/01/2023 1212   AST 21 01/01/2023 1212   ALT 16 01/01/2023 1212   ALKPHOS 70 01/01/2023 1212   BILITOT 0.8 01/01/2023 1212   GFRNONAA 53 (L) 01/01/2023 1212     Lab Results  Component Value Date   WBC 2.4 (L) 01/01/2023   NEUTROABS 0.6 (L) 01/01/2023   HGB 9.6 (L) 01/01/2023   HCT 27.4 (L) 01/01/2023   MCV 94.5 01/01/2023   PLT 9 (LL) 01/01/2023

## 2022-12-31 NOTE — Assessment & Plan Note (Addendum)
--   Patient currently follows with Dr. Lowella Bandy at [facility]  Her AML therapy is currently on hold.  She will be seeing Dr. Lowella Bandy a next week to reevaluate -- We will provide supportive care as requested with labs on Mondays and Thursdays with transfusion as necessary -- Labs today show white blood cell count 1.5, hemoglobin 9.5, MCV 98.2, and platelets of 9.  Will receive a platelet transfusion today. -- Recommend twice weekly lab checks with a return clinic visit in 8 weeks time -she has required frequent transfusions of platelets and blood.

## 2023-01-01 ENCOUNTER — Inpatient Hospital Stay: Payer: Medicare Other

## 2023-01-01 ENCOUNTER — Other Ambulatory Visit: Payer: Self-pay | Admitting: *Deleted

## 2023-01-01 ENCOUNTER — Inpatient Hospital Stay: Payer: Medicare Other | Admitting: Nurse Practitioner

## 2023-01-01 ENCOUNTER — Encounter: Payer: Self-pay | Admitting: Nurse Practitioner

## 2023-01-01 VITALS — BP 124/44 | HR 63 | Temp 98.3°F | Resp 17 | Wt 127.3 lb

## 2023-01-01 DIAGNOSIS — C9200 Acute myeloblastic leukemia, not having achieved remission: Secondary | ICD-10-CM

## 2023-01-01 LAB — CBC WITH DIFFERENTIAL (CANCER CENTER ONLY)
Abs Immature Granulocytes: 0 10*3/uL (ref 0.00–0.07)
Basophils Absolute: 0 10*3/uL (ref 0.0–0.1)
Basophils Relative: 0 %
Eosinophils Absolute: 0 10*3/uL (ref 0.0–0.5)
Eosinophils Relative: 1 %
HCT: 27.4 % — ABNORMAL LOW (ref 36.0–46.0)
Hemoglobin: 9.6 g/dL — ABNORMAL LOW (ref 12.0–15.0)
Immature Granulocytes: 0 %
Lymphocytes Relative: 65 %
Lymphs Abs: 1.5 10*3/uL (ref 0.7–4.0)
MCH: 33.1 pg (ref 26.0–34.0)
MCHC: 35 g/dL (ref 30.0–36.0)
MCV: 94.5 fL (ref 80.0–100.0)
Monocytes Absolute: 0.2 10*3/uL (ref 0.1–1.0)
Monocytes Relative: 8 %
Neutro Abs: 0.6 10*3/uL — ABNORMAL LOW (ref 1.7–7.7)
Neutrophils Relative %: 26 %
Platelet Count: 9 10*3/uL — CL (ref 150–400)
RBC: 2.9 MIL/uL — ABNORMAL LOW (ref 3.87–5.11)
RDW: 15.6 % — ABNORMAL HIGH (ref 11.5–15.5)
WBC Count: 2.4 10*3/uL — ABNORMAL LOW (ref 4.0–10.5)
nRBC: 0 % (ref 0.0–0.2)

## 2023-01-01 LAB — CMP (CANCER CENTER ONLY)
ALT: 16 U/L (ref 0–44)
AST: 21 U/L (ref 15–41)
Albumin: 4.2 g/dL (ref 3.5–5.0)
Alkaline Phosphatase: 70 U/L (ref 38–126)
Anion gap: 6 (ref 5–15)
BUN: 39 mg/dL — ABNORMAL HIGH (ref 8–23)
CO2: 28 mmol/L (ref 22–32)
Calcium: 10.4 mg/dL — ABNORMAL HIGH (ref 8.9–10.3)
Chloride: 102 mmol/L (ref 98–111)
Creatinine: 1.07 mg/dL — ABNORMAL HIGH (ref 0.44–1.00)
GFR, Estimated: 53 mL/min — ABNORMAL LOW (ref >60)
Glucose, Bld: 108 mg/dL — ABNORMAL HIGH (ref 70–99)
Potassium: 4.5 mmol/L (ref 3.5–5.1)
Sodium: 136 mmol/L (ref 135–145)
Total Bilirubin: 0.8 mg/dL (ref <1.2)
Total Protein: 7 g/dL (ref 6.5–8.1)

## 2023-01-01 LAB — SAMPLE TO BLOOD BANK

## 2023-01-01 LAB — MAGNESIUM: Magnesium: 2.1 mg/dL (ref 1.7–2.4)

## 2023-01-01 MED ORDER — SODIUM CHLORIDE 0.9% FLUSH
10.0000 mL | INTRAVENOUS | Status: AC | PRN
Start: 2023-01-01 — End: 2023-01-01
  Administered 2023-01-01: 10 mL

## 2023-01-01 MED ORDER — ACETAMINOPHEN 325 MG PO TABS
650.0000 mg | ORAL_TABLET | Freq: Once | ORAL | Status: AC
  Administered 2023-01-01: 650 mg via ORAL
  Filled 2023-01-01: qty 2

## 2023-01-01 MED ORDER — HEPARIN SOD (PORK) LOCK FLUSH 100 UNIT/ML IV SOLN
500.0000 [IU] | Freq: Every day | INTRAVENOUS | Status: AC | PRN
Start: 2023-01-01 — End: 2023-01-01
  Administered 2023-01-01: 500 [IU]

## 2023-01-01 MED ORDER — SODIUM CHLORIDE 0.9% IV SOLUTION
250.0000 mL | INTRAVENOUS | Status: DC
  Administered 2023-01-01: 100 mL via INTRAVENOUS

## 2023-01-01 MED ORDER — SODIUM CHLORIDE 0.9% FLUSH
10.0000 mL | Freq: Once | INTRAVENOUS | Status: AC
  Administered 2023-01-01: 10 mL

## 2023-01-01 NOTE — Patient Instructions (Signed)
Blood Transfusion, Adult A blood transfusion is a procedure in which you receive blood or a type of blood cell (blood component) through an IV. You may need a blood transfusion when you have a low blood count, which is a low number of any blood cell. This may result from a bleeding disorder, illness, injury, or surgery. The blood may come from a donor, or you may be able to have your own blood collected and stored (autologous blood donation) before a planned surgery. The blood given in a transfusion may be made up of different blood components. You may receive: Red blood cells. These carry oxygen to the cells in the body. Platelets. These help your blood to clot. Plasma. This is the liquid part of your blood. It carries proteins and other substances throughout the body. White blood cells. These help you fight infections. If you have hemophilia or another clotting disorder, you may also receive other types of blood products. Depending on the type of blood product, this procedure may take 1-4 hours to complete. Tell a health care provider about: Any bleeding problems you have. Any previous reactions you have had during a blood transfusion. Any allergies you have. All medicines you are taking, including vitamins, herbs, eye drops, creams, and over-the-counter medicines. Any surgeries you have had. Any medical conditions you have. Whether you are pregnant or may be pregnant. What are the risks? Talk with your health care provider about risks. The most common problems include: A mild allergic reaction, such as red, swollen areas of skin (hives) and itching. Fever or chills. This may be the body's response to new blood cells received. This may occur during or up to 4 hours after the transfusion. More serious problems may include: A serious allergic reaction that causes difficulty breathing or swelling around the face and lips. Transfusion-associated circulatory overload (TACO), or too much fluid in  the lungs. This may cause breathing problems. Transfusion-related acute lung injury (TRALI), which causes breathing difficulty and low oxygen in the blood. This can occur within hours of the transfusion or several days later. Iron overload. This can happen after receiving many blood transfusions over a period of time. Infection or virus being transmitted. This is rare because donated blood is carefully tested before it is given. Hemolytic transfusion reaction. This is rare. It happens when the body's defense system (immune system)tries to attack the new blood cells. Symptoms may include fever, chills, nausea, low blood pressure, and low back or chest pain. Transfusion-associated graft-versus-host disease (TAGVHD). This is rare. It happens when donated cells attack the body's healthy tissues. What happens before the procedure? You will have a blood test to check your blood type. This test is done to know what kind of blood your body will accept and to match it to the donor blood. If you are going to have a planned surgery, you may be able to do an autologous blood donation. This may be done in case you need to have a transfusion. You will have your temperature, blood pressure, and pulse checked before the transfusion. If you have had an allergic reaction to a transfusion in the past, you may be given medicine to help prevent a reaction. This medicine may be given to you by mouth (orally) or through an IV. What happens during the procedure?  An IV will be inserted into one of your veins. The bag of blood will be attached to your IV. The blood will then enter through your vein. Your temperature, blood pressure,  and pulse will be monitored during the transfusion. This monitoring is done to detect early signs of a transfusion reaction. Tell your nurse right away if you have any of these symptoms during the transfusion: Shortness of breath or trouble breathing. Chest or back pain. Fever or  chills. Itching or hives. If you have any signs or symptoms of a reaction, your transfusion will be stopped and you may be given medicine. When the transfusion is complete, your IV will be removed. Pressure may be applied to the IV site for a few minutes. A bandage (dressing)will be applied. The procedure may vary among health care providers and hospitals. What happens after the procedure? Your temperature, blood pressure, pulse, breathing rate, and blood oxygen level will be monitored until you leave the hospital or clinic. Your blood may be tested to see how you have responded to the transfusion. You may be warmed with fluids or blankets to maintain a normal body temperature. If you receive your blood transfusion in an outpatient setting, you will be told whom to contact to report any reactions. Where to find more information Visit the [employer]: redcross.org Summary A blood transfusion is a procedure in which you receive blood or a type of blood cell (blood component) through an IV. The blood given in a transfusion may be made up of different blood components. You may receive red blood cells, platelets, plasma, or white blood cells depending on the condition treated. Your temperature, blood pressure, and pulse will be monitored before, during, and after the transfusion. After the transfusion, your blood may be tested to see how your body has responded. This information is not intended to replace advice given to you by your health care provider. Make sure you discuss any questions you have with your health care provider. Document Revised: 04/22/2021 Document Reviewed: 04/22/2021 Elsevier Patient Education  2024 [employer].

## 2023-01-02 ENCOUNTER — Encounter: Payer: Self-pay | Admitting: Hematology and Oncology

## 2023-01-02 LAB — BPAM PLATELET PHERESIS
Blood Product Expiration Date: 202411282359
ISSUE DATE / TIME: 202411251412
Unit Type and Rh: 6200

## 2023-01-02 LAB — PREPARE PLATELET PHERESIS: Unit division: 0

## 2023-01-03 ENCOUNTER — Other Ambulatory Visit: Payer: Self-pay

## 2023-01-03 DIAGNOSIS — C9200 Acute myeloblastic leukemia, not having achieved remission: Secondary | ICD-10-CM

## 2023-01-05 ENCOUNTER — Inpatient Hospital Stay: Payer: Medicare Other

## 2023-01-05 ENCOUNTER — Other Ambulatory Visit: Payer: Self-pay | Admitting: *Deleted

## 2023-01-05 ENCOUNTER — Other Ambulatory Visit: Payer: Self-pay | Admitting: Nurse Practitioner

## 2023-01-05 DIAGNOSIS — C9200 Acute myeloblastic leukemia, not having achieved remission: Secondary | ICD-10-CM

## 2023-01-05 LAB — CBC WITH DIFFERENTIAL (CANCER CENTER ONLY)
Abs Immature Granulocytes: 0.01 10*3/uL (ref 0.00–0.07)
Basophils Absolute: 0 10*3/uL (ref 0.0–0.1)
Basophils Relative: 0 %
Eosinophils Absolute: 0.1 10*3/uL (ref 0.0–0.5)
Eosinophils Relative: 2 %
HCT: 26.7 % — ABNORMAL LOW (ref 36.0–46.0)
Hemoglobin: 9 g/dL — ABNORMAL LOW (ref 12.0–15.0)
Immature Granulocytes: 0 %
Lymphocytes Relative: 58 %
Lymphs Abs: 1.4 10*3/uL (ref 0.7–4.0)
MCH: 32.6 pg (ref 26.0–34.0)
MCHC: 33.7 g/dL (ref 30.0–36.0)
MCV: 96.7 fL (ref 80.0–100.0)
Monocytes Absolute: 0.2 10*3/uL (ref 0.1–1.0)
Monocytes Relative: 10 %
Neutro Abs: 0.7 10*3/uL — ABNORMAL LOW (ref 1.7–7.7)
Neutrophils Relative %: 30 %
Platelet Count: 19 10*3/uL — ABNORMAL LOW (ref 150–400)
RBC: 2.76 MIL/uL — ABNORMAL LOW (ref 3.87–5.11)
RDW: 16.2 % — ABNORMAL HIGH (ref 11.5–15.5)
WBC Count: 2.4 10*3/uL — ABNORMAL LOW (ref 4.0–10.5)
nRBC: 0 % (ref 0.0–0.2)

## 2023-01-05 LAB — TYPE AND SCREEN
ABO/RH(D): A POS
Antibody Screen: NEGATIVE

## 2023-01-05 LAB — SAMPLE TO BLOOD BANK

## 2023-01-05 LAB — CMP (CANCER CENTER ONLY)
ALT: 232 U/L — ABNORMAL HIGH (ref 0–44)
AST: 93 U/L — ABNORMAL HIGH (ref 15–41)
Albumin: 4 g/dL (ref 3.5–5.0)
Alkaline Phosphatase: 108 U/L (ref 38–126)
Anion gap: 6 (ref 5–15)
BUN: 39 mg/dL — ABNORMAL HIGH (ref 8–23)
CO2: 26 mmol/L (ref 22–32)
Calcium: 9.9 mg/dL (ref 8.9–10.3)
Chloride: 106 mmol/L (ref 98–111)
Creatinine: 1.04 mg/dL — ABNORMAL HIGH (ref 0.44–1.00)
GFR, Estimated: 54 mL/min — ABNORMAL LOW (ref >60)
Glucose, Bld: 110 mg/dL — ABNORMAL HIGH (ref 70–99)
Potassium: 4.3 mmol/L (ref 3.5–5.1)
Sodium: 138 mmol/L (ref 135–145)
Total Bilirubin: 0.8 mg/dL (ref <1.2)
Total Protein: 6.6 g/dL (ref 6.5–8.1)

## 2023-01-05 MED ORDER — HEPARIN SOD (PORK) LOCK FLUSH 100 UNIT/ML IV SOLN
250.0000 [IU] | INTRAVENOUS | Status: AC | PRN
  Administered 2023-01-05: 250 [IU]

## 2023-01-05 MED ORDER — SODIUM CHLORIDE 0.9% IV SOLUTION: 250.0000 mL | INTRAVENOUS | Status: DC

## 2023-01-05 MED ORDER — ACETAMINOPHEN 325 MG PO TABS
650.0000 mg | ORAL_TABLET | Freq: Once | ORAL | Status: AC
  Administered 2023-01-05: 650 mg via ORAL
  Filled 2023-01-05: qty 2

## 2023-01-05 MED ORDER — SODIUM CHLORIDE 0.9% IV SOLUTION
250.0000 mL | INTRAVENOUS | Status: DC
  Administered 2023-01-05: 100 mL via INTRAVENOUS

## 2023-01-05 MED ORDER — SODIUM CHLORIDE 0.9% FLUSH
10.0000 mL | Freq: Once | INTRAVENOUS | Status: AC
Start: 2023-01-05 — End: 2023-01-05
  Administered 2023-01-05: 10 mL

## 2023-01-08 ENCOUNTER — Other Ambulatory Visit: Payer: Self-pay | Admitting: *Deleted

## 2023-01-08 ENCOUNTER — Inpatient Hospital Stay: Payer: Medicare Other | Attending: Hematology and Oncology

## 2023-01-08 ENCOUNTER — Inpatient Hospital Stay: Payer: Medicare Other

## 2023-01-08 DIAGNOSIS — C9200 Acute myeloblastic leukemia, not having achieved remission: Secondary | ICD-10-CM

## 2023-01-08 LAB — CMP (CANCER CENTER ONLY)
ALT: 92 U/L — ABNORMAL HIGH (ref 0–44)
AST: 27 U/L (ref 15–41)
Albumin: 4.2 g/dL (ref 3.5–5.0)
Alkaline Phosphatase: 92 U/L (ref 38–126)
Anion gap: 7 (ref 5–15)
BUN: 41 mg/dL — ABNORMAL HIGH (ref 8–23)
CO2: 27 mmol/L (ref 22–32)
Calcium: 10.2 mg/dL (ref 8.9–10.3)
Chloride: 105 mmol/L (ref 98–111)
Creatinine: 1.07 mg/dL — ABNORMAL HIGH (ref 0.44–1.00)
GFR, Estimated: 53 mL/min — ABNORMAL LOW (ref >60)
Glucose, Bld: 113 mg/dL — ABNORMAL HIGH (ref 70–99)
Potassium: 4.4 mmol/L (ref 3.5–5.1)
Sodium: 139 mmol/L (ref 135–145)
Total Bilirubin: 0.7 mg/dL (ref <1.2)
Total Protein: 6.7 g/dL (ref 6.5–8.1)

## 2023-01-08 LAB — CBC WITH DIFFERENTIAL (CANCER CENTER ONLY)
Abs Immature Granulocytes: 0.01 10*3/uL (ref 0.00–0.07)
Basophils Absolute: 0 10*3/uL (ref 0.0–0.1)
Basophils Relative: 0 %
Eosinophils Absolute: 0 10*3/uL (ref 0.0–0.5)
Eosinophils Relative: 2 %
HCT: 25.8 % — ABNORMAL LOW (ref 36.0–46.0)
Hemoglobin: 8.8 g/dL — ABNORMAL LOW (ref 12.0–15.0)
Immature Granulocytes: 1 %
Lymphocytes Relative: 59 %
Lymphs Abs: 1.3 10*3/uL (ref 0.7–4.0)
MCH: 33.5 pg (ref 26.0–34.0)
MCHC: 34.1 g/dL (ref 30.0–36.0)
MCV: 98.1 fL (ref 80.0–100.0)
Monocytes Absolute: 0.2 10*3/uL (ref 0.1–1.0)
Monocytes Relative: 10 %
Neutro Abs: 0.6 10*3/uL — ABNORMAL LOW (ref 1.7–7.7)
Neutrophils Relative %: 28 %
Platelet Count: 41 10*3/uL — ABNORMAL LOW (ref 150–400)
RBC: 2.63 MIL/uL — ABNORMAL LOW (ref 3.87–5.11)
RDW: 16.8 % — ABNORMAL HIGH (ref 11.5–15.5)
WBC Count: 2.2 10*3/uL — ABNORMAL LOW (ref 4.0–10.5)
nRBC: 0 % (ref 0.0–0.2)

## 2023-01-08 LAB — PREPARE PLATELET PHERESIS: Unit division: 0

## 2023-01-08 LAB — MAGNESIUM: Magnesium: 2 mg/dL (ref 1.7–2.4)

## 2023-01-08 LAB — BPAM PLATELET PHERESIS
Blood Product Expiration Date: 202411302359
ISSUE DATE / TIME: 202411290911
Unit Type and Rh: 600

## 2023-01-08 LAB — PREPARE RBC (CROSSMATCH)

## 2023-01-08 LAB — SAMPLE TO BLOOD BANK

## 2023-01-08 MED ORDER — SODIUM CHLORIDE 0.9% FLUSH
10.0000 mL | Freq: Once | INTRAVENOUS | Status: AC
  Administered 2023-01-08: 10 mL

## 2023-01-08 MED ORDER — ACETAMINOPHEN 325 MG PO TABS
650.0000 mg | ORAL_TABLET | Freq: Once | ORAL | Status: AC
  Administered 2023-01-08: 650 mg via ORAL

## 2023-01-08 MED ORDER — SODIUM CHLORIDE 0.9% IV SOLUTION
250.0000 mL | INTRAVENOUS | Status: DC
  Administered 2023-01-08: 100 mL via INTRAVENOUS

## 2023-01-09 ENCOUNTER — Telehealth: Payer: Self-pay | Admitting: Family Medicine

## 2023-01-09 DIAGNOSIS — I509 Heart failure, unspecified: Secondary | ICD-10-CM

## 2023-01-09 LAB — TYPE AND SCREEN
ABO/RH(D): A POS
Antibody Screen: NEGATIVE
Unit division: 0

## 2023-01-09 LAB — BPAM RBC
Blood Product Expiration Date: 202412182359
ISSUE DATE / TIME: 202412021012
Unit Type and Rh: 6200

## 2023-01-09 NOTE — Telephone Encounter (Signed)
Pt will go with whatever provider you recommend. I mentioned to the patient that Dr. Wyline Mood and Dr. Cristal Deer have an oncology sub speciality.

## 2023-01-09 NOTE — Telephone Encounter (Signed)
Patient was scheduled yesterday for an echo at [facility]. Appt cancelled due to technician availability. Pt prefers going to [facility] and would like a referral to a [place] location. Please reply via MyChart.

## 2023-01-10 NOTE — Telephone Encounter (Signed)
I need clarification.    Does she want to get the echo done locally but still see [facility]?  If so, they should be able to set that up.   If she wants to transfer all of her cardiac care locally, then I can put in the referral.    Please let me know.  Thanks.

## 2023-01-10 NOTE — Telephone Encounter (Signed)
Done. Thanks.

## 2023-01-10 NOTE — Telephone Encounter (Signed)
Spoke with patient and she is okay with referral to see Dr. Jodelle Red for her heartcare needs.

## 2023-01-10 NOTE — Addendum Note (Signed)
Addended by: [name] on: 01/10/2023 05:12 PM   Modules accepted: Orders

## 2023-01-11 ENCOUNTER — Inpatient Hospital Stay: Payer: Medicare Other

## 2023-01-11 ENCOUNTER — Other Ambulatory Visit: Payer: Self-pay

## 2023-01-11 DIAGNOSIS — C9200 Acute myeloblastic leukemia, not having achieved remission: Secondary | ICD-10-CM

## 2023-01-11 LAB — CBC WITH DIFFERENTIAL (CANCER CENTER ONLY)
Abs Immature Granulocytes: 0 10*3/uL (ref 0.00–0.07)
Basophils Absolute: 0 10*3/uL (ref 0.0–0.1)
Basophils Relative: 0 %
Eosinophils Absolute: 0.1 10*3/uL (ref 0.0–0.5)
Eosinophils Relative: 2 %
HCT: 29.7 % — ABNORMAL LOW (ref 36.0–46.0)
Hemoglobin: 10.1 g/dL — ABNORMAL LOW (ref 12.0–15.0)
Immature Granulocytes: 0 %
Lymphocytes Relative: 57 %
Lymphs Abs: 1.3 10*3/uL (ref 0.7–4.0)
MCH: 32.5 pg (ref 26.0–34.0)
MCHC: 34 g/dL (ref 30.0–36.0)
MCV: 95.5 fL (ref 80.0–100.0)
Monocytes Absolute: 0.2 10*3/uL (ref 0.1–1.0)
Monocytes Relative: 9 %
Neutro Abs: 0.7 10*3/uL — ABNORMAL LOW (ref 1.7–7.7)
Neutrophils Relative %: 32 %
Platelet Count: 18 10*3/uL — ABNORMAL LOW (ref 150–400)
RBC: 3.11 MIL/uL — ABNORMAL LOW (ref 3.87–5.11)
RDW: 16.8 % — ABNORMAL HIGH (ref 11.5–15.5)
WBC Count: 2.3 10*3/uL — ABNORMAL LOW (ref 4.0–10.5)
nRBC: 0 % (ref 0.0–0.2)

## 2023-01-11 LAB — TYPE AND SCREEN
ABO/RH(D): A POS
Antibody Screen: NEGATIVE

## 2023-01-11 LAB — CMP (CANCER CENTER ONLY)
ALT: 46 U/L — ABNORMAL HIGH (ref 0–44)
AST: 19 U/L (ref 15–41)
Albumin: 4.1 g/dL (ref 3.5–5.0)
Alkaline Phosphatase: 86 U/L (ref 38–126)
Anion gap: 6 (ref 5–15)
BUN: 36 mg/dL — ABNORMAL HIGH (ref 8–23)
CO2: 29 mmol/L (ref 22–32)
Calcium: 10.2 mg/dL (ref 8.9–10.3)
Chloride: 105 mmol/L (ref 98–111)
Creatinine: 1.03 mg/dL — ABNORMAL HIGH (ref 0.44–1.00)
GFR, Estimated: 55 mL/min — ABNORMAL LOW (ref >60)
Glucose, Bld: 107 mg/dL — ABNORMAL HIGH (ref 70–99)
Potassium: 4.5 mmol/L (ref 3.5–5.1)
Sodium: 140 mmol/L (ref 135–145)
Total Bilirubin: 0.9 mg/dL (ref <1.2)
Total Protein: 6.7 g/dL (ref 6.5–8.1)

## 2023-01-11 LAB — SAMPLE TO BLOOD BANK

## 2023-01-11 LAB — MAGNESIUM: Magnesium: 2 mg/dL (ref 1.7–2.4)

## 2023-01-11 MED ORDER — SODIUM CHLORIDE 0.9% FLUSH
10.0000 mL | INTRAVENOUS | Status: AC | PRN
Start: 2023-01-11 — End: 2023-01-11
  Administered 2023-01-11: 10 mL

## 2023-01-11 MED ORDER — ACETAMINOPHEN 325 MG PO TABS
650.0000 mg | ORAL_TABLET | Freq: Once | ORAL | Status: AC
  Administered 2023-01-11: 650 mg via ORAL
  Filled 2023-01-11: qty 2

## 2023-01-11 MED ORDER — HEPARIN SOD (PORK) LOCK FLUSH 100 UNIT/ML IV SOLN
500.0000 [IU] | Freq: Every day | INTRAVENOUS | Status: AC | PRN
  Administered 2023-01-11: 500 [IU]

## 2023-01-11 MED ORDER — SODIUM CHLORIDE 0.9% IV SOLUTION
250.0000 mL | INTRAVENOUS | Status: DC
  Administered 2023-01-11: 100 mL via INTRAVENOUS

## 2023-01-11 NOTE — Patient Instructions (Signed)
Platelet Transfusion A platelet transfusion is a procedure in which a person receives donated platelets through an IV. Platelets are parts of blood that stick together and form a clot to help the body stop bleeding after an injury. If you have too few platelets, your blood may have trouble clotting. This may cause you to bleed and bruise very easily. You may need a platelet transfusion if you have a condition that causes a low number of platelets (thrombocytopenia). A platelet transfusion may be used to stop or prevent excessive bleeding. Tell a health care provider about: Any reactions you have had during previous transfusions. Any allergies you have. All medicines you are taking, including vitamins, herbs, eye drops, creams, and over-the-counter medicines. Any bleeding problems you have. Any surgeries you have had. Any medical conditions you have. Whether you are pregnant or may be pregnant. What are the risks? Generally, this is a safe procedure. However, problems may occur, including: Fever. Infection. Allergic reaction to the donated (donor) platelets. Your body's disease-fighting system (immune system) attacking the donor platelets (hemolytic reaction). This is rare. A rare reaction that causes lung damage (transfusion-related acute lung injury). What happens before the procedure? Medicines Ask your health care provider about: Changing or stopping your regular medicines. This is especially important if you are taking diabetes medicines or blood thinners. Taking medicines such as aspirin and ibuprofen. These medicines can thin your blood. Do not take these medicines unless your health care provider tells you to take them. Taking over-the-counter medicines, vitamins, herbs, and supplements. General instructions You will have a blood test to determine your blood type. Your blood type determines what kind of platelets you will be given. Follow instructions from your health care provider  about eating or drinking restrictions. If you have had an allergic reaction to a transfusion in the past, you may be given medicine to help prevent a reaction. Your temperature, blood pressure, pulse, and breathing will be monitored. What happens during the procedure?  An IV will be inserted into one of your veins. For your safety, two health care providers will verify your identity along with the donor platelets about to be infused. A bag of donor platelets will be connected to your IV. The platelets will flow into your bloodstream. This usually takes 30-60 minutes. Your temperature, blood pressure, pulse, and breathing will be monitored during the transfusion. This helps detect early signs of any reaction. You will also be monitored for other symptoms that may indicate a reaction, including chills, hives, or itching. If you have signs of a reaction at any time, your transfusion will be stopped, and you may be given medicine to help manage the reaction. When your transfusion is complete, your IV will be removed. Pressure may be applied to the IV site for a few minutes to stop any bleeding. The IV site will be covered with a bandage (dressing). The procedure may vary among health care providers and hospitals. What can I expect after the procedure? Your blood pressure, temperature, pulse, and breathing will be monitored until you leave the hospital or clinic. You may have some bruising and soreness at your IV site. Follow these instructions at home: Medicines Take over-the-counter and prescription medicines only as told by your health care provider. Talk with your health care provider before you take any medicines that contain aspirin or NSAIDs, such as ibuprofen. These medicines increase your risk for dangerous bleeding. IV site care Check your IV site every day for signs of infection. Check for:   Redness, swelling, or pain. Fluid or blood. If fluid or blood drains from your IV site, use your  hands to press down firmly on a bandage covering the area for a minute or two. Doing this should stop the bleeding. Warmth. Pus or a bad smell. General instructions Change or remove your dressing as told by your health care provider. Return to your normal activities as told by your health care provider. Ask your health care provider what activities are safe for you. Do not take baths, swim, or use a hot tub until your health care provider approves. Ask your health care provider if you may take showers. Keep all follow-up visits. This is important. Contact a health care provider if: You have a headache that does not go away with medicine. You have hives, rash, or itchy skin. You have nausea or vomiting. You feel unusually tired or weak. You have signs of infection at your IV site. Get help right away if: You have a fever or chills. You urinate less often than usual. Your urine is darker colored than normal. You have any of the following: Trouble breathing. Pain in your back, abdomen, or chest. Cool, clammy skin. A fast heartbeat. Summary Platelets are tiny pieces of blood cells that clump together to form a blood clot when you have an injury. If you have too few platelets, your blood may have trouble clotting. A platelet transfusion is a procedure in which you receive donated platelets through an IV. A platelet transfusion may be used to stop or prevent excessive bleeding. After the procedure, check your IV site every day for signs of infection. This information is not intended to replace advice given to you by your health care provider. Make sure you discuss any questions you have with your health care provider. Document Revised: 07/29/2020 Document Reviewed: 07/29/2020 Elsevier Patient Education  2024 [employer]  

## 2023-01-12 LAB — BPAM PLATELET PHERESIS
Blood Product Expiration Date: 202412072359
ISSUE DATE / TIME: 202412050850
Unit Type and Rh: 6200

## 2023-01-12 LAB — PREPARE PLATELET PHERESIS: Unit division: 0

## 2023-01-15 ENCOUNTER — Inpatient Hospital Stay: Payer: Medicare Other

## 2023-01-15 ENCOUNTER — Other Ambulatory Visit: Payer: Self-pay | Admitting: *Deleted

## 2023-01-15 DIAGNOSIS — C9200 Acute myeloblastic leukemia, not having achieved remission: Secondary | ICD-10-CM

## 2023-01-15 LAB — CBC WITH DIFFERENTIAL (CANCER CENTER ONLY)
Abs Immature Granulocytes: 0.01 10*3/uL (ref 0.00–0.07)
Basophils Absolute: 0 10*3/uL (ref 0.0–0.1)
Basophils Relative: 0 %
Eosinophils Absolute: 0 10*3/uL (ref 0.0–0.5)
Eosinophils Relative: 2 %
HCT: 27.8 % — ABNORMAL LOW (ref 36.0–46.0)
Hemoglobin: 9.5 g/dL — ABNORMAL LOW (ref 12.0–15.0)
Immature Granulocytes: 0 %
Lymphocytes Relative: 54 %
Lymphs Abs: 1.4 10*3/uL (ref 0.7–4.0)
MCH: 32.9 pg (ref 26.0–34.0)
MCHC: 34.2 g/dL (ref 30.0–36.0)
MCV: 96.2 fL (ref 80.0–100.0)
Monocytes Absolute: 0.2 10*3/uL (ref 0.1–1.0)
Monocytes Relative: 9 %
Neutro Abs: 0.9 10*3/uL — ABNORMAL LOW (ref 1.7–7.7)
Neutrophils Relative %: 35 %
Platelet Count: 16 10*3/uL — ABNORMAL LOW (ref 150–400)
RBC: 2.89 MIL/uL — ABNORMAL LOW (ref 3.87–5.11)
RDW: 17.2 % — ABNORMAL HIGH (ref 11.5–15.5)
WBC Count: 2.7 10*3/uL — ABNORMAL LOW (ref 4.0–10.5)
nRBC: 0 % (ref 0.0–0.2)

## 2023-01-15 LAB — CMP (CANCER CENTER ONLY)
ALT: 24 U/L (ref 0–44)
AST: 18 U/L (ref 15–41)
Albumin: 4.1 g/dL (ref 3.5–5.0)
Alkaline Phosphatase: 69 U/L (ref 38–126)
Anion gap: 8 (ref 5–15)
BUN: 56 mg/dL — ABNORMAL HIGH (ref 8–23)
CO2: 27 mmol/L (ref 22–32)
Calcium: 10.1 mg/dL (ref 8.9–10.3)
Chloride: 103 mmol/L (ref 98–111)
Creatinine: 1.15 mg/dL — ABNORMAL HIGH (ref 0.44–1.00)
GFR, Estimated: 48 mL/min — ABNORMAL LOW (ref >60)
Glucose, Bld: 119 mg/dL — ABNORMAL HIGH (ref 70–99)
Potassium: 4.8 mmol/L (ref 3.5–5.1)
Sodium: 138 mmol/L (ref 135–145)
Total Bilirubin: 0.8 mg/dL (ref <1.2)
Total Protein: 6.7 g/dL (ref 6.5–8.1)

## 2023-01-15 LAB — SAMPLE TO BLOOD BANK

## 2023-01-15 LAB — MAGNESIUM: Magnesium: 2.1 mg/dL (ref 1.7–2.4)

## 2023-01-15 MED ORDER — ACETAMINOPHEN 325 MG PO TABS
650.0000 mg | ORAL_TABLET | Freq: Once | ORAL | Status: AC
  Administered 2023-01-15: 650 mg via ORAL
  Filled 2023-01-15: qty 2

## 2023-01-15 MED ORDER — SODIUM CHLORIDE 0.9% IV SOLUTION
250.0000 mL | INTRAVENOUS | Status: DC
  Administered 2023-01-15: 100 mL via INTRAVENOUS

## 2023-01-15 MED ORDER — HEPARIN SOD (PORK) LOCK FLUSH 100 UNIT/ML IV SOLN
500.0000 [IU] | Freq: Every day | INTRAVENOUS | Status: AC | PRN
  Administered 2023-01-15: 500 [IU]

## 2023-01-15 MED ORDER — SODIUM CHLORIDE 0.9% FLUSH
10.0000 mL | INTRAVENOUS | Status: AC | PRN
  Administered 2023-01-15: 10 mL

## 2023-01-15 MED ORDER — SODIUM CHLORIDE 0.9% FLUSH
10.0000 mL | Freq: Once | INTRAVENOUS | Status: AC
  Administered 2023-01-15: 10 mL

## 2023-01-15 NOTE — Patient Instructions (Signed)
Platelet Transfusion A platelet transfusion is a procedure in which a person receives donated platelets through an IV. Platelets are parts of blood that stick together and form a clot to help the body stop bleeding after an injury. If you have too few platelets, your blood may have trouble clotting. This may cause you to bleed and bruise very easily. You may need a platelet transfusion if you have a condition that causes a low number of platelets (thrombocytopenia). A platelet transfusion may be used to stop or prevent excessive bleeding. Tell a health care provider about: Any reactions you have had during previous transfusions. Any allergies you have. All medicines you are taking, including vitamins, herbs, eye drops, creams, and over-the-counter medicines. Any bleeding problems you have. Any surgeries you have had. Any medical conditions you have. Whether you are pregnant or may be pregnant. What are the risks? Generally, this is a safe procedure. However, problems may occur, including: Fever. Infection. Allergic reaction to the donated (donor) platelets. Your body's disease-fighting system (immune system) attacking the donor platelets (hemolytic reaction). This is rare. A rare reaction that causes lung damage (transfusion-related acute lung injury). What happens before the procedure? Medicines Ask your health care provider about: Changing or stopping your regular medicines. This is especially important if you are taking diabetes medicines or blood thinners. Taking medicines such as aspirin and ibuprofen. These medicines can thin your blood. Do not take these medicines unless your health care provider tells you to take them. Taking over-the-counter medicines, vitamins, herbs, and supplements. General instructions You will have a blood test to determine your blood type. Your blood type determines what kind of platelets you will be given. Follow instructions from your health care provider  about eating or drinking restrictions. If you have had an allergic reaction to a transfusion in the past, you may be given medicine to help prevent a reaction. Your temperature, blood pressure, pulse, and breathing will be monitored. What happens during the procedure?  An IV will be inserted into one of your veins. For your safety, two health care providers will verify your identity along with the donor platelets about to be infused. A bag of donor platelets will be connected to your IV. The platelets will flow into your bloodstream. This usually takes 30-60 minutes. Your temperature, blood pressure, pulse, and breathing will be monitored during the transfusion. This helps detect early signs of any reaction. You will also be monitored for other symptoms that may indicate a reaction, including chills, hives, or itching. If you have signs of a reaction at any time, your transfusion will be stopped, and you may be given medicine to help manage the reaction. When your transfusion is complete, your IV will be removed. Pressure may be applied to the IV site for a few minutes to stop any bleeding. The IV site will be covered with a bandage (dressing). The procedure may vary among health care providers and hospitals. What can I expect after the procedure? Your blood pressure, temperature, pulse, and breathing will be monitored until you leave the hospital or clinic. You may have some bruising and soreness at your IV site. Follow these instructions at home: Medicines Take over-the-counter and prescription medicines only as told by your health care provider. Talk with your health care provider before you take any medicines that contain aspirin or NSAIDs, such as ibuprofen. These medicines increase your risk for dangerous bleeding. IV site care Check your IV site every day for signs of infection. Check for:   Redness, swelling, or pain. Fluid or blood. If fluid or blood drains from your IV site, use your  hands to press down firmly on a bandage covering the area for a minute or two. Doing this should stop the bleeding. Warmth. Pus or a bad smell. General instructions Change or remove your dressing as told by your health care provider. Return to your normal activities as told by your health care provider. Ask your health care provider what activities are safe for you. Do not take baths, swim, or use a hot tub until your health care provider approves. Ask your health care provider if you may take showers. Keep all follow-up visits. This is important. Contact a health care provider if: You have a headache that does not go away with medicine. You have hives, rash, or itchy skin. You have nausea or vomiting. You feel unusually tired or weak. You have signs of infection at your IV site. Get help right away if: You have a fever or chills. You urinate less often than usual. Your urine is darker colored than normal. You have any of the following: Trouble breathing. Pain in your back, abdomen, or chest. Cool, clammy skin. A fast heartbeat. Summary Platelets are tiny pieces of blood cells that clump together to form a blood clot when you have an injury. If you have too few platelets, your blood may have trouble clotting. A platelet transfusion is a procedure in which you receive donated platelets through an IV. A platelet transfusion may be used to stop or prevent excessive bleeding. After the procedure, check your IV site every day for signs of infection. This information is not intended to replace advice given to you by your health care provider. Make sure you discuss any questions you have with your health care provider. Document Revised: 07/29/2020 Document Reviewed: 07/29/2020 Elsevier Patient Education  2024 [employer]  

## 2023-01-16 LAB — PREPARE PLATELET PHERESIS: Unit division: 0

## 2023-01-16 LAB — BPAM PLATELET PHERESIS
Blood Product Expiration Date: 202412102359
ISSUE DATE / TIME: 202412090900
Unit Type and Rh: 7300

## 2023-01-18 ENCOUNTER — Inpatient Hospital Stay: Payer: Medicare Other

## 2023-01-18 DIAGNOSIS — C9200 Acute myeloblastic leukemia, not having achieved remission: Secondary | ICD-10-CM | POA: Diagnosis not present

## 2023-01-22 ENCOUNTER — Encounter: Payer: Self-pay | Admitting: Hematology and Oncology

## 2023-01-22 ENCOUNTER — Other Ambulatory Visit: Payer: Self-pay | Admitting: *Deleted

## 2023-01-22 ENCOUNTER — Inpatient Hospital Stay: Payer: Medicare Other

## 2023-01-22 ENCOUNTER — Inpatient Hospital Stay (HOSPITAL_BASED_OUTPATIENT_CLINIC_OR_DEPARTMENT_OTHER): Payer: Medicare Other | Admitting: Hematology and Oncology

## 2023-01-22 ENCOUNTER — Other Ambulatory Visit (HOSPITAL_COMMUNITY): Payer: Self-pay

## 2023-01-22 ENCOUNTER — Telehealth: Payer: Self-pay | Admitting: *Deleted

## 2023-01-22 VITALS — BP 131/53 | HR 74 | Temp 98.2°F | Resp 14 | Wt 124.6 lb

## 2023-01-22 DIAGNOSIS — C9200 Acute myeloblastic leukemia, not having achieved remission: Secondary | ICD-10-CM

## 2023-01-22 DIAGNOSIS — Z95828 Presence of other vascular implants and grafts: Secondary | ICD-10-CM

## 2023-01-22 LAB — CBC WITH DIFFERENTIAL (CANCER CENTER ONLY)
Abs Immature Granulocytes: 0.01 10*3/uL (ref 0.00–0.07)
Basophils Absolute: 0 10*3/uL (ref 0.0–0.1)
Basophils Relative: 0 %
Eosinophils Absolute: 0 10*3/uL (ref 0.0–0.5)
Eosinophils Relative: 1 %
HCT: 25.4 % — ABNORMAL LOW (ref 36.0–46.0)
Hemoglobin: 8.5 g/dL — ABNORMAL LOW (ref 12.0–15.0)
Immature Granulocytes: 1 %
Lymphocytes Relative: 53 %
Lymphs Abs: 1 10*3/uL (ref 0.7–4.0)
MCH: 32.8 pg (ref 26.0–34.0)
MCHC: 33.5 g/dL (ref 30.0–36.0)
MCV: 98.1 fL (ref 80.0–100.0)
Monocytes Absolute: 0.2 10*3/uL (ref 0.1–1.0)
Monocytes Relative: 9 %
Neutro Abs: 0.7 10*3/uL — ABNORMAL LOW (ref 1.7–7.7)
Neutrophils Relative %: 36 %
Platelet Count: 16 10*3/uL — ABNORMAL LOW (ref 150–400)
RBC: 2.59 MIL/uL — ABNORMAL LOW (ref 3.87–5.11)
RDW: 18.6 % — ABNORMAL HIGH (ref 11.5–15.5)
WBC Count: 1.9 10*3/uL — ABNORMAL LOW (ref 4.0–10.5)
nRBC: 0 % (ref 0.0–0.2)

## 2023-01-22 LAB — CMP (CANCER CENTER ONLY)
ALT: 16 U/L (ref 0–44)
AST: 19 U/L (ref 15–41)
Albumin: 4.2 g/dL (ref 3.5–5.0)
Alkaline Phosphatase: 67 U/L (ref 38–126)
Anion gap: 6 (ref 5–15)
BUN: 39 mg/dL — ABNORMAL HIGH (ref 8–23)
CO2: 27 mmol/L (ref 22–32)
Calcium: 10 mg/dL (ref 8.9–10.3)
Chloride: 105 mmol/L (ref 98–111)
Creatinine: 1.05 mg/dL — ABNORMAL HIGH (ref 0.44–1.00)
GFR, Estimated: 54 mL/min — ABNORMAL LOW (ref >60)
Glucose, Bld: 109 mg/dL — ABNORMAL HIGH (ref 70–99)
Potassium: 4.3 mmol/L (ref 3.5–5.1)
Sodium: 138 mmol/L (ref 135–145)
Total Bilirubin: 0.8 mg/dL (ref <1.2)
Total Protein: 6.5 g/dL (ref 6.5–8.1)

## 2023-01-22 LAB — SAMPLE TO BLOOD BANK

## 2023-01-22 LAB — MAGNESIUM: Magnesium: 1.9 mg/dL (ref 1.7–2.4)

## 2023-01-22 MED ORDER — TRANEXAMIC ACID 650 MG PO TABS
650.0000 mg | ORAL_TABLET | ORAL | 0 refills | Status: DC
  Filled 2023-01-22: qty 30, 30d supply, fill #0

## 2023-01-22 MED ORDER — SODIUM CHLORIDE 0.9% FLUSH
10.0000 mL | Freq: Once | INTRAVENOUS | Status: AC
  Administered 2023-01-22: 10 mL

## 2023-01-22 MED ORDER — ACETAMINOPHEN 325 MG PO TABS
650.0000 mg | ORAL_TABLET | Freq: Once | ORAL | Status: AC
  Administered 2023-01-22: 650 mg via ORAL
  Filled 2023-01-22: qty 2

## 2023-01-22 MED ORDER — SODIUM CHLORIDE 0.9% IV SOLUTION
250.0000 mL | INTRAVENOUS | Status: DC
  Administered 2023-01-22: 100 mL via INTRAVENOUS

## 2023-01-22 NOTE — Patient Instructions (Signed)
Platelet Transfusion A platelet transfusion is a procedure in which a person receives donated platelets through an IV. Platelets are parts of blood that stick together and form a clot to help the body stop bleeding after an injury. If you have too few platelets, your blood may have trouble clotting. This may cause you to bleed and bruise very easily. You may need a platelet transfusion if you have a condition that causes a low number of platelets (thrombocytopenia). A platelet transfusion may be used to stop or prevent excessive bleeding. Tell a health care provider about: Any reactions you have had during previous transfusions. Any allergies you have. All medicines you are taking, including vitamins, herbs, eye drops, creams, and over-the-counter medicines. Any bleeding problems you have. Any surgeries you have had. Any medical conditions you have. Whether you are pregnant or may be pregnant. What are the risks? Generally, this is a safe procedure. However, problems may occur, including: Fever. Infection. Allergic reaction to the donated (donor) platelets. Your body's disease-fighting system (immune system) attacking the donor platelets (hemolytic reaction). This is rare. A rare reaction that causes lung damage (transfusion-related acute lung injury). What happens before the procedure? Medicines Ask your health care provider about: Changing or stopping your regular medicines. This is especially important if you are taking diabetes medicines or blood thinners. Taking medicines such as aspirin and ibuprofen. These medicines can thin your blood. Do not take these medicines unless your health care provider tells you to take them. Taking over-the-counter medicines, vitamins, herbs, and supplements. General instructions You will have a blood test to determine your blood type. Your blood type determines what kind of platelets you will be given. Follow instructions from your health care provider  about eating or drinking restrictions. If you have had an allergic reaction to a transfusion in the past, you may be given medicine to help prevent a reaction. Your temperature, blood pressure, pulse, and breathing will be monitored. What happens during the procedure?  An IV will be inserted into one of your veins. For your safety, two health care providers will verify your identity along with the donor platelets about to be infused. A bag of donor platelets will be connected to your IV. The platelets will flow into your bloodstream. This usually takes 30-60 minutes. Your temperature, blood pressure, pulse, and breathing will be monitored during the transfusion. This helps detect early signs of any reaction. You will also be monitored for other symptoms that may indicate a reaction, including chills, hives, or itching. If you have signs of a reaction at any time, your transfusion will be stopped, and you may be given medicine to help manage the reaction. When your transfusion is complete, your IV will be removed. Pressure may be applied to the IV site for a few minutes to stop any bleeding. The IV site will be covered with a bandage (dressing). The procedure may vary among health care providers and hospitals. What can I expect after the procedure? Your blood pressure, temperature, pulse, and breathing will be monitored until you leave the hospital or clinic. You may have some bruising and soreness at your IV site. Follow these instructions at home: Medicines Take over-the-counter and prescription medicines only as told by your health care provider. Talk with your health care provider before you take any medicines that contain aspirin or NSAIDs, such as ibuprofen. These medicines increase your risk for dangerous bleeding. IV site care Check your IV site every day for signs of infection. Check for:   Redness, swelling, or pain. Fluid or blood. If fluid or blood drains from your IV site, use your  hands to press down firmly on a bandage covering the area for a minute or two. Doing this should stop the bleeding. Warmth. Pus or a bad smell. General instructions Change or remove your dressing as told by your health care provider. Return to your normal activities as told by your health care provider. Ask your health care provider what activities are safe for you. Do not take baths, swim, or use a hot tub until your health care provider approves. Ask your health care provider if you may take showers. Keep all follow-up visits. This is important. Contact a health care provider if: You have a headache that does not go away with medicine. You have hives, rash, or itchy skin. You have nausea or vomiting. You feel unusually tired or weak. You have signs of infection at your IV site. Get help right away if: You have a fever or chills. You urinate less often than usual. Your urine is darker colored than normal. You have any of the following: Trouble breathing. Pain in your back, abdomen, or chest. Cool, clammy skin. A fast heartbeat. Summary Platelets are tiny pieces of blood cells that clump together to form a blood clot when you have an injury. If you have too few platelets, your blood may have trouble clotting. A platelet transfusion is a procedure in which you receive donated platelets through an IV. A platelet transfusion may be used to stop or prevent excessive bleeding. After the procedure, check your IV site every day for signs of infection. This information is not intended to replace advice given to you by your health care provider. Make sure you discuss any questions you have with your health care provider. Document Revised: 07/29/2020 Document Reviewed: 07/29/2020 Elsevier Patient Education  2024 [employer]  

## 2023-01-22 NOTE — Telephone Encounter (Signed)
Received call from pt regarding her new medication to help stop the bleeding in her mouth. She is having a difficult time trying to dissolve the Tranexamic acid tablet.  Called [facility] pharmacy and spoke with a pharmacist. He advised to crush the half tablet the best she could and then put it int he 15-30 ml's of water.  Pt was finally able to get it somewhat dissolved and will crush it more the next time.

## 2023-01-22 NOTE — Progress Notes (Signed)
[facility] Telephone:([phone]) [phone]   Fax:([phone]  PROGRESS NOTE  Patient Care Team: [name], MD as PCP - General (Family Medicine) Otho Ket, RN as [facility] Care Management  Hematological/Oncological History # Acute Myeloid Leukemia  08/09/2022: seen in [facility] emergency department for leukocytosis and anemia/thrombocytopenia. 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 Transferred to [facility] 7/3-7/18/2024: Admitted to [facility], treated with Azacytidine and Venetoclax. Course complicated by neutropenic fever.  7/19-7/25/2024: admitted for fluid overload/heart failure.   Interval History:  [name] 82 y.o. female with medical history significant for AML who presents for a follow up visit. The patient's last visit was on 01/01/2023. In the interim since the last visit she been holding her AML medication at the request of Dr Lowella Bandy.   On exam today [name] reports that she was in the shower yesterday and began spitting blood.  She reports that occurred about 1:30 PM.  She notes it was on the bottom left side of her mouth.  She notes that she is not having any pain or discomfort in that area.  She reports that she does use a soft toothbrush.  She is not having any lightheadedness, dizziness, or shortness of breath.  She is not having any bleeding anywhere else such as nosebleeds, blood in the urine, or blood in the stool.  She denies any black stools.  She reports she is quite tired and does sleep a lot.  She continues to follow with Dr. Burnis Medin at [facility].  Overall she feels well and has no questions concerns or complaints today..  She currently denies any fevers, chills, sweats, nausea vomiting or diarrhea.  A full 10 point ROS is otherwise negative.  MEDICAL HISTORY:  Past Medical History:  Diagnosis Date   Diverticulitis    Former smoker    Hypertension    Lateral meniscus tear 2017   left knee   Migraines     Osteoarthritis    Osteopenia    TGA (transient global amnesia)    secondary to [place]   Thyroid disease     SURGICAL HISTORY: Past Surgical History:  Procedure Laterality Date   DILATION AND CURETTAGE OF UTERUS     menorrhagia   KNEE ARTHROSCOPY Right 10/06   TUBAL LIGATION      SOCIAL HISTORY: Social History   Socioeconomic History   Marital status: Divorced    Spouse name: Not on file   Number of children: 0   Years of education: Not on file   Highest education level: Not on file  Occupational History    Employer: [employer]  Tobacco Use   Smoking status: Former   Smokeless tobacco: Never  Vaping Use   Vaping status: Never Used  Substance and Sexual Activity   Alcohol use: Yes    Comment: 1 every  few months   Drug use: No   Sexual activity: Not Currently    Partners: Male    Birth control/protection: Post-menopausal  Other Topics Concern   Not on file  Social History Narrative   Divorced.   High school graduate.   Previously worked at [employer] at [facility] [facility] [employer].   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   Social Drivers of Corporate investment banker Strain: Not on file  Food Insecurity: No Food Insecurity (09/15/2022)   Hunger Vital Sign    Worried About Running Out of Food in the Last Year: Never true  Ran Out of Food in the Last Year: Never true  Transportation Needs: No Transportation Needs (09/15/2022)   PRAPARE - Administrator, Civil Service (Medical): No    Lack of Transportation (Non-Medical): No  Physical Activity: Not on file  Stress: Not on file  Social Connections: Not on file  Intimate Partner Violence: Not on file    FAMILY HISTORY: Family History  Problem Relation Age of Onset   Heart disease Mother    Diabetes Father    Heart failure Father    COPD Father    Hyperlipidemia Sister    Diabetes Brother    Colon cancer Neg Hx    Breast cancer Neg Hx     ALLERGIES:  is allergic to [place] [zolpidem],  codeine, dust mite extract, grass pollen([id number]-[id number]-swt vern), hydrocodone, statins, sulfa antibiotics, and zofran [ondansetron].  MEDICATIONS:  Current Outpatient Medications  Medication Sig Dispense Refill   tranexamic acid (LYSTEDA) 650 MG TABS tablet Take 1 tablet (650 mg total) by mouth as directed. Break tablet and mix with 15 ml of water. Swish in mouth for 2 minutes, then spit. Repeat up to 4 times per day for 7 days. If no improvement by Wednesday 01/24/2023 please call our office. 28 tablet 0   acyclovir (ZOVIRAX) 400 MG tablet Take 400 mg by mouth 2 (two) times daily.     ALPRAZolam (XANAX) 0.25 MG tablet TAKE 1 TABLET (0.25 MG TOTAL) BY MOUTH DAILY AS NEEDED. 30 tablet 3   cefpodoxime (VANTIN) 200 MG tablet Take 200 mg by mouth 2 (two) times daily. (Patient not taking: Reported on 12/25/2022)     Cyanocobalamin (VITAMIN B-12 PO) Take 1,000 mcg by mouth daily.     furosemide (LASIX) 20 MG tablet Take 20 mg by mouth daily. Prn for weight gain >5 lbs (Patient not taking: Reported on 01/05/2023)     granisetron (KYTRIL) 1 MG tablet Take by mouth.     levofloxacin (LEVAQUIN) 250 MG tablet Take 250 mg by mouth daily.     levothyroxine (SYNTHROID) 50 MCG tablet TAKE 1 TABLET (50 MCG TOTAL) BY MOUTH DAILY. 90 tablet 3   lidocaine-prilocaine (EMLA) cream Apply 1 Application topically as needed. 30 g 0   losartan (COZAAR) 50 MG tablet Take 50 mg by mouth daily.     metoprolol succinate (TOPROL-XL) 25 MG 24 hr tablet Take 1 tablet by mouth daily.     polycarbophil (FIBERCON) 625 MG tablet Take 625 mg by mouth daily.     posaconazole (NOXAFIL) 100 MG TBEC delayed-release tablet Take 300 mg by mouth daily.     prochlorperazine (COMPAZINE) 5 MG tablet Take by mouth.     Red Yeast Rice 600 MG TABS Take 1 tablet (600 mg total) by mouth in the morning and at bedtime.     sertraline (ZOLOFT) 25 MG tablet Take 1 tablet (25 mg total) by mouth daily.     spironolactone (ALDACTONE) 25 MG tablet Take by  mouth.     zinc sulfate 220 (50 Zn) MG capsule Take 220 mg by mouth daily.     No current facility-administered medications for this visit.   Facility-Administered Medications Ordered in Other Visits  Medication Dose Route Frequency Provider Last Rate Last Admin   0.9 %  sodium chloride infusion (Manually program via Guardrails IV Fluids)  250 mL Intravenous Continuous Ulysees Barns IV, MD 10 mL/hr at 01/22/23 0855 100 mL at 01/22/23 0855    REVIEW OF SYSTEMS:   Constitutional: ( - )  fevers, ( - )  chills , ( - ) night sweats Eyes: ( - ) blurriness of vision, ( - ) double vision, ( - ) watery eyes Ears, nose, mouth, throat, and face: ( - ) mucositis, ( - ) sore throat Respiratory: ( - ) cough, ( - ) dyspnea, ( - ) wheezes Cardiovascular: ( - ) palpitation, ( - ) chest discomfort, ( - ) lower extremity swelling Gastrointestinal:  ( - ) nausea, ( - ) heartburn, ( - ) change in bowel habits Skin: ( - ) abnormal skin rashes Lymphatics: ( - ) new lymphadenopathy, ( - ) easy bruising Neurological: ( - ) numbness, ( - ) tingling, ( - ) new weaknesses Behavioral/Psych: ( - ) mood change, ( - ) new changes  All other systems were reviewed with the patient and are negative.  PHYSICAL EXAMINATION: ECOG PERFORMANCE STATUS: 2 - Symptomatic, <50% confined to bed  Vitals:   01/22/23 0900  BP: (!) 131/53  Pulse: 74  Resp: 14  Temp: 98.2 F (36.8 C)  SpO2: 100%    Filed Weights   01/22/23 0900  Weight: 124 lb 9 oz (56.5 kg)     GENERAL: Well-appearing elderly Caucasian female alert, no distress and comfortable SKIN: skin color, texture, turgor are normal, no rashes or significant lesions EYES: conjunctiva are pink and non-injected, sclera clear LUNGS: clear to auscultation and percussion with normal breathing effort HEART: regular rate & rhythm and no murmurs and no lower extremity edema Musculoskeletal: no cyanosis of digits and no clubbing  PSYCH: alert & oriented x 3, fluent  speech NEURO: no focal motor/sensory deficits  LABORATORY DATA:  I have reviewed the data as listed    Latest Ref Rng & Units 01/22/2023    7:40 AM 01/15/2023    8:07 AM 01/11/2023    7:30 AM  CBC  WBC 4.0 - 10.5 K/uL 1.9  2.7  2.3   Hemoglobin 12.0 - 15.0 g/dL 8.5  9.5  08.6   Hematocrit 36.0 - 46.0 % 25.4  27.8  29.7   Platelets 150 - 400 K/uL 16  16  18         Latest Ref Rng & Units 01/22/2023    7:40 AM 01/15/2023    8:07 AM 01/11/2023    7:30 AM  CMP  Glucose 70 - 99 mg/dL 578  469  629   BUN 8 - 23 mg/dL 39  56  36   Creatinine 0.44 - 1.00 mg/dL 5.28  4.13  2.44   Sodium 135 - 145 mmol/L 138  138  140   Potassium 3.5 - 5.1 mmol/L 4.3  4.8  4.5   Chloride 98 - 111 mmol/L 105  103  105   CO2 22 - 32 mmol/L 27  27  29    Calcium 8.9 - 10.3 mg/dL 01.0  27.2  53.6   Total Protein 6.5 - 8.1 g/dL 6.5  6.7  6.7   Total Bilirubin <1.2 mg/dL 0.8  0.8  0.9   Alkaline Phos 38 - 126 U/L 67  69  86   AST 15 - 41 U/L 19  18  19    ALT 0 - 44 U/L 16  24  46     RADIOGRAPHIC STUDIES: No results found.  ASSESSMENT & PLAN [name] 82 y.o. female with medical history significant for AML who presents for a follow up visit.  Previously we discussed the concept of comanaged care.  Comanaged care is when  the patient has a local primary provider who administers local support and therapy while expert advice and treatment recommendations are rendered by a cancer specialist at a large academic center.  In this arrangement we provide local support, labs, treatment, and emergency visits, however the major decisions regarding the course of treatment are decided by a physician at an academic medical center.  The patient voiced her understanding of comanaged care and was agreeable to proceeding forward with this care model.  # Acute Myeloid Leukemia  -- Patient currently follows with Dr. Lowella Bandy at [facility]  She is scheduled for bone marrow biopsy evaluation later this  week. -- We will provide supportive care as requested with labs on Mondays and Thursdays with transfusion as necessary -- Labs today show white blood cell count 1.9, hemoglobin 8.5, MCV 98.1, platelets 16.  Will receive a platelet transfusion today. -- Recommend twice weekly lab checks with a return clinic visit in 8 weeks time. Today was an urgent visit, will keep next visit as scheduled later this month.   # Gum Bleeding -- Patient is having bleeding at the base of one of her left lower molars. -- Started yesterday approximately 1:30 PM. -- Patient is not having any lightheadedness, dizziness, or shortness of breath. -- Recommend tranexamic acid 650 mg and 15 mL of water, swish and spit for 2 minutes. -- Strict return precautions if she were to develop any lightheadedness, dizziness, shortness of breath or if bleeding did not resolve by Wednesday. -- Patient does have a dentist, could consider asking dentistry for topical hemostatic agents if tranexamic acid does not work.  No orders of the defined types were placed in this encounter.   All questions were answered. The patient knows to call the clinic with any problems, questions or concerns.  A total of more than 30 minutes were spent on this encounter with face-to-face time and non-face-to-face time, including preparing to see the patient, ordering tests and/or medications, counseling the patient and coordination of care as outlined above.   Ulysees Barns, MD Department of Hematology/Oncology [facility] at [facility] Phone: [phone] Pager: [phone] Email: [email or web address]  01/22/2023 9:15 AM

## 2023-01-23 ENCOUNTER — Telehealth: Payer: Self-pay | Admitting: *Deleted

## 2023-01-23 LAB — PREPARE PLATELET PHERESIS: Unit division: 0

## 2023-01-23 LAB — BPAM PLATELET PHERESIS
Blood Product Expiration Date: 202412182359
ISSUE DATE / TIME: 202412160911
Unit Type and Rh: 5100

## 2023-01-23 NOTE — Telephone Encounter (Signed)
Returned call to patient in response to following voice mail - Gums stopped bleeding by this morning. She used medicine yesterday afternoon and night and once this morning. She was able to crush tablet by wrapping in foil and hitting with foil wrapped hammer - then dissolved powdered tablet in water. Ms. Risinger asked if she needed to keep using medicine after today if bleeding stopped. Dr. Leonides Schanz informed of message and question. Per Dr. Leonides Schanz, informed patient that she can stop using medication if bleeding has stopped. If bleeding resumes, restart medicine and notify office. Ms. Wargel said she will use it again after dinner and before bedtime tonight then stop.

## 2023-01-25 ENCOUNTER — Inpatient Hospital Stay: Payer: Medicare Other

## 2023-01-25 ENCOUNTER — Other Ambulatory Visit: Payer: Self-pay | Admitting: *Deleted

## 2023-01-25 DIAGNOSIS — C9200 Acute myeloblastic leukemia, not having achieved remission: Secondary | ICD-10-CM

## 2023-01-25 LAB — CBC WITH DIFFERENTIAL (CANCER CENTER ONLY)
Abs Immature Granulocytes: 0.01 10*3/uL (ref 0.00–0.07)
Basophils Absolute: 0 10*3/uL (ref 0.0–0.1)
Basophils Relative: 1 %
Eosinophils Absolute: 0 10*3/uL (ref 0.0–0.5)
Eosinophils Relative: 2 %
HCT: 22.8 % — ABNORMAL LOW (ref 36.0–46.0)
Hemoglobin: 7.8 g/dL — ABNORMAL LOW (ref 12.0–15.0)
Immature Granulocytes: 1 %
Lymphocytes Relative: 55 %
Lymphs Abs: 1.1 10*3/uL (ref 0.7–4.0)
MCH: 33.5 pg (ref 26.0–34.0)
MCHC: 34.2 g/dL (ref 30.0–36.0)
MCV: 97.9 fL (ref 80.0–100.0)
Monocytes Absolute: 0.2 10*3/uL (ref 0.1–1.0)
Monocytes Relative: 8 %
Neutro Abs: 0.6 10*3/uL — ABNORMAL LOW (ref 1.7–7.7)
Neutrophils Relative %: 33 %
Platelet Count: 45 10*3/uL — ABNORMAL LOW (ref 150–400)
RBC: 2.33 MIL/uL — ABNORMAL LOW (ref 3.87–5.11)
RDW: 20.1 % — ABNORMAL HIGH (ref 11.5–15.5)
WBC Count: 1.9 10*3/uL — ABNORMAL LOW (ref 4.0–10.5)
nRBC: 0 % (ref 0.0–0.2)

## 2023-01-25 LAB — CMP (CANCER CENTER ONLY)
ALT: 16 U/L (ref 0–44)
AST: 22 U/L (ref 15–41)
Albumin: 4.2 g/dL (ref 3.5–5.0)
Alkaline Phosphatase: 72 U/L (ref 38–126)
Anion gap: 7 (ref 5–15)
BUN: 44 mg/dL — ABNORMAL HIGH (ref 8–23)
CO2: 28 mmol/L (ref 22–32)
Calcium: 9.9 mg/dL (ref 8.9–10.3)
Chloride: 104 mmol/L (ref 98–111)
Creatinine: 1.14 mg/dL — ABNORMAL HIGH (ref 0.44–1.00)
GFR, Estimated: 49 mL/min — ABNORMAL LOW (ref >60)
Glucose, Bld: 112 mg/dL — ABNORMAL HIGH (ref 70–99)
Potassium: 4.6 mmol/L (ref 3.5–5.1)
Sodium: 139 mmol/L (ref 135–145)
Total Bilirubin: 0.8 mg/dL (ref <1.2)
Total Protein: 6.5 g/dL (ref 6.5–8.1)

## 2023-01-25 LAB — SAMPLE TO BLOOD BANK

## 2023-01-25 LAB — PREPARE RBC (CROSSMATCH)

## 2023-01-25 LAB — MAGNESIUM: Magnesium: 2.1 mg/dL (ref 1.7–2.4)

## 2023-01-25 MED ORDER — HEPARIN SOD (PORK) LOCK FLUSH 100 UNIT/ML IV SOLN
500.0000 [IU] | Freq: Every day | INTRAVENOUS | Status: AC | PRN
  Administered 2023-01-25: 500 [IU]

## 2023-01-25 MED ORDER — SODIUM CHLORIDE 0.9% FLUSH
10.0000 mL | INTRAVENOUS | Status: AC | PRN
  Administered 2023-01-25: 10 mL

## 2023-01-25 MED ORDER — SODIUM CHLORIDE 0.9% IV SOLUTION
250.0000 mL | INTRAVENOUS | Status: DC
  Administered 2023-01-25: 100 mL via INTRAVENOUS

## 2023-01-25 MED ORDER — SODIUM CHLORIDE 0.9% FLUSH
10.0000 mL | Freq: Once | INTRAVENOUS | Status: AC
  Administered 2023-01-25: 10 mL

## 2023-01-25 MED ORDER — ACETAMINOPHEN 325 MG PO TABS
650.0000 mg | ORAL_TABLET | Freq: Once | ORAL | Status: AC
  Administered 2023-01-25: 650 mg via ORAL
  Filled 2023-01-25: qty 2

## 2023-01-25 NOTE — Patient Instructions (Signed)

## 2023-01-26 LAB — BPAM RBC
Blood Product Expiration Date: 202501072359
ISSUE DATE / TIME: 202412190958
Unit Type and Rh: 6200

## 2023-01-26 LAB — TYPE AND SCREEN
ABO/RH(D): A POS
Antibody Screen: NEGATIVE
Unit division: 0

## 2023-01-29 ENCOUNTER — Inpatient Hospital Stay: Payer: Medicare Other

## 2023-01-29 ENCOUNTER — Other Ambulatory Visit: Payer: Self-pay

## 2023-01-29 DIAGNOSIS — C9200 Acute myeloblastic leukemia, not having achieved remission: Secondary | ICD-10-CM

## 2023-01-29 LAB — CMP (CANCER CENTER ONLY)
ALT: 15 U/L (ref 0–44)
AST: 18 U/L (ref 15–41)
Albumin: 4.1 g/dL (ref 3.5–5.0)
Alkaline Phosphatase: 68 U/L (ref 38–126)
Anion gap: 8 (ref 5–15)
BUN: 31 mg/dL — ABNORMAL HIGH (ref 8–23)
CO2: 27 mmol/L (ref 22–32)
Calcium: 9.8 mg/dL (ref 8.9–10.3)
Chloride: 107 mmol/L (ref 98–111)
Creatinine: 0.92 mg/dL (ref 0.44–1.00)
GFR, Estimated: >60 mL/min (ref >60)
Glucose, Bld: 106 mg/dL — ABNORMAL HIGH (ref 70–99)
Potassium: 4.2 mmol/L (ref 3.5–5.1)
Sodium: 142 mmol/L (ref 135–145)
Total Bilirubin: 0.8 mg/dL (ref <1.2)
Total Protein: 6.3 g/dL — ABNORMAL LOW (ref 6.5–8.1)

## 2023-01-29 LAB — SAMPLE TO BLOOD BANK

## 2023-01-29 LAB — CBC WITH DIFFERENTIAL (CANCER CENTER ONLY)
Abs Immature Granulocytes: 0 10*3/uL (ref 0.00–0.07)
Basophils Absolute: 0 10*3/uL (ref 0.0–0.1)
Basophils Relative: 0 %
Eosinophils Absolute: 0 10*3/uL (ref 0.0–0.5)
Eosinophils Relative: 2 %
HCT: 25.9 % — ABNORMAL LOW (ref 36.0–46.0)
Hemoglobin: 8.8 g/dL — ABNORMAL LOW (ref 12.0–15.0)
Immature Granulocytes: 0 %
Lymphocytes Relative: 50 %
Lymphs Abs: 0.9 10*3/uL (ref 0.7–4.0)
MCH: 33 pg (ref 26.0–34.0)
MCHC: 34 g/dL (ref 30.0–36.0)
MCV: 97 fL (ref 80.0–100.0)
Monocytes Absolute: 0.1 10*3/uL (ref 0.1–1.0)
Monocytes Relative: 7 %
Neutro Abs: 0.7 10*3/uL — ABNORMAL LOW (ref 1.7–7.7)
Neutrophils Relative %: 41 %
Platelet Count: 25 10*3/uL — ABNORMAL LOW (ref 150–400)
RBC: 2.67 MIL/uL — ABNORMAL LOW (ref 3.87–5.11)
RDW: 19.8 % — ABNORMAL HIGH (ref 11.5–15.5)
WBC Count: 1.8 10*3/uL — ABNORMAL LOW (ref 4.0–10.5)
nRBC: 0 % (ref 0.0–0.2)

## 2023-01-29 LAB — PREPARE RBC (CROSSMATCH)

## 2023-01-29 LAB — MAGNESIUM: Magnesium: 1.9 mg/dL (ref 1.7–2.4)

## 2023-01-29 MED ORDER — HEPARIN SOD (PORK) LOCK FLUSH 100 UNIT/ML IV SOLN
250.0000 [IU] | INTRAVENOUS | Status: AC | PRN
  Administered 2023-01-29: 250 [IU]

## 2023-01-29 MED ORDER — SODIUM CHLORIDE 0.9% FLUSH
10.0000 mL | Freq: Once | INTRAVENOUS | Status: AC
  Administered 2023-01-29: 10 mL

## 2023-01-29 MED ORDER — SODIUM CHLORIDE 0.9% IV SOLUTION
250.0000 mL | INTRAVENOUS | Status: DC
  Administered 2023-01-29: 250 mL via INTRAVENOUS

## 2023-01-29 MED ORDER — SODIUM CHLORIDE 0.9% FLUSH
10.0000 mL | INTRAVENOUS | Status: AC | PRN
  Administered 2023-01-29: 10 mL

## 2023-01-29 MED ORDER — ACETAMINOPHEN 325 MG PO TABS
650.0000 mg | ORAL_TABLET | Freq: Once | ORAL | Status: AC
  Administered 2023-01-29: 650 mg via ORAL
  Filled 2023-01-29: qty 2

## 2023-01-29 MED ORDER — DIPHENHYDRAMINE HCL 25 MG PO CAPS
25.0000 mg | ORAL_CAPSULE | Freq: Once | ORAL | Status: AC
  Administered 2023-01-29: 25 mg via ORAL
  Filled 2023-01-29: qty 1

## 2023-01-29 NOTE — Patient Instructions (Signed)

## 2023-01-30 LAB — TYPE AND SCREEN
ABO/RH(D): A POS
Antibody Screen: NEGATIVE
Unit division: 0

## 2023-01-30 LAB — BPAM RBC
Blood Product Expiration Date: 202501072359
ISSUE DATE / TIME: 202412230959
Unit Type and Rh: 6200

## 2023-02-01 ENCOUNTER — Inpatient Hospital Stay: Payer: Medicare Other

## 2023-02-01 ENCOUNTER — Inpatient Hospital Stay: Payer: Medicare Other | Admitting: Hematology and Oncology

## 2023-02-02 ENCOUNTER — Inpatient Hospital Stay: Payer: Medicare Other

## 2023-02-02 ENCOUNTER — Inpatient Hospital Stay: Payer: Medicare Other | Admitting: Hematology and Oncology

## 2023-02-02 VITALS — BP 124/68 | HR 56 | Temp 98.1°F | Resp 15 | Ht 66.0 in | Wt 126.6 lb

## 2023-02-02 DIAGNOSIS — C9200 Acute myeloblastic leukemia, not having achieved remission: Secondary | ICD-10-CM

## 2023-02-02 DIAGNOSIS — Z95828 Presence of other vascular implants and grafts: Secondary | ICD-10-CM

## 2023-02-02 LAB — CBC WITH DIFFERENTIAL (CANCER CENTER ONLY)
Abs Immature Granulocytes: 0 10*3/uL (ref 0.00–0.07)
Basophils Absolute: 0 10*3/uL (ref 0.0–0.1)
Basophils Relative: 0 %
Eosinophils Absolute: 0 10*3/uL (ref 0.0–0.5)
Eosinophils Relative: 1 %
HCT: 28.5 % — ABNORMAL LOW (ref 36.0–46.0)
Hemoglobin: 9.6 g/dL — ABNORMAL LOW (ref 12.0–15.0)
Immature Granulocytes: 0 %
Lymphocytes Relative: 56 %
Lymphs Abs: 1.1 10*3/uL (ref 0.7–4.0)
MCH: 33 pg (ref 26.0–34.0)
MCHC: 33.7 g/dL (ref 30.0–36.0)
MCV: 97.9 fL (ref 80.0–100.0)
Monocytes Absolute: 0.1 10*3/uL (ref 0.1–1.0)
Monocytes Relative: 7 %
Neutro Abs: 0.7 10*3/uL — ABNORMAL LOW (ref 1.7–7.7)
Neutrophils Relative %: 36 %
Platelet Count: 22 10*3/uL — ABNORMAL LOW (ref 150–400)
RBC: 2.91 MIL/uL — ABNORMAL LOW (ref 3.87–5.11)
RDW: 19.6 % — ABNORMAL HIGH (ref 11.5–15.5)
WBC Count: 1.9 10*3/uL — ABNORMAL LOW (ref 4.0–10.5)
nRBC: 0 % (ref 0.0–0.2)

## 2023-02-02 LAB — CMP (CANCER CENTER ONLY)
ALT: 13 U/L (ref 0–44)
AST: 17 U/L (ref 15–41)
Albumin: 4.1 g/dL (ref 3.5–5.0)
Alkaline Phosphatase: 65 U/L (ref 38–126)
Anion gap: 5 (ref 5–15)
BUN: 27 mg/dL — ABNORMAL HIGH (ref 8–23)
CO2: 28 mmol/L (ref 22–32)
Calcium: 9.7 mg/dL (ref 8.9–10.3)
Chloride: 108 mmol/L (ref 98–111)
Creatinine: 0.77 mg/dL (ref 0.44–1.00)
GFR, Estimated: >60 mL/min (ref >60)
Glucose, Bld: 111 mg/dL — ABNORMAL HIGH (ref 70–99)
Potassium: 4.2 mmol/L (ref 3.5–5.1)
Sodium: 141 mmol/L (ref 135–145)
Total Bilirubin: 0.7 mg/dL (ref <1.2)
Total Protein: 6.5 g/dL (ref 6.5–8.1)

## 2023-02-02 LAB — SAMPLE TO BLOOD BANK

## 2023-02-02 LAB — MAGNESIUM: Magnesium: 1.8 mg/dL (ref 1.7–2.4)

## 2023-02-02 MED ORDER — SODIUM CHLORIDE 0.9% FLUSH
10.0000 mL | Freq: Once | INTRAVENOUS | Status: AC
  Administered 2023-02-02: 10 mL via INTRAVENOUS

## 2023-02-02 MED ORDER — SODIUM CHLORIDE 0.9% FLUSH
10.0000 mL | Freq: Once | INTRAVENOUS | Status: AC
  Administered 2023-02-02: 10 mL

## 2023-02-02 MED ORDER — HEPARIN SOD (PORK) LOCK FLUSH 100 UNIT/ML IV SOLN
500.0000 [IU] | Freq: Once | INTRAVENOUS | Status: AC
  Administered 2023-02-02: 500 [IU] via INTRAVENOUS

## 2023-02-02 NOTE — Progress Notes (Signed)
[facility] Telephone:(336) [phone]   Fax:(336) [phone]  PROGRESS NOTE  Patient Care Team: [name], MD as PCP - General (Family Medicine) Otho Ket, RN as [facility] Care Management  Hematological/Oncological History # Acute Myeloid Leukemia  08/09/2022: seen in [facility] emergency department for leukocytosis and anemia/thrombocytopenia. 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 Transferred to [facility] 7/3-7/18/2024: Admitted to [facility], treated with Azacytidine and Venetoclax. Course complicated by neutropenic fever.  7/19-7/25/2024: admitted for fluid overload/heart failure.   Interval History:  [name] 82 y.o. female with medical history significant for AML who presents for a follow up visit. The patient's last visit was on 01/01/2023. In the interim since the last visit she been holding her AML medication at the request of Dr Lowella Bandy.   On exam today [name] reports she was taken off Levaquin and posaconazole at her last visit at [facility].  She reports that there was a family gathering of 9-10 people but she decided to sit it out because of her weak immune system.  She reports that she was able to control her oral bleeding with the tranexamic acid pills and water solution.  She reports that she can also afford liquid antifibrinolytic's (Amicar?) as prescribed by Dr. Lowella Bandy.  She reports that she has not had any further episodes of bleeding, bruising, or dark stools.  She reports her energy levels have been good but her appetite still remains quite poor.  She currently denies any fevers, chills, sweats, nausea vomiting or diarrhea.  A full 10 point ROS is otherwise negative.  MEDICAL HISTORY:  Past Medical History:  Diagnosis Date   Diverticulitis    Former smoker    Hypertension    Lateral meniscus tear 2017   left knee   Migraines    Osteoarthritis    Osteopenia    TGA (transient global amnesia)    secondary to [place]   Thyroid disease      SURGICAL HISTORY: Past Surgical History:  Procedure Laterality Date   DILATION AND CURETTAGE OF UTERUS     menorrhagia   KNEE ARTHROSCOPY Right 10/06   TUBAL LIGATION      SOCIAL HISTORY: Social History   Socioeconomic History   Marital status: Divorced    Spouse name: Not on file   Number of children: 0   Years of education: Not on file   Highest education level: Not on file  Occupational History    Employer: [employer]  Tobacco Use   Smoking status: Former   Smokeless tobacco: Never  Vaping Use   Vaping status: Never Used  Substance and Sexual Activity   Alcohol use: Yes    Comment: 1 every  few months   Drug use: No   Sexual activity: Not Currently    Partners: Male    Birth control/protection: Post-menopausal  Other Topics Concern   Not on file  Social History Narrative   Divorced.   High school graduate.   Previously worked at [employer] at [employer].   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   Social Drivers of Corporate investment banker Strain: Not on file  Food Insecurity: No Food Insecurity (09/15/2022)   Hunger Vital Sign    Worried About Running Out of Food in the Last Year: Never true    Ran Out of Food in the Last Year: Never true  Transportation Needs: No Transportation Needs (09/15/2022)   PRAPARE - Transportation    Lack  of Transportation (Medical): No    Lack of Transportation (Non-Medical): No  Physical Activity: Not on file  Stress: Not on file  Social Connections: Not on file  Intimate Partner Violence: Not on file    FAMILY HISTORY: Family History  Problem Relation Age of Onset   Heart disease Mother    Diabetes Father    Heart failure Father    COPD Father    Hyperlipidemia Sister    Diabetes Brother    Colon cancer Neg Hx    Breast cancer Neg Hx     ALLERGIES:  is allergic to [place] [zolpidem], codeine, dust mite extract, grass pollen(k-o-r-t-swt vern), hydrocodone, statins, sulfa antibiotics, and zofran  [ondansetron].  MEDICATIONS:  Current Outpatient Medications  Medication Sig Dispense Refill   acyclovir (ZOVIRAX) 400 MG tablet Take 400 mg by mouth 2 (two) times daily.     ALPRAZolam (XANAX) 0.25 MG tablet TAKE 1 TABLET (0.25 MG TOTAL) BY MOUTH DAILY AS NEEDED. 30 tablet 3   Cyanocobalamin (VITAMIN B-12 PO) Take 1,000 mcg by mouth daily.     furosemide (LASIX) 20 MG tablet Take 20 mg by mouth daily. Prn for weight gain >5 lbs (Patient not taking: Reported on 01/25/2023)     levofloxacin (LEVAQUIN) 250 MG tablet Take 250 mg by mouth daily. (Patient not taking: Reported on 01/25/2023)     levothyroxine (SYNTHROID) 50 MCG tablet TAKE 1 TABLET (50 MCG TOTAL) BY MOUTH DAILY. 90 tablet 3   lidocaine-prilocaine (EMLA) cream Apply 1 Application topically as needed. 30 g 0   losartan (COZAAR) 50 MG tablet Take 50 mg by mouth daily.     metoprolol succinate (TOPROL-XL) 25 MG 24 hr tablet Take 1 tablet by mouth daily.     polycarbophil (FIBERCON) 625 MG tablet Take 625 mg by mouth daily.     posaconazole (NOXAFIL) 100 MG TBEC delayed-release tablet Take 300 mg by mouth daily. (Patient not taking: Reported on 01/25/2023)     prochlorperazine (COMPAZINE) 5 MG tablet Take by mouth. (Patient not taking: Reported on 01/25/2023)     Red Yeast Rice 600 MG TABS Take 1 tablet (600 mg total) by mouth in the morning and at bedtime.     sertraline (ZOLOFT) 25 MG tablet Take 1 tablet (25 mg total) by mouth daily.     spironolactone (ALDACTONE) 25 MG tablet Take by mouth.     tranexamic acid (LYSTEDA) 650 MG TABS tablet Take 1 tablet (650 mg total) by mouth as directed. Break tablet and mix with 15 ml of water. Swish in mouth for 2 minutes, then spit. Repeat up to 4 times per day for 7 days. If no improvement by Wednesday 01/24/2023 please call our office. (Patient not taking: Reported on 02/02/2023) 30 tablet 0   zinc sulfate 220 (50 Zn) MG capsule Take 220 mg by mouth daily.     No current facility-administered  medications for this visit.    REVIEW OF SYSTEMS:   Constitutional: ( - ) fevers, ( - )  chills , ( - ) night sweats Eyes: ( - ) blurriness of vision, ( - ) double vision, ( - ) watery eyes Ears, nose, mouth, throat, and face: ( - ) mucositis, ( - ) sore throat Respiratory: ( - ) cough, ( - ) dyspnea, ( - ) wheezes Cardiovascular: ( - ) palpitation, ( - ) chest discomfort, ( - ) lower extremity swelling Gastrointestinal:  ( - ) nausea, ( - ) heartburn, ( - ) change in  bowel habits Skin: ( - ) abnormal skin rashes Lymphatics: ( - ) new lymphadenopathy, ( - ) easy bruising Neurological: ( - ) numbness, ( - ) tingling, ( - ) new weaknesses Behavioral/Psych: ( - ) mood change, ( - ) new changes  All other systems were reviewed with the patient and are negative.  PHYSICAL EXAMINATION: ECOG PERFORMANCE STATUS: 2 - Symptomatic, <50% confined to bed  Vitals:   02/02/23 0832  BP: 124/68  Pulse: (!) 56  Resp: 15  Temp: 98.1 F (36.7 C)  SpO2: 100%    Filed Weights   02/02/23 0832  Weight: 126 lb 9.6 oz (57.4 kg)     GENERAL: Well-appearing elderly Caucasian female alert, no distress and comfortable SKIN: skin color, texture, turgor are normal, no rashes or significant lesions EYES: conjunctiva are pink and non-injected, sclera clear LUNGS: clear to auscultation and percussion with normal breathing effort HEART: regular rate & rhythm and no murmurs and no lower extremity edema Musculoskeletal: no cyanosis of digits and no clubbing  PSYCH: alert & oriented x 3, fluent speech NEURO: no focal motor/sensory deficits  LABORATORY DATA:  I have reviewed the data as listed    Latest Ref Rng & Units 02/02/2023    8:19 AM 01/29/2023    7:35 AM 01/25/2023    7:47 AM  CBC  WBC 4.0 - 10.5 K/uL 1.9  1.8  1.9   Hemoglobin 12.0 - 15.0 g/dL 9.6  8.8  7.8   Hematocrit 36.0 - 46.0 % 28.5  25.9  22.8   Platelets 150 - 400 K/uL 22  25  45        Latest Ref Rng & Units 02/02/2023    8:19  AM 01/29/2023    7:35 AM 01/25/2023    7:47 AM  CMP  Glucose 70 - 99 mg/dL 604  540  981   BUN 8 - 23 mg/dL 27  31  44   Creatinine 0.44 - 1.00 mg/dL 1.91  4.78  2.95   Sodium 135 - 145 mmol/L 141  142  139   Potassium 3.5 - 5.1 mmol/L 4.2  4.2  4.6   Chloride 98 - 111 mmol/L 108  107  104   CO2 22 - 32 mmol/L 28  27  28    Calcium 8.9 - 10.3 mg/dL 9.7  9.8  9.9   Total Protein 6.5 - 8.1 g/dL 6.5  6.3  6.5   Total Bilirubin <1.2 mg/dL 0.7  0.8  0.8   Alkaline Phos 38 - 126 U/L 65  68  72   AST 15 - 41 U/L 17  18  22    ALT 0 - 44 U/L 13  15  16      RADIOGRAPHIC STUDIES: No results found.  ASSESSMENT & PLAN [name] 82 y.o. female with medical history significant for AML who presents for a follow up visit.  Previously we discussed the concept of comanaged care.  Comanaged care is when the patient has a local primary provider who administers local support and therapy while expert advice and treatment recommendations are rendered by a cancer specialist at a large academic center.  In this arrangement we provide local support, labs, treatment, and emergency visits, however the major decisions regarding the course of treatment are decided by a physician at an academic medical center.  The patient voiced her understanding of comanaged care and was agreeable to proceeding forward with this care model.  # Acute Myeloid Leukemia  --  Patient currently follows with Dr. Lowella Bandy at [facility].   -- We will provide supportive care as requested with labs on Mondays and Thursdays with transfusion as necessary -- Labs today show white blood cell count 1.9, hemoglobin 9.6, MCV 97.9, platelets 22 -- Recommend twice weekly lab checks with a return clinic visit in 8 weeks time. Today was an urgent visit, will keep next visit as scheduled later this month.   # Gum Bleeding--resolved.  -- Patient is having bleeding at the base of one of her left lower molars. -- Patient is not  having any lightheadedness, dizziness, or shortness of breath. -- Recommend tranexamic acid 650 mg and 15 mL of water, swish and spit for 2 minutes. -- Strict return precautions if she were to develop any lightheadedness, dizziness, shortness of breath or if bleeding did not resolve by Wednesday. -- also received a prescription for Amicar   No orders of the defined types were placed in this encounter.   All questions were answered. The patient knows to call the clinic with any problems, questions or concerns.  A total of more than 30 minutes were spent on this encounter with face-to-face time and non-face-to-face time, including preparing to see the patient, ordering tests and/or medications, counseling the patient and coordination of care as outlined above.   Ulysees Barns, MD Department of Hematology/Oncology [facility] at [facility] Phone: [phone] Pager: [phone] Email: [email or web address]  02/02/2023 1:36 PM

## 2023-02-05 ENCOUNTER — Inpatient Hospital Stay: Payer: Medicare Other

## 2023-02-05 DIAGNOSIS — C9200 Acute myeloblastic leukemia, not having achieved remission: Secondary | ICD-10-CM

## 2023-02-05 LAB — SAMPLE TO BLOOD BANK

## 2023-02-05 LAB — CBC WITH DIFFERENTIAL (CANCER CENTER ONLY)
Abs Immature Granulocytes: 0 10*3/uL (ref 0.00–0.07)
Basophils Absolute: 0 10*3/uL (ref 0.0–0.1)
Basophils Relative: 0 %
Eosinophils Absolute: 0 10*3/uL (ref 0.0–0.5)
Eosinophils Relative: 1 %
HCT: 28.1 % — ABNORMAL LOW (ref 36.0–46.0)
Hemoglobin: 9.5 g/dL — ABNORMAL LOW (ref 12.0–15.0)
Immature Granulocytes: 0 %
Lymphocytes Relative: 48 %
Lymphs Abs: 0.9 10*3/uL (ref 0.7–4.0)
MCH: 33.2 pg (ref 26.0–34.0)
MCHC: 33.8 g/dL (ref 30.0–36.0)
MCV: 98.3 fL (ref 80.0–100.0)
Monocytes Absolute: 0.1 10*3/uL (ref 0.1–1.0)
Monocytes Relative: 7 %
Neutro Abs: 0.8 10*3/uL — ABNORMAL LOW (ref 1.7–7.7)
Neutrophils Relative %: 44 %
Platelet Count: 29 10*3/uL — ABNORMAL LOW (ref 150–400)
RBC: 2.86 MIL/uL — ABNORMAL LOW (ref 3.87–5.11)
RDW: 19.9 % — ABNORMAL HIGH (ref 11.5–15.5)
WBC Count: 1.8 10*3/uL — ABNORMAL LOW (ref 4.0–10.5)
nRBC: 0 % (ref 0.0–0.2)

## 2023-02-05 LAB — CMP (CANCER CENTER ONLY)
ALT: 14 U/L (ref 0–44)
AST: 18 U/L (ref 15–41)
Albumin: 4.1 g/dL (ref 3.5–5.0)
Alkaline Phosphatase: 61 U/L (ref 38–126)
Anion gap: 7 (ref 5–15)
BUN: 27 mg/dL — ABNORMAL HIGH (ref 8–23)
CO2: 28 mmol/L (ref 22–32)
Calcium: 9.8 mg/dL (ref 8.9–10.3)
Chloride: 107 mmol/L (ref 98–111)
Creatinine: 0.8 mg/dL (ref 0.44–1.00)
GFR, Estimated: >60 mL/min (ref >60)
Glucose, Bld: 108 mg/dL — ABNORMAL HIGH (ref 70–99)
Potassium: 4.4 mmol/L (ref 3.5–5.1)
Sodium: 142 mmol/L (ref 135–145)
Total Bilirubin: 0.7 mg/dL (ref <1.2)
Total Protein: 6.5 g/dL (ref 6.5–8.1)

## 2023-02-05 LAB — MAGNESIUM: Magnesium: 1.9 mg/dL (ref 1.7–2.4)

## 2023-02-05 MED ORDER — SODIUM CHLORIDE 0.9% FLUSH
10.0000 mL | Freq: Once | INTRAVENOUS | Status: AC
  Administered 2023-02-05: 10 mL

## 2023-02-05 MED ORDER — HEPARIN SOD (PORK) LOCK FLUSH 100 UNIT/ML IV SOLN
500.0000 [IU] | Freq: Every day | INTRAVENOUS | Status: AC | PRN
  Administered 2023-02-05: 500 [IU]

## 2023-02-05 NOTE — Progress Notes (Signed)
Hgb 9.5, Plt 29. Per pt parameters, does not meet requirement for transfusion. Pt currently at baseline with no changes. Port [employer]. Labs reviewed. Pt aware of next apt on 02/09/23. Pt ambulated to lobby for d/c.

## 2023-02-09 ENCOUNTER — Inpatient Hospital Stay: Payer: Medicare Other

## 2023-02-09 ENCOUNTER — Other Ambulatory Visit: Payer: Self-pay | Admitting: *Deleted

## 2023-02-09 ENCOUNTER — Inpatient Hospital Stay: Payer: Medicare Other | Attending: Hematology and Oncology

## 2023-02-09 DIAGNOSIS — C9200 Acute myeloblastic leukemia, not having achieved remission: Secondary | ICD-10-CM | POA: Insufficient documentation

## 2023-02-09 LAB — CBC WITH DIFFERENTIAL (CANCER CENTER ONLY)
Abs Immature Granulocytes: 0.01 K/uL (ref 0.00–0.07)
Basophils Absolute: 0 K/uL (ref 0.0–0.1)
Basophils Relative: 0 %
Eosinophils Absolute: 0 K/uL (ref 0.0–0.5)
Eosinophils Relative: 1 %
HCT: 26.4 % — ABNORMAL LOW (ref 36.0–46.0)
Hemoglobin: 8.9 g/dL — ABNORMAL LOW (ref 12.0–15.0)
Immature Granulocytes: 1 %
Lymphocytes Relative: 54 %
Lymphs Abs: 0.9 K/uL (ref 0.7–4.0)
MCH: 33.7 pg (ref 26.0–34.0)
MCHC: 33.7 g/dL (ref 30.0–36.0)
MCV: 100 fL (ref 80.0–100.0)
Monocytes Absolute: 0.1 K/uL (ref 0.1–1.0)
Monocytes Relative: 5 %
Neutro Abs: 0.7 K/uL — ABNORMAL LOW (ref 1.7–7.7)
Neutrophils Relative %: 39 %
Platelet Count: 44 K/uL — ABNORMAL LOW (ref 150–400)
RBC: 2.64 MIL/uL — ABNORMAL LOW (ref 3.87–5.11)
RDW: 21.2 % — ABNORMAL HIGH (ref 11.5–15.5)
WBC Count: 1.7 K/uL — ABNORMAL LOW (ref 4.0–10.5)
nRBC: 0 % (ref 0.0–0.2)

## 2023-02-09 LAB — SAMPLE TO BLOOD BANK

## 2023-02-09 LAB — CMP (CANCER CENTER ONLY)
ALT: 12 U/L (ref 0–44)
AST: 16 U/L (ref 15–41)
Albumin: 4.1 g/dL (ref 3.5–5.0)
Alkaline Phosphatase: 75 U/L (ref 38–126)
Anion gap: 7 (ref 5–15)
BUN: 25 mg/dL — ABNORMAL HIGH (ref 8–23)
CO2: 28 mmol/L (ref 22–32)
Calcium: 9.6 mg/dL (ref 8.9–10.3)
Chloride: 104 mmol/L (ref 98–111)
Creatinine: 0.79 mg/dL (ref 0.44–1.00)
GFR, Estimated: >60 mL/min (ref >60)
Glucose, Bld: 116 mg/dL — ABNORMAL HIGH (ref 70–99)
Potassium: 4.2 mmol/L (ref 3.5–5.1)
Sodium: 139 mmol/L (ref 135–145)
Total Bilirubin: 0.6 mg/dL (ref 0.0–1.2)
Total Protein: 6.5 g/dL (ref 6.5–8.1)

## 2023-02-09 LAB — MAGNESIUM: Magnesium: 1.7 mg/dL (ref 1.7–2.4)

## 2023-02-09 LAB — PREPARE RBC (CROSSMATCH)

## 2023-02-09 MED ORDER — ACETAMINOPHEN 325 MG PO TABS
650.0000 mg | ORAL_TABLET | Freq: Once | ORAL | Status: AC
  Administered 2023-02-09: 650 mg via ORAL
  Filled 2023-02-09: qty 2

## 2023-02-09 MED ORDER — SODIUM CHLORIDE 0.9% FLUSH
10.0000 mL | Freq: Once | INTRAVENOUS | Status: AC
Start: 2023-02-09 — End: 2023-02-09
  Administered 2023-02-09: 10 mL

## 2023-02-09 MED ORDER — SODIUM CHLORIDE 0.9% IV SOLUTION
250.0000 mL | INTRAVENOUS | Status: DC
  Administered 2023-02-09: 100 mL via INTRAVENOUS

## 2023-02-09 NOTE — Patient Instructions (Signed)

## 2023-02-12 ENCOUNTER — Inpatient Hospital Stay: Payer: Medicare Other

## 2023-02-12 ENCOUNTER — Other Ambulatory Visit: Payer: Self-pay

## 2023-02-12 DIAGNOSIS — C9200 Acute myeloblastic leukemia, not having achieved remission: Secondary | ICD-10-CM | POA: Diagnosis not present

## 2023-02-12 LAB — CBC WITH DIFFERENTIAL (CANCER CENTER ONLY)
Abs Immature Granulocytes: 0 10*3/uL (ref 0.00–0.07)
Basophils Absolute: 0 10*3/uL (ref 0.0–0.1)
Basophils Relative: 0 %
Eosinophils Absolute: 0 10*3/uL (ref 0.0–0.5)
Eosinophils Relative: 2 %
HCT: 32.1 % — ABNORMAL LOW (ref 36.0–46.0)
Hemoglobin: 10.7 g/dL — ABNORMAL LOW (ref 12.0–15.0)
Immature Granulocytes: 0 %
Lymphocytes Relative: 54 %
Lymphs Abs: 1 10*3/uL (ref 0.7–4.0)
MCH: 32.1 pg (ref 26.0–34.0)
MCHC: 33.3 g/dL (ref 30.0–36.0)
MCV: 96.4 fL (ref 80.0–100.0)
Monocytes Absolute: 0.1 10*3/uL (ref 0.1–1.0)
Monocytes Relative: 7 %
Neutro Abs: 0.7 10*3/uL — ABNORMAL LOW (ref 1.7–7.7)
Neutrophils Relative %: 37 %
Platelet Count: 50 10*3/uL — ABNORMAL LOW (ref 150–400)
RBC: 3.33 MIL/uL — ABNORMAL LOW (ref 3.87–5.11)
RDW: 21.9 % — ABNORMAL HIGH (ref 11.5–15.5)
WBC Count: 1.8 10*3/uL — ABNORMAL LOW (ref 4.0–10.5)
nRBC: 0 % (ref 0.0–0.2)

## 2023-02-12 LAB — TYPE AND SCREEN
ABO/RH(D): A POS
Antibody Screen: NEGATIVE
Unit division: 0

## 2023-02-12 LAB — BPAM RBC
Blood Product Expiration Date: 202501232359
ISSUE DATE / TIME: 202501031019
Unit Type and Rh: 6200

## 2023-02-12 LAB — CMP (CANCER CENTER ONLY)
ALT: 12 U/L (ref 0–44)
AST: 16 U/L (ref 15–41)
Albumin: 4.1 g/dL (ref 3.5–5.0)
Alkaline Phosphatase: 71 U/L (ref 38–126)
Anion gap: 7 (ref 5–15)
BUN: 25 mg/dL — ABNORMAL HIGH (ref 8–23)
CO2: 27 mmol/L (ref 22–32)
Calcium: 9.6 mg/dL (ref 8.9–10.3)
Chloride: 105 mmol/L (ref 98–111)
Creatinine: 0.77 mg/dL (ref 0.44–1.00)
GFR, Estimated: >60 mL/min (ref >60)
Glucose, Bld: 111 mg/dL — ABNORMAL HIGH (ref 70–99)
Potassium: 4.3 mmol/L (ref 3.5–5.1)
Sodium: 139 mmol/L (ref 135–145)
Total Bilirubin: 0.8 mg/dL (ref 0.0–1.2)
Total Protein: 6.6 g/dL (ref 6.5–8.1)

## 2023-02-12 LAB — MAGNESIUM: Magnesium: 1.8 mg/dL (ref 1.7–2.4)

## 2023-02-12 LAB — SAMPLE TO BLOOD BANK

## 2023-02-12 MED ORDER — SODIUM CHLORIDE 0.9% FLUSH: 10.0000 mL | Freq: Once | INTRAVENOUS | Status: DC

## 2023-02-15 DIAGNOSIS — C9200 Acute myeloblastic leukemia, not having achieved remission: Secondary | ICD-10-CM | POA: Diagnosis not present

## 2023-02-19 ENCOUNTER — Inpatient Hospital Stay: Payer: Medicare Other

## 2023-02-19 ENCOUNTER — Other Ambulatory Visit: Payer: Medicare Other

## 2023-02-19 ENCOUNTER — Telehealth: Payer: Self-pay | Admitting: Hematology and Oncology

## 2023-02-19 ENCOUNTER — Other Ambulatory Visit: Payer: Self-pay | Admitting: Surgery

## 2023-02-19 DIAGNOSIS — C9200 Acute myeloblastic leukemia, not having achieved remission: Secondary | ICD-10-CM

## 2023-02-19 LAB — CBC WITH DIFFERENTIAL (CANCER CENTER ONLY)
Abs Immature Granulocytes: 0.01 10*3/uL (ref 0.00–0.07)
Basophils Absolute: 0 10*3/uL (ref 0.0–0.1)
Basophils Relative: 0 %
Eosinophils Absolute: 0 10*3/uL (ref 0.0–0.5)
Eosinophils Relative: 1 %
HCT: 30 % — ABNORMAL LOW (ref 36.0–46.0)
Hemoglobin: 10 g/dL — ABNORMAL LOW (ref 12.0–15.0)
Immature Granulocytes: 1 %
Lymphocytes Relative: 61 %
Lymphs Abs: 1.2 10*3/uL (ref 0.7–4.0)
MCH: 32.6 pg (ref 26.0–34.0)
MCHC: 33.3 g/dL (ref 30.0–36.0)
MCV: 97.7 fL (ref 80.0–100.0)
Monocytes Absolute: 0.1 10*3/uL (ref 0.1–1.0)
Monocytes Relative: 5 %
Neutro Abs: 0.6 10*3/uL — ABNORMAL LOW (ref 1.7–7.7)
Neutrophils Relative %: 32 %
Platelet Count: 77 10*3/uL — ABNORMAL LOW (ref 150–400)
RBC: 3.07 MIL/uL — ABNORMAL LOW (ref 3.87–5.11)
RDW: 22 % — ABNORMAL HIGH (ref 11.5–15.5)
WBC Count: 2 10*3/uL — ABNORMAL LOW (ref 4.0–10.5)
nRBC: 0 % (ref 0.0–0.2)

## 2023-02-19 LAB — CMP (CANCER CENTER ONLY)
ALT: 16 U/L (ref 0–44)
AST: 19 U/L (ref 15–41)
Albumin: 4.1 g/dL (ref 3.5–5.0)
Alkaline Phosphatase: 85 U/L (ref 38–126)
Anion gap: 7 (ref 5–15)
BUN: 24 mg/dL — ABNORMAL HIGH (ref 8–23)
CO2: 26 mmol/L (ref 22–32)
Calcium: 10.1 mg/dL (ref 8.9–10.3)
Chloride: 105 mmol/L (ref 98–111)
Creatinine: 0.96 mg/dL (ref 0.44–1.00)
GFR, Estimated: 60 mL/min — ABNORMAL LOW (ref >60)
Glucose, Bld: 148 mg/dL — ABNORMAL HIGH (ref 70–99)
Potassium: 3.8 mmol/L (ref 3.5–5.1)
Sodium: 138 mmol/L (ref 135–145)
Total Bilirubin: 0.9 mg/dL (ref 0.0–1.2)
Total Protein: 6.9 g/dL (ref 6.5–8.1)

## 2023-02-19 LAB — MAGNESIUM: Magnesium: 1.6 mg/dL — ABNORMAL LOW (ref 1.7–2.4)

## 2023-02-19 LAB — SAMPLE TO BLOOD BANK

## 2023-02-19 MED ORDER — SODIUM CHLORIDE 0.9% FLUSH
10.0000 mL | Freq: Once | INTRAVENOUS | Status: AC
  Administered 2023-02-19: 10 mL

## 2023-02-19 NOTE — Progress Notes (Signed)
 Reviewd lab results with patient. She did not require blood or platelet transfusion today. Stable at discharge and no concerns at this time.

## 2023-02-22 ENCOUNTER — Inpatient Hospital Stay: Payer: Medicare Other

## 2023-02-22 DIAGNOSIS — C9200 Acute myeloblastic leukemia, not having achieved remission: Secondary | ICD-10-CM

## 2023-02-22 LAB — MAGNESIUM: Magnesium: 1.8 mg/dL (ref 1.7–2.4)

## 2023-02-22 LAB — CBC WITH DIFFERENTIAL (CANCER CENTER ONLY)
Abs Immature Granulocytes: 0 10*3/uL (ref 0.00–0.07)
Basophils Absolute: 0 10*3/uL (ref 0.0–0.1)
Basophils Relative: 0 %
Eosinophils Absolute: 0 10*3/uL (ref 0.0–0.5)
Eosinophils Relative: 1 %
HCT: 28.4 % — ABNORMAL LOW (ref 36.0–46.0)
Hemoglobin: 9.5 g/dL — ABNORMAL LOW (ref 12.0–15.0)
Immature Granulocytes: 0 %
Lymphocytes Relative: 49 %
Lymphs Abs: 0.7 10*3/uL (ref 0.7–4.0)
MCH: 32.8 pg (ref 26.0–34.0)
MCHC: 33.5 g/dL (ref 30.0–36.0)
MCV: 97.9 fL (ref 80.0–100.0)
Monocytes Absolute: 0.1 10*3/uL (ref 0.1–1.0)
Monocytes Relative: 10 %
Neutro Abs: 0.6 10*3/uL — ABNORMAL LOW (ref 1.7–7.7)
Neutrophils Relative %: 40 %
Platelet Count: 69 10*3/uL — ABNORMAL LOW (ref 150–400)
RBC: 2.9 MIL/uL — ABNORMAL LOW (ref 3.87–5.11)
RDW: 21.7 % — ABNORMAL HIGH (ref 11.5–15.5)
WBC Count: 1.5 10*3/uL — ABNORMAL LOW (ref 4.0–10.5)
nRBC: 0 % (ref 0.0–0.2)

## 2023-02-22 LAB — CMP (CANCER CENTER ONLY)
ALT: 15 U/L (ref 0–44)
AST: 17 U/L (ref 15–41)
Albumin: 4 g/dL (ref 3.5–5.0)
Alkaline Phosphatase: 85 U/L (ref 38–126)
Anion gap: 8 (ref 5–15)
BUN: 26 mg/dL — ABNORMAL HIGH (ref 8–23)
CO2: 27 mmol/L (ref 22–32)
Calcium: 10 mg/dL (ref 8.9–10.3)
Chloride: 103 mmol/L (ref 98–111)
Creatinine: 1.03 mg/dL — ABNORMAL HIGH (ref 0.44–1.00)
GFR, Estimated: 55 mL/min — ABNORMAL LOW (ref >60)
Glucose, Bld: 126 mg/dL — ABNORMAL HIGH (ref 70–99)
Potassium: 4 mmol/L (ref 3.5–5.1)
Sodium: 138 mmol/L (ref 135–145)
Total Bilirubin: 1.1 mg/dL (ref 0.0–1.2)
Total Protein: 7 g/dL (ref 6.5–8.1)

## 2023-02-22 LAB — SAMPLE TO BLOOD BANK

## 2023-02-22 MED ORDER — SODIUM CHLORIDE 0.9% FLUSH
10.0000 mL | Freq: Once | INTRAVENOUS | Status: AC
  Administered 2023-02-22: 10 mL

## 2023-02-22 NOTE — Progress Notes (Signed)
Patient's HGB is 9.5 and does not require blood transfusion.

## 2023-02-26 ENCOUNTER — Inpatient Hospital Stay: Payer: Medicare Other

## 2023-02-26 DIAGNOSIS — C9200 Acute myeloblastic leukemia, not having achieved remission: Secondary | ICD-10-CM | POA: Diagnosis not present

## 2023-02-26 LAB — CMP (CANCER CENTER ONLY)
ALT: 14 U/L (ref 0–44)
AST: 17 U/L (ref 15–41)
Albumin: 3.8 g/dL (ref 3.5–5.0)
Alkaline Phosphatase: 84 U/L (ref 38–126)
Anion gap: 5 (ref 5–15)
BUN: 28 mg/dL — ABNORMAL HIGH (ref 8–23)
CO2: 28 mmol/L (ref 22–32)
Calcium: 9.7 mg/dL (ref 8.9–10.3)
Chloride: 105 mmol/L (ref 98–111)
Creatinine: 1.12 mg/dL — ABNORMAL HIGH (ref 0.44–1.00)
GFR, Estimated: 50 mL/min — ABNORMAL LOW (ref >60)
Glucose, Bld: 112 mg/dL — ABNORMAL HIGH (ref 70–99)
Potassium: 4.5 mmol/L (ref 3.5–5.1)
Sodium: 138 mmol/L (ref 135–145)
Total Bilirubin: 0.6 mg/dL (ref 0.0–1.2)
Total Protein: 7.1 g/dL (ref 6.5–8.1)

## 2023-02-26 LAB — CBC WITH DIFFERENTIAL (CANCER CENTER ONLY)
Abs Immature Granulocytes: 0.01 10*3/uL (ref 0.00–0.07)
Basophils Absolute: 0 10*3/uL (ref 0.0–0.1)
Basophils Relative: 0 %
Eosinophils Absolute: 0 10*3/uL (ref 0.0–0.5)
Eosinophils Relative: 1 %
HCT: 28.1 % — ABNORMAL LOW (ref 36.0–46.0)
Hemoglobin: 9.2 g/dL — ABNORMAL LOW (ref 12.0–15.0)
Immature Granulocytes: 1 %
Lymphocytes Relative: 61 %
Lymphs Abs: 0.9 10*3/uL (ref 0.7–4.0)
MCH: 32.9 pg (ref 26.0–34.0)
MCHC: 32.7 g/dL (ref 30.0–36.0)
MCV: 100.4 fL — ABNORMAL HIGH (ref 80.0–100.0)
Monocytes Absolute: 0.1 10*3/uL (ref 0.1–1.0)
Monocytes Relative: 7 %
Neutro Abs: 0.5 10*3/uL — ABNORMAL LOW (ref 1.7–7.7)
Neutrophils Relative %: 30 %
Platelet Count: 75 10*3/uL — ABNORMAL LOW (ref 150–400)
RBC: 2.8 MIL/uL — ABNORMAL LOW (ref 3.87–5.11)
RDW: 21.7 % — ABNORMAL HIGH (ref 11.5–15.5)
WBC Count: 1.5 10*3/uL — ABNORMAL LOW (ref 4.0–10.5)
nRBC: 0 % (ref 0.0–0.2)

## 2023-02-26 LAB — SAMPLE TO BLOOD BANK

## 2023-02-26 LAB — MAGNESIUM: Magnesium: 2 mg/dL (ref 1.7–2.4)

## 2023-02-26 MED ORDER — SODIUM CHLORIDE 0.9% FLUSH
10.0000 mL | Freq: Once | INTRAVENOUS | Status: AC
Start: 2023-02-26 — End: 2023-02-26
  Administered 2023-02-26: 10 mL

## 2023-02-26 NOTE — Progress Notes (Signed)
Per Dr Leonides Schanz, pt does not need PRBC or PLT today with Hgb 9.2 and PLT 75.  Pt stated understanding and port de-accessed.  Pt d/c to lobby in stable condition.

## 2023-02-28 ENCOUNTER — Telehealth: Payer: Self-pay | Admitting: *Deleted

## 2023-02-28 ENCOUNTER — Other Ambulatory Visit: Payer: Self-pay | Admitting: *Deleted

## 2023-02-28 NOTE — Telephone Encounter (Signed)
Received vm message from pt requesting a call back as her gums are bleeding again and it has not stopped with the tranexamic acid crushed tablets she used in December. TCT patient and spoke with her. She state as this bleeding is coming from 2 teeth on the left lower jaw in the back. She has used the Amicar solution she received from Dr. Lowella Bandy and the transexamic acid from Dr. Leonides Schanz with out any relief from the bleeding.  Advised to continue the tranexamic acid at least 1-2 more times. If this doesn't stop the bleeding, advised that she will need to go to the ED. Advised that her platelet count was good on 02/26/23 @ 75K.  She states she comes her again tomorrow morning for lab check again. Advised that I will see her in the morning to see how she is doing. Pt is agreeable to all of the above.

## 2023-03-01 ENCOUNTER — Inpatient Hospital Stay: Payer: Medicare Other

## 2023-03-01 ENCOUNTER — Telehealth: Payer: Self-pay | Admitting: *Deleted

## 2023-03-01 DIAGNOSIS — C9200 Acute myeloblastic leukemia, not having achieved remission: Secondary | ICD-10-CM

## 2023-03-01 LAB — SAMPLE TO BLOOD BANK

## 2023-03-01 LAB — CMP (CANCER CENTER ONLY)
ALT: 15 U/L (ref 0–44)
AST: 19 U/L (ref 15–41)
Albumin: 3.9 g/dL (ref 3.5–5.0)
Alkaline Phosphatase: 87 U/L (ref 38–126)
Anion gap: 8 (ref 5–15)
BUN: 21 mg/dL (ref 8–23)
CO2: 28 mmol/L (ref 22–32)
Calcium: 10.2 mg/dL (ref 8.9–10.3)
Chloride: 103 mmol/L (ref 98–111)
Creatinine: 1.22 mg/dL — ABNORMAL HIGH (ref 0.44–1.00)
GFR, Estimated: 45 mL/min — ABNORMAL LOW (ref >60)
Glucose, Bld: 106 mg/dL — ABNORMAL HIGH (ref 70–99)
Potassium: 4.2 mmol/L (ref 3.5–5.1)
Sodium: 139 mmol/L (ref 135–145)
Total Bilirubin: 0.8 mg/dL (ref 0.0–1.2)
Total Protein: 7.4 g/dL (ref 6.5–8.1)

## 2023-03-01 LAB — CBC WITH DIFFERENTIAL (CANCER CENTER ONLY)
Abs Immature Granulocytes: 0 10*3/uL (ref 0.00–0.07)
Basophils Absolute: 0 10*3/uL (ref 0.0–0.1)
Basophils Relative: 1 %
Eosinophils Absolute: 0 10*3/uL (ref 0.0–0.5)
Eosinophils Relative: 1 %
HCT: 27.9 % — ABNORMAL LOW (ref 36.0–46.0)
Hemoglobin: 9.3 g/dL — ABNORMAL LOW (ref 12.0–15.0)
Immature Granulocytes: 0 %
Lymphocytes Relative: 71 %
Lymphs Abs: 1 10*3/uL (ref 0.7–4.0)
MCH: 32.2 pg (ref 26.0–34.0)
MCHC: 33.3 g/dL (ref 30.0–36.0)
MCV: 96.5 fL (ref 80.0–100.0)
Monocytes Absolute: 0.1 10*3/uL (ref 0.1–1.0)
Monocytes Relative: 6 %
Neutro Abs: 0.3 10*3/uL — CL (ref 1.7–7.7)
Neutrophils Relative %: 21 %
Platelet Count: 99 10*3/uL — ABNORMAL LOW (ref 150–400)
RBC: 2.89 MIL/uL — ABNORMAL LOW (ref 3.87–5.11)
RDW: 21.4 % — ABNORMAL HIGH (ref 11.5–15.5)
Smear Review: NORMAL
WBC Count: 1.3 10*3/uL — ABNORMAL LOW (ref 4.0–10.5)
nRBC: 0 % (ref 0.0–0.2)

## 2023-03-01 LAB — MAGNESIUM: Magnesium: 1.8 mg/dL (ref 1.7–2.4)

## 2023-03-01 MED ORDER — SODIUM CHLORIDE 0.9% FLUSH
10.0000 mL | INTRAVENOUS | Status: DC | PRN
  Administered 2023-03-01: 10 mL via INTRAVENOUS

## 2023-03-01 MED ORDER — SODIUM CHLORIDE 0.9% FLUSH
10.0000 mL | Freq: Once | INTRAVENOUS | Status: AC
  Administered 2023-03-01: 10 mL

## 2023-03-01 MED ORDER — HEPARIN SOD (PORK) LOCK FLUSH 100 UNIT/ML IV SOLN
500.0000 [IU] | Freq: Once | INTRAVENOUS | Status: AC
  Administered 2023-03-01: 500 [IU] via INTRAVENOUS

## 2023-03-01 NOTE — Progress Notes (Signed)
CRITICAL VALUE STICKER  CRITICAL VALUE: ANC 0.3  RECEIVER (on-site recipient of call): Morrie Sheldon   DATE & TIME NOTIFIED: 1/23 @ [phone]  MESSENGER (representative from lab): Byrd Hesselbach   MD NOTIFIED: Dr. Beatris Ship   TIME OF NOTIFICATION: [phone]  RESPONSE: Patient has appt for blood products today

## 2023-03-01 NOTE — Progress Notes (Deleted)
 Pt's hgb is 9.3. She does not need a transfusion today. Pt aware, and she also denies any further gum bleeding. Pt deaccessed in the flush room.

## 2023-03-01 NOTE — Addendum Note (Signed)
Addended by: Barton Fanny on: 03/01/2023 08:47 AM   Modules accepted: Orders

## 2023-03-01 NOTE — Telephone Encounter (Signed)
Met with pt in waiting area outside flush rooms. Pt's CBC has resulted. Platelets 99k this am. HGB 9.3. Advised that she does not need any transfusions this morning. Advised that her WBC/ANC remains low. Reminded her of neutropenic precautions. Asked pt about her gums bleeding. She states the bleeding has stopped as of early this morning. Advised that her platelets are quite good this am so she should not see any active bleeding. Advised to eat soft foods, no foods with sharp edges like crackers, chips etc. She is aware to use Amicar or transexamic acid for any gum bleeding should it happen again. Pt's port will be deaccessed in he flush room.

## 2023-03-05 ENCOUNTER — Inpatient Hospital Stay: Payer: Medicare Other

## 2023-03-05 DIAGNOSIS — C9200 Acute myeloblastic leukemia, not having achieved remission: Secondary | ICD-10-CM | POA: Diagnosis not present

## 2023-03-05 LAB — CBC WITH DIFFERENTIAL (CANCER CENTER ONLY)
Abs Immature Granulocytes: 0.04 10*3/uL (ref 0.00–0.07)
Basophils Absolute: 0 10*3/uL (ref 0.0–0.1)
Basophils Relative: 0 %
Eosinophils Absolute: 0 10*3/uL (ref 0.0–0.5)
Eosinophils Relative: 1 %
HCT: 28.9 % — ABNORMAL LOW (ref 36.0–46.0)
Hemoglobin: 9.5 g/dL — ABNORMAL LOW (ref 12.0–15.0)
Immature Granulocytes: 3 %
Lymphocytes Relative: 66 %
Lymphs Abs: 1.1 10*3/uL (ref 0.7–4.0)
MCH: 32.9 pg (ref 26.0–34.0)
MCHC: 32.9 g/dL (ref 30.0–36.0)
MCV: 100 fL (ref 80.0–100.0)
Monocytes Absolute: 0.1 10*3/uL (ref 0.1–1.0)
Monocytes Relative: 9 %
Neutro Abs: 0.3 10*3/uL — CL (ref 1.7–7.7)
Neutrophils Relative %: 21 %
Platelet Count: 107 10*3/uL — ABNORMAL LOW (ref 150–400)
RBC: 2.89 MIL/uL — ABNORMAL LOW (ref 3.87–5.11)
RDW: 21.1 % — ABNORMAL HIGH (ref 11.5–15.5)
WBC Count: 1.6 10*3/uL — ABNORMAL LOW (ref 4.0–10.5)
nRBC: 0 % (ref 0.0–0.2)

## 2023-03-05 LAB — CMP (CANCER CENTER ONLY)
ALT: 14 U/L (ref 0–44)
AST: 18 U/L (ref 15–41)
Albumin: 4 g/dL (ref 3.5–5.0)
Alkaline Phosphatase: 78 U/L (ref 38–126)
Anion gap: 8 (ref 5–15)
BUN: 29 mg/dL — ABNORMAL HIGH (ref 8–23)
CO2: 27 mmol/L (ref 22–32)
Calcium: 10.2 mg/dL (ref 8.9–10.3)
Chloride: 104 mmol/L (ref 98–111)
Creatinine: 1.25 mg/dL — ABNORMAL HIGH (ref 0.44–1.00)
GFR, Estimated: 44 mL/min — ABNORMAL LOW (ref >60)
Glucose, Bld: 115 mg/dL — ABNORMAL HIGH (ref 70–99)
Potassium: 4.4 mmol/L (ref 3.5–5.1)
Sodium: 139 mmol/L (ref 135–145)
Total Bilirubin: 0.6 mg/dL (ref 0.0–1.2)
Total Protein: 7.4 g/dL (ref 6.5–8.1)

## 2023-03-05 LAB — MAGNESIUM: Magnesium: 2 mg/dL (ref 1.7–2.4)

## 2023-03-05 LAB — SAMPLE TO BLOOD BANK

## 2023-03-05 MED ORDER — SODIUM CHLORIDE 0.9% FLUSH: 10.0000 mL | Freq: Once | INTRAVENOUS | Status: DC

## 2023-03-05 MED ORDER — SODIUM CHLORIDE 0.9% FLUSH
10.0000 mL | INTRAVENOUS | Status: DC | PRN
  Administered 2023-03-05: 10 mL via INTRAVENOUS

## 2023-03-05 MED ORDER — HEPARIN SOD (PORK) LOCK FLUSH 100 UNIT/ML IV SOLN: 500.0000 [IU] | Freq: Once | INTRAVENOUS | Status: DC

## 2023-03-05 NOTE — Progress Notes (Signed)
CRITICAL VALUE STICKER  CRITICAL VALUE: ANC 0.3  RECEIVER (on-site recipient of call): Morrie Sheldon   DATE & TIME NOTIFIED: 1/27 @ 0800  MESSENGER (representative from lab): Heather   MD NOTIFIED: Dr. Beatris Ship   TIME OF NOTIFICATION: 1610  RESPONSE: patient has blood product appt today

## 2023-03-05 NOTE — Progress Notes (Signed)
Patient did not require blood today, HGB was 9.5.

## 2023-03-08 ENCOUNTER — Inpatient Hospital Stay: Payer: Medicare Other

## 2023-03-08 DIAGNOSIS — C9200 Acute myeloblastic leukemia, not having achieved remission: Secondary | ICD-10-CM

## 2023-03-08 LAB — CBC WITH DIFFERENTIAL (CANCER CENTER ONLY)
Abs Immature Granulocytes: 0.03 10*3/uL (ref 0.00–0.07)
Basophils Absolute: 0 10*3/uL (ref 0.0–0.1)
Basophils Relative: 0 %
Eosinophils Absolute: 0 10*3/uL (ref 0.0–0.5)
Eosinophils Relative: 1 %
HCT: 28.1 % — ABNORMAL LOW (ref 36.0–46.0)
Hemoglobin: 9.1 g/dL — ABNORMAL LOW (ref 12.0–15.0)
Immature Granulocytes: 2 %
Lymphocytes Relative: 69 %
Lymphs Abs: 1.1 10*3/uL (ref 0.7–4.0)
MCH: 32.9 pg (ref 26.0–34.0)
MCHC: 32.4 g/dL (ref 30.0–36.0)
MCV: 101.4 fL — ABNORMAL HIGH (ref 80.0–100.0)
Monocytes Absolute: 0.2 10*3/uL (ref 0.1–1.0)
Monocytes Relative: 10 %
Neutro Abs: 0.3 10*3/uL — CL (ref 1.7–7.7)
Neutrophils Relative %: 18 %
Platelet Count: 100 10*3/uL — ABNORMAL LOW (ref 150–400)
RBC: 2.77 MIL/uL — ABNORMAL LOW (ref 3.87–5.11)
RDW: 21.2 % — ABNORMAL HIGH (ref 11.5–15.5)
Smear Review: NORMAL
WBC Count: 1.5 10*3/uL — ABNORMAL LOW (ref 4.0–10.5)
nRBC: 0 % (ref 0.0–0.2)

## 2023-03-08 LAB — CMP (CANCER CENTER ONLY)
ALT: 12 U/L (ref 0–44)
AST: 19 U/L (ref 15–41)
Albumin: 4 g/dL (ref 3.5–5.0)
Alkaline Phosphatase: 81 U/L (ref 38–126)
Anion gap: 6 (ref 5–15)
BUN: 35 mg/dL — ABNORMAL HIGH (ref 8–23)
CO2: 27 mmol/L (ref 22–32)
Calcium: 9.8 mg/dL (ref 8.9–10.3)
Chloride: 105 mmol/L (ref 98–111)
Creatinine: 1.29 mg/dL — ABNORMAL HIGH (ref 0.44–1.00)
GFR, Estimated: 42 mL/min — ABNORMAL LOW (ref >60)
Glucose, Bld: 114 mg/dL — ABNORMAL HIGH (ref 70–99)
Potassium: 4.5 mmol/L (ref 3.5–5.1)
Sodium: 138 mmol/L (ref 135–145)
Total Bilirubin: 0.6 mg/dL (ref 0.0–1.2)
Total Protein: 7.3 g/dL (ref 6.5–8.1)

## 2023-03-08 LAB — MAGNESIUM: Magnesium: 2.1 mg/dL (ref 1.7–2.4)

## 2023-03-08 LAB — SAMPLE TO BLOOD BANK

## 2023-03-08 MED ORDER — SODIUM CHLORIDE 0.9% FLUSH
10.0000 mL | Freq: Once | INTRAVENOUS | Status: AC
  Administered 2023-03-08: 10 mL

## 2023-03-08 MED ORDER — HEPARIN SOD (PORK) LOCK FLUSH 100 UNIT/ML IV SOLN
500.0000 [IU] | Freq: Once | INTRAVENOUS | Status: AC
  Administered 2023-03-08: 500 [IU] via INTRAVENOUS

## 2023-03-08 MED ORDER — SODIUM CHLORIDE 0.9% FLUSH
10.0000 mL | INTRAVENOUS | Status: DC | PRN
  Administered 2023-03-08: 10 mL via INTRAVENOUS

## 2023-03-08 NOTE — Progress Notes (Signed)
Patient's HGB is 9.1, patient does not require blood transfusion at this time.

## 2023-03-12 ENCOUNTER — Telehealth: Payer: Self-pay

## 2023-03-12 ENCOUNTER — Inpatient Hospital Stay: Payer: Medicare Other | Attending: Hematology and Oncology

## 2023-03-12 ENCOUNTER — Inpatient Hospital Stay: Payer: Medicare Other

## 2023-03-12 DIAGNOSIS — C9200 Acute myeloblastic leukemia, not having achieved remission: Secondary | ICD-10-CM

## 2023-03-12 LAB — CMP (CANCER CENTER ONLY)
ALT: 12 U/L (ref 0–44)
AST: 18 U/L (ref 15–41)
Albumin: 4 g/dL (ref 3.5–5.0)
Alkaline Phosphatase: 84 U/L (ref 38–126)
Anion gap: 8 (ref 5–15)
BUN: 27 mg/dL — ABNORMAL HIGH (ref 8–23)
CO2: 26 mmol/L (ref 22–32)
Calcium: 9.7 mg/dL (ref 8.9–10.3)
Chloride: 104 mmol/L (ref 98–111)
Creatinine: 1.18 mg/dL — ABNORMAL HIGH (ref 0.44–1.00)
GFR, Estimated: 47 mL/min — ABNORMAL LOW (ref >60)
Glucose, Bld: 111 mg/dL — ABNORMAL HIGH (ref 70–99)
Potassium: 4.3 mmol/L (ref 3.5–5.1)
Sodium: 138 mmol/L (ref 135–145)
Total Bilirubin: 0.7 mg/dL (ref 0.0–1.2)
Total Protein: 7.2 g/dL (ref 6.5–8.1)

## 2023-03-12 LAB — CBC WITH DIFFERENTIAL (CANCER CENTER ONLY)
Abs Immature Granulocytes: 0.01 10*3/uL (ref 0.00–0.07)
Basophils Absolute: 0 10*3/uL (ref 0.0–0.1)
Basophils Relative: 0 %
Eosinophils Absolute: 0 10*3/uL (ref 0.0–0.5)
Eosinophils Relative: 1 %
HCT: 27.5 % — ABNORMAL LOW (ref 36.0–46.0)
Hemoglobin: 8.9 g/dL — ABNORMAL LOW (ref 12.0–15.0)
Immature Granulocytes: 1 %
Lymphocytes Relative: 64 %
Lymphs Abs: 0.8 10*3/uL (ref 0.7–4.0)
MCH: 32.7 pg (ref 26.0–34.0)
MCHC: 32.4 g/dL (ref 30.0–36.0)
MCV: 101.1 fL — ABNORMAL HIGH (ref 80.0–100.0)
Monocytes Absolute: 0.2 10*3/uL (ref 0.1–1.0)
Monocytes Relative: 14 %
Neutro Abs: 0.3 10*3/uL — CL (ref 1.7–7.7)
Neutrophils Relative %: 20 %
Platelet Count: 91 10*3/uL — ABNORMAL LOW (ref 150–400)
RBC: 2.72 MIL/uL — ABNORMAL LOW (ref 3.87–5.11)
RDW: 21.9 % — ABNORMAL HIGH (ref 11.5–15.5)
WBC Count: 1.3 10*3/uL — ABNORMAL LOW (ref 4.0–10.5)
nRBC: 0 % (ref 0.0–0.2)

## 2023-03-12 LAB — SAMPLE TO BLOOD BANK

## 2023-03-12 LAB — MAGNESIUM: Magnesium: 1.8 mg/dL (ref 1.7–2.4)

## 2023-03-12 MED ORDER — SODIUM CHLORIDE 0.9% FLUSH
10.0000 mL | INTRAVENOUS | Status: DC | PRN
  Administered 2023-03-12: 10 mL via INTRAVENOUS

## 2023-03-12 MED ORDER — HEPARIN SOD (PORK) LOCK FLUSH 100 UNIT/ML IV SOLN
500.0000 [IU] | Freq: Once | INTRAVENOUS | Status: AC
  Administered 2023-03-12: 500 [IU] via INTRAVENOUS

## 2023-03-12 MED ORDER — SODIUM CHLORIDE 0.9% FLUSH
10.0000 mL | Freq: Once | INTRAVENOUS | Status: AC
  Administered 2023-03-12: 10 mL

## 2023-03-12 NOTE — Progress Notes (Signed)
Hgb 8.9. Platelets 91. Patient is asymptomatic and reports feeling well. Per Dr Leonides Schanz, no need for transfusion today. Copy of labwork given to patient per request.

## 2023-03-12 NOTE — Telephone Encounter (Signed)
CRITICAL VALUE STICKER  CRITICAL VALUE:   ANC 0.3  RECEIVER (on-site recipient of call):  Daneil Dolin, LPN  DATE & TIME NOTIFIED: 03/12/2023  8:02  MESSENGER (representative from lab): Lanora Manis  MD NOTIFIED: Jeanie Sewer, MD  TIME OF NOTIFICATION: 08:03  RESPONSE:

## 2023-03-12 NOTE — Progress Notes (Deleted)
Labs do not meet parameters for transfusion. Patient reports she is feeling well today and is glad to know her labs are still above parameters. Copy of labwork given to patient per request.

## 2023-03-15 ENCOUNTER — Inpatient Hospital Stay: Payer: Medicare Other

## 2023-03-15 ENCOUNTER — Encounter: Payer: Self-pay | Admitting: Hematology and Oncology

## 2023-03-15 ENCOUNTER — Other Ambulatory Visit: Payer: Self-pay | Admitting: *Deleted

## 2023-03-15 DIAGNOSIS — C9200 Acute myeloblastic leukemia, not having achieved remission: Secondary | ICD-10-CM

## 2023-03-15 LAB — CBC WITH DIFFERENTIAL (CANCER CENTER ONLY)
Abs Immature Granulocytes: 0.01 10*3/uL (ref 0.00–0.07)
Basophils Absolute: 0 10*3/uL (ref 0.0–0.1)
Basophils Relative: 0 %
Eosinophils Absolute: 0 10*3/uL (ref 0.0–0.5)
Eosinophils Relative: 1 %
HCT: 26.5 % — ABNORMAL LOW (ref 36.0–46.0)
Hemoglobin: 8.7 g/dL — ABNORMAL LOW (ref 12.0–15.0)
Immature Granulocytes: 1 %
Lymphocytes Relative: 66 %
Lymphs Abs: 0.9 10*3/uL (ref 0.7–4.0)
MCH: 33.1 pg (ref 26.0–34.0)
MCHC: 32.8 g/dL (ref 30.0–36.0)
MCV: 100.8 fL — ABNORMAL HIGH (ref 80.0–100.0)
Monocytes Absolute: 0.2 10*3/uL (ref 0.1–1.0)
Monocytes Relative: 16 %
Neutro Abs: 0.2 10*3/uL — CL (ref 1.7–7.7)
Neutrophils Relative %: 16 %
Platelet Count: 89 10*3/uL — ABNORMAL LOW (ref 150–400)
RBC: 2.63 MIL/uL — ABNORMAL LOW (ref 3.87–5.11)
RDW: 22.2 % — ABNORMAL HIGH (ref 11.5–15.5)
WBC Count: 1.3 10*3/uL — ABNORMAL LOW (ref 4.0–10.5)
nRBC: 0 % (ref 0.0–0.2)

## 2023-03-15 LAB — SAMPLE TO BLOOD BANK

## 2023-03-15 LAB — CMP (CANCER CENTER ONLY)
ALT: 11 U/L (ref 0–44)
AST: 16 U/L (ref 15–41)
Albumin: 4 g/dL (ref 3.5–5.0)
Alkaline Phosphatase: 81 U/L (ref 38–126)
Anion gap: 5 (ref 5–15)
BUN: 27 mg/dL — ABNORMAL HIGH (ref 8–23)
CO2: 28 mmol/L (ref 22–32)
Calcium: 9.8 mg/dL (ref 8.9–10.3)
Chloride: 103 mmol/L (ref 98–111)
Creatinine: 1.26 mg/dL — ABNORMAL HIGH (ref 0.44–1.00)
GFR, Estimated: 43 mL/min — ABNORMAL LOW (ref >60)
Glucose, Bld: 116 mg/dL — ABNORMAL HIGH (ref 70–99)
Potassium: 4.4 mmol/L (ref 3.5–5.1)
Sodium: 136 mmol/L (ref 135–145)
Total Bilirubin: 0.9 mg/dL (ref 0.0–1.2)
Total Protein: 7.5 g/dL (ref 6.5–8.1)

## 2023-03-15 LAB — PREPARE RBC (CROSSMATCH)

## 2023-03-15 LAB — MAGNESIUM: Magnesium: 2 mg/dL (ref 1.7–2.4)

## 2023-03-15 MED ORDER — SODIUM CHLORIDE 0.9% FLUSH
10.0000 mL | INTRAVENOUS | Status: DC | PRN
  Administered 2023-03-15: 10 mL via INTRAVENOUS

## 2023-03-15 MED ORDER — HEPARIN SOD (PORK) LOCK FLUSH 100 UNIT/ML IV SOLN: 500.0000 [IU] | Freq: Every day | INTRAVENOUS | Status: DC | PRN

## 2023-03-15 MED ORDER — SODIUM CHLORIDE 0.9% FLUSH: 10.0000 mL | INTRAVENOUS | Status: DC | PRN

## 2023-03-15 MED ORDER — ACETAMINOPHEN 325 MG PO TABS
650.0000 mg | ORAL_TABLET | Freq: Once | ORAL | Status: AC
  Administered 2023-03-15: 650 mg via ORAL
  Filled 2023-03-15: qty 2

## 2023-03-15 MED ORDER — SODIUM CHLORIDE 0.9% IV SOLUTION
250.0000 mL | INTRAVENOUS | Status: DC
  Administered 2023-03-15: 250 mL via INTRAVENOUS

## 2023-03-16 LAB — TYPE AND SCREEN
ABO/RH(D): A POS
Antibody Screen: NEGATIVE
Unit division: 0

## 2023-03-16 LAB — BPAM RBC
Blood Product Expiration Date: 202502272359
ISSUE DATE / TIME: 202502061009
Unit Type and Rh: 6200

## 2023-03-19 ENCOUNTER — Inpatient Hospital Stay: Payer: Medicare Other

## 2023-03-19 ENCOUNTER — Telehealth: Payer: Self-pay

## 2023-03-19 VITALS — BP 136/53 | HR 51 | Temp 98.3°F | Resp 20

## 2023-03-19 DIAGNOSIS — C9200 Acute myeloblastic leukemia, not having achieved remission: Secondary | ICD-10-CM

## 2023-03-19 LAB — CMP (CANCER CENTER ONLY)
ALT: 11 U/L (ref 0–44)
AST: 15 U/L (ref 15–41)
Albumin: 3.7 g/dL (ref 3.5–5.0)
Alkaline Phosphatase: 72 U/L (ref 38–126)
Anion gap: 7 (ref 5–15)
BUN: 26 mg/dL — ABNORMAL HIGH (ref 8–23)
CO2: 27 mmol/L (ref 22–32)
Calcium: 9.6 mg/dL (ref 8.9–10.3)
Chloride: 101 mmol/L (ref 98–111)
Creatinine: 0.91 mg/dL (ref 0.44–1.00)
GFR, Estimated: >60 mL/min (ref >60)
Glucose, Bld: 116 mg/dL — ABNORMAL HIGH (ref 70–99)
Potassium: 4.2 mmol/L (ref 3.5–5.1)
Sodium: 135 mmol/L (ref 135–145)
Total Bilirubin: 0.8 mg/dL (ref 0.0–1.2)
Total Protein: 7.2 g/dL (ref 6.5–8.1)

## 2023-03-19 LAB — CBC WITH DIFFERENTIAL (CANCER CENTER ONLY)
Abs Immature Granulocytes: 0.01 10*3/uL (ref 0.00–0.07)
Basophils Absolute: 0 10*3/uL (ref 0.0–0.1)
Basophils Relative: 0 %
Eosinophils Absolute: 0 10*3/uL (ref 0.0–0.5)
Eosinophils Relative: 1 %
HCT: 28.4 % — ABNORMAL LOW (ref 36.0–46.0)
Hemoglobin: 9.3 g/dL — ABNORMAL LOW (ref 12.0–15.0)
Immature Granulocytes: 1 %
Lymphocytes Relative: 72 %
Lymphs Abs: 0.9 10*3/uL (ref 0.7–4.0)
MCH: 32.2 pg (ref 26.0–34.0)
MCHC: 32.7 g/dL (ref 30.0–36.0)
MCV: 98.3 fL (ref 80.0–100.0)
Monocytes Absolute: 0.1 10*3/uL (ref 0.1–1.0)
Monocytes Relative: 11 %
Neutro Abs: 0.2 10*3/uL — CL (ref 1.7–7.7)
Neutrophils Relative %: 15 %
Platelet Count: 66 10*3/uL — ABNORMAL LOW (ref 150–400)
RBC: 2.89 MIL/uL — ABNORMAL LOW (ref 3.87–5.11)
RDW: 21.6 % — ABNORMAL HIGH (ref 11.5–15.5)
WBC Count: 1.1 10*3/uL — ABNORMAL LOW (ref 4.0–10.5)
nRBC: 0 % (ref 0.0–0.2)

## 2023-03-19 LAB — SAMPLE TO BLOOD BANK

## 2023-03-19 LAB — MAGNESIUM: Magnesium: 1.8 mg/dL (ref 1.7–2.4)

## 2023-03-19 MED ORDER — SODIUM CHLORIDE 0.9% FLUSH
10.0000 mL | INTRAVENOUS | Status: DC | PRN
Start: 2023-03-19 — End: 2023-03-19
  Administered 2023-03-19: 10 mL via INTRAVENOUS

## 2023-03-19 MED ORDER — HEPARIN SOD (PORK) LOCK FLUSH 100 UNIT/ML IV SOLN
500.0000 [IU] | Freq: Once | INTRAVENOUS | Status: AC
  Administered 2023-03-19: 500 [IU] via INTRAVENOUS

## 2023-03-19 MED ORDER — SODIUM CHLORIDE 0.9% FLUSH
10.0000 mL | Freq: Once | INTRAVENOUS | Status: AC
  Administered 2023-03-19: 10 mL

## 2023-03-19 NOTE — Progress Notes (Signed)
 Platelets 66 Hgb 9.3 Patient reports feeling well. No need for transfusion today. Vitals stable. Port de-accessed and patient discharged, ambulatory to lobby with sister.

## 2023-03-19 NOTE — Telephone Encounter (Signed)
 CRITICAL VALUE STICKER  CRITICAL VALUE:  ANC 0.2  RECEIVER (on-site recipient of call): Arbie Knock, LPN  DATE & TIME NOTIFIED: 08:50  MESSENGER (representative from lab): Trenia Fritter  MD NOTIFIED: Amparo Balk, MD  TIME OF NOTIFICATION:  08:51  RESPONSE:

## 2023-03-22 DIAGNOSIS — I10 Essential (primary) hypertension: Secondary | ICD-10-CM | POA: Diagnosis not present

## 2023-03-22 DIAGNOSIS — C9200 Acute myeloblastic leukemia, not having achieved remission: Secondary | ICD-10-CM | POA: Diagnosis not present

## 2023-03-26 ENCOUNTER — Inpatient Hospital Stay: Payer: Medicare Other

## 2023-03-26 ENCOUNTER — Telehealth: Payer: Self-pay

## 2023-03-26 ENCOUNTER — Other Ambulatory Visit: Payer: Self-pay | Admitting: *Deleted

## 2023-03-26 DIAGNOSIS — C9200 Acute myeloblastic leukemia, not having achieved remission: Secondary | ICD-10-CM

## 2023-03-26 LAB — CBC WITH DIFFERENTIAL (CANCER CENTER ONLY)
Abs Immature Granulocytes: 0.02 10*3/uL (ref 0.00–0.07)
Basophils Absolute: 0 10*3/uL (ref 0.0–0.1)
Basophils Relative: 0 %
Eosinophils Absolute: 0 10*3/uL (ref 0.0–0.5)
Eosinophils Relative: 1 %
HCT: 27.2 % — ABNORMAL LOW (ref 36.0–46.0)
Hemoglobin: 8.9 g/dL — ABNORMAL LOW (ref 12.0–15.0)
Immature Granulocytes: 2 %
Lymphocytes Relative: 71 %
Lymphs Abs: 0.7 10*3/uL (ref 0.7–4.0)
MCH: 32.4 pg (ref 26.0–34.0)
MCHC: 32.7 g/dL (ref 30.0–36.0)
MCV: 98.9 fL (ref 80.0–100.0)
Monocytes Absolute: 0.1 10*3/uL (ref 0.1–1.0)
Monocytes Relative: 11 %
Neutro Abs: 0.2 10*3/uL — CL (ref 1.7–7.7)
Neutrophils Relative %: 15 %
Platelet Count: 93 10*3/uL — ABNORMAL LOW (ref 150–400)
RBC: 2.75 MIL/uL — ABNORMAL LOW (ref 3.87–5.11)
RDW: 21.3 % — ABNORMAL HIGH (ref 11.5–15.5)
WBC Count: 1 10*3/uL — ABNORMAL LOW (ref 4.0–10.5)
nRBC: 0 % (ref 0.0–0.2)

## 2023-03-26 LAB — CMP (CANCER CENTER ONLY)
ALT: 16 U/L (ref 0–44)
AST: 19 U/L (ref 15–41)
Albumin: 3.6 g/dL (ref 3.5–5.0)
Alkaline Phosphatase: 77 U/L (ref 38–126)
Anion gap: 9 (ref 5–15)
BUN: 37 mg/dL — ABNORMAL HIGH (ref 8–23)
CO2: 26 mmol/L (ref 22–32)
Calcium: 9.6 mg/dL (ref 8.9–10.3)
Chloride: 102 mmol/L (ref 98–111)
Creatinine: 1.11 mg/dL — ABNORMAL HIGH (ref 0.44–1.00)
GFR, Estimated: 50 mL/min — ABNORMAL LOW (ref >60)
Glucose, Bld: 112 mg/dL — ABNORMAL HIGH (ref 70–99)
Potassium: 4.2 mmol/L (ref 3.5–5.1)
Sodium: 137 mmol/L (ref 135–145)
Total Bilirubin: 0.7 mg/dL (ref 0.0–1.2)
Total Protein: 6.9 g/dL (ref 6.5–8.1)

## 2023-03-26 LAB — SAMPLE TO BLOOD BANK

## 2023-03-26 LAB — PREPARE RBC (CROSSMATCH)

## 2023-03-26 LAB — MAGNESIUM: Magnesium: 1.8 mg/dL (ref 1.7–2.4)

## 2023-03-26 MED ORDER — SODIUM CHLORIDE 0.9% IV SOLUTION
250.0000 mL | INTRAVENOUS | Status: DC
  Administered 2023-03-26: 100 mL via INTRAVENOUS

## 2023-03-26 MED ORDER — HEPARIN SOD (PORK) LOCK FLUSH 100 UNIT/ML IV SOLN
500.0000 [IU] | Freq: Every day | INTRAVENOUS | Status: AC | PRN
Start: 2023-03-26 — End: 2023-03-26
  Administered 2023-03-26: 500 [IU]

## 2023-03-26 MED ORDER — SODIUM CHLORIDE 0.9% FLUSH
10.0000 mL | INTRAVENOUS | Status: DC | PRN
  Administered 2023-03-26: 10 mL via INTRAVENOUS

## 2023-03-26 MED ORDER — SODIUM CHLORIDE 0.9% FLUSH
10.0000 mL | INTRAVENOUS | Status: AC | PRN
Start: 2023-03-26 — End: 2023-03-26
  Administered 2023-03-26: 10 mL

## 2023-03-26 MED ORDER — ACETAMINOPHEN 325 MG PO TABS
650.0000 mg | ORAL_TABLET | Freq: Once | ORAL | Status: AC
  Administered 2023-03-26: 650 mg via ORAL
  Filled 2023-03-26: qty 2

## 2023-03-26 NOTE — Patient Instructions (Signed)

## 2023-03-26 NOTE — Telephone Encounter (Signed)
 CRITICAL VALUE STICKER  CRITICAL VALUE:  ANC 0.2  RECEIVER (on-site recipient of call): Daneil Dolin, LPN  DATE & TIME NOTIFIED: 03/26/23  08:55  MESSENGER (representative from lab): Amber  MD NOTIFIED: Jeanie Sewer, MD  TIME OF NOTIFICATION: 08:56  RESPONSE:

## 2023-03-28 LAB — TYPE AND SCREEN
ABO/RH(D): A POS
Antibody Screen: NEGATIVE
Unit division: 0
Unit division: 0

## 2023-03-28 LAB — BPAM RBC
Blood Product Expiration Date: 202503072359
Blood Product Expiration Date: 202503082359
ISSUE DATE / TIME: 202502171003
ISSUE DATE / TIME: 202502171007
Unit Type and Rh: 6200
Unit Type and Rh: 6200

## 2023-03-29 ENCOUNTER — Inpatient Hospital Stay: Payer: Medicare Other

## 2023-04-02 ENCOUNTER — Encounter: Payer: Self-pay | Admitting: *Deleted

## 2023-04-02 ENCOUNTER — Inpatient Hospital Stay: Payer: Medicare Other

## 2023-04-02 VITALS — BP 105/43 | HR 56 | Resp 16

## 2023-04-02 DIAGNOSIS — C9200 Acute myeloblastic leukemia, not having achieved remission: Secondary | ICD-10-CM | POA: Diagnosis not present

## 2023-04-02 LAB — CBC WITH DIFFERENTIAL (CANCER CENTER ONLY)
Abs Immature Granulocytes: 0.03 10*3/uL (ref 0.00–0.07)
Basophils Absolute: 0 10*3/uL (ref 0.0–0.1)
Basophils Relative: 0 %
Eosinophils Absolute: 0 10*3/uL (ref 0.0–0.5)
Eosinophils Relative: 0 %
HCT: 29.9 % — ABNORMAL LOW (ref 36.0–46.0)
Hemoglobin: 9.8 g/dL — ABNORMAL LOW (ref 12.0–15.0)
Immature Granulocytes: 3 %
Lymphocytes Relative: 70 %
Lymphs Abs: 0.7 10*3/uL (ref 0.7–4.0)
MCH: 31.9 pg (ref 26.0–34.0)
MCHC: 32.8 g/dL (ref 30.0–36.0)
MCV: 97.4 fL (ref 80.0–100.0)
Monocytes Absolute: 0.2 10*3/uL (ref 0.1–1.0)
Monocytes Relative: 14 %
Neutro Abs: 0.1 10*3/uL — CL (ref 1.7–7.7)
Neutrophils Relative %: 13 %
Platelet Count: 106 10*3/uL — ABNORMAL LOW (ref 150–400)
RBC: 3.07 MIL/uL — ABNORMAL LOW (ref 3.87–5.11)
RDW: 21.1 % — ABNORMAL HIGH (ref 11.5–15.5)
WBC Count: 1 10*3/uL — ABNORMAL LOW (ref 4.0–10.5)
WBC Morphology: 2
nRBC: 1.9 % — ABNORMAL HIGH (ref 0.0–0.2)

## 2023-04-02 LAB — CMP (CANCER CENTER ONLY)
ALT: 20 U/L (ref 0–44)
AST: 19 U/L (ref 15–41)
Albumin: 3.6 g/dL (ref 3.5–5.0)
Alkaline Phosphatase: 93 U/L (ref 38–126)
Anion gap: 7 (ref 5–15)
BUN: 38 mg/dL — ABNORMAL HIGH (ref 8–23)
CO2: 25 mmol/L (ref 22–32)
Calcium: 9.6 mg/dL (ref 8.9–10.3)
Chloride: 104 mmol/L (ref 98–111)
Creatinine: 1.05 mg/dL — ABNORMAL HIGH (ref 0.44–1.00)
GFR, Estimated: 54 mL/min — ABNORMAL LOW (ref >60)
Glucose, Bld: 130 mg/dL — ABNORMAL HIGH (ref 70–99)
Potassium: 4.1 mmol/L (ref 3.5–5.1)
Sodium: 136 mmol/L (ref 135–145)
Total Bilirubin: 0.7 mg/dL (ref 0.0–1.2)
Total Protein: 7.4 g/dL (ref 6.5–8.1)

## 2023-04-02 LAB — MAGNESIUM: Magnesium: 1.7 mg/dL (ref 1.7–2.4)

## 2023-04-02 LAB — SAMPLE TO BLOOD BANK

## 2023-04-02 MED ORDER — SODIUM CHLORIDE 0.9% FLUSH
10.0000 mL | Freq: Once | INTRAVENOUS | Status: AC
Start: 2023-04-02 — End: 2023-04-02
  Administered 2023-04-02: 10 mL

## 2023-04-02 MED ORDER — HEPARIN SOD (PORK) LOCK FLUSH 100 UNIT/ML IV SOLN
500.0000 [IU] | Freq: Once | INTRAVENOUS | Status: AC
  Administered 2023-04-02: 500 [IU] via INTRAVENOUS

## 2023-04-02 MED ORDER — SODIUM CHLORIDE 0.9% FLUSH
10.0000 mL | Freq: Once | INTRAVENOUS | Status: AC
  Administered 2023-04-02: 10 mL

## 2023-04-02 NOTE — Progress Notes (Signed)
 CRITICAL VALUE STICKER  CRITICAL VALUE: ANC 0.1  RECEIVER (on-site recipient of call): Kim RN  DATE & TIME NOTIFIED: 04/02/2023 @ 8:56 am  MESSENGER (representative from lab): Amber  MD NOTIFIED: Dr Leonides Schanz    TIME OF NOTIFICATION: 8:58 am  RESPONSE: provider aware no actionable items

## 2023-04-02 NOTE — Progress Notes (Signed)
 Pt reported to infusion today for blood transfusion. Hgb 9.8 and Plts 106. Pt not symptomatic. Per Pt's parameters transfuse Plts < 20K and Hgb < 9, Pt does not require blood transfusion today. This RN made Pt aware of lab results and provided copy of labs. Pt verbalized understanding of labs and agreeable with discharge home. VSS. Pt ambulatory to lobby at time of discharge.

## 2023-04-04 ENCOUNTER — Encounter (HOSPITAL_BASED_OUTPATIENT_CLINIC_OR_DEPARTMENT_OTHER): Payer: Self-pay | Admitting: Cardiology

## 2023-04-04 ENCOUNTER — Ambulatory Visit (HOSPITAL_BASED_OUTPATIENT_CLINIC_OR_DEPARTMENT_OTHER): Payer: Medicare Other | Admitting: Cardiology

## 2023-04-04 VITALS — BP 98/52 | HR 67 | Ht 64.0 in | Wt 120.6 lb

## 2023-04-04 DIAGNOSIS — I700 Atherosclerosis of aorta: Secondary | ICD-10-CM | POA: Diagnosis not present

## 2023-04-04 DIAGNOSIS — R5382 Chronic fatigue, unspecified: Secondary | ICD-10-CM

## 2023-04-04 DIAGNOSIS — I952 Hypotension due to drugs: Secondary | ICD-10-CM | POA: Diagnosis not present

## 2023-04-04 DIAGNOSIS — I2510 Atherosclerotic heart disease of native coronary artery without angina pectoris: Secondary | ICD-10-CM | POA: Diagnosis not present

## 2023-04-04 DIAGNOSIS — T466X5D Adverse effect of antihyperlipidemic and antiarteriosclerotic drugs, subsequent encounter: Secondary | ICD-10-CM

## 2023-04-04 DIAGNOSIS — Z712 Person consulting for explanation of examination or test findings: Secondary | ICD-10-CM

## 2023-04-04 DIAGNOSIS — I427 Cardiomyopathy due to drug and external agent: Secondary | ICD-10-CM | POA: Diagnosis not present

## 2023-04-04 DIAGNOSIS — G720 Drug-induced myopathy: Secondary | ICD-10-CM

## 2023-04-04 MED ORDER — LOSARTAN POTASSIUM 25 MG PO TABS: 25.0000 mg | ORAL_TABLET | Freq: Every day | ORAL | Status: DC

## 2023-04-04 NOTE — Progress Notes (Signed)
 Cardiology Office Note:  .   Date:  04/04/2023  ID:  [name], [name] 12/31/1940, MRN [id number] PCP: [name], MD  [facility] Providers Cardiologist:  Jodelle Red, MD {  History of Present Illness: .   [name] is a 83 y.o. female with PMH AML, chemotherapy-induced cardiomyopathy.  Today: Here with her sister [name] today. Reviewed referral notes from Dr. Para March 01/10/23. She previously followed with cardiology through [facility], last seen 09/21/22. 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 Prior echo just before start of chemo showed EF 50-55% 08/10/22. Echo 09/21/22 showed EF 25-30%, small to moderate pericardial effusion.   We reviewed her prior test results together today.  Diagnosed with AML last summer. Reviewed her history of this. She has no chest pain now, breathing is stable, but feels tired all the time. Has tolerated losartan, metoprolol, and spironolactone. Was due for repeat echo through [facility] but wished to transfer her care to [facility].  BP is always 90s/50s. Feels very fatigued. This limits her activity. Did not have low BP prior to AML/HF diagnosis. Was actually treated for hypertension for a time. No edema. Did have significant edema at initial diagnosis, but none since 08/13/2023.  Mother died of MI age 64.  No appetite, intermittent nausea, has been losing weight.   CT 2022/08/13 notes coronary and aortic atherosclerosis  ROS: Denies chest pain, shortness of breath at rest or with normal exertion. No PND, orthopnea, LE edema or unexpected weight gain. No syncope or palpitations. ROS otherwise negative except as noted.   Studies Reviewed: Marland Kitchen    EKG:       Physical Exam:   VS:  BP (!) 98/52 (BP Location: Left Arm, Patient Position: Sitting, Cuff Size: Normal)   Pulse 67   Ht 5\' 4"  (1.626 m)   Wt 120 lb 9.6 oz  (54.7 kg)   LMP 01/19/1992 (Exact Date)   SpO2 97%   BMI 20.70 kg/m    Wt Readings from Last 3 Encounters:  04/04/23 120 lb 9.6 oz (54.7 kg)  02/19/23 130 lb 12.8 oz (59.3 kg)  02/02/23 126 lb 9.6 oz (57.4 kg)    GEN: Well nourished, well developed in no acute distress HEENT: Normal, moist mucous membranes NECK: No JVD CARDIAC: regular rhythm, normal S1 and S2, no rubs or gallops. No murmur. VASCULAR: Radial and DP pulses 2+ bilaterally. No carotid bruits RESPIRATORY:  Clear to auscultation without rales, wheezing or rhonchi  ABDOMEN: Soft, non-tender, non-distended MUSCULOSKELETAL:  Ambulates independently SKIN: Warm and dry, no edema NEUROLOGIC:  Alert and oriented x 3. No focal neuro deficits noted. PSYCHIATRIC:  Normal affect    ASSESSMENT AND PLAN: .    Chemotherapy-induced cardiomyopathy -recheck echo -discussed medical management, her current medications -decreasing losartan as below -has PRN lasix, has not required in months -continue metoprolol succinate 25 mg daily -for now, continue spironolactone 25 mg daily. May need to cut if BP remains low.  Fatigue Drug induced hypotension -BP consistently 90s/50s. Will cut losartan from 50 mg to 25 mg to start, monitor BP, may need to cut further  Coronary calcification and aortic atherosclerosis -had myalgia on statins -now on red yeast rice. We discussed lack of benefit data and potential for side effects. W -last LDL 101 -with age, symptoms, comorbidities, after shared decision making would stop red yeast rice and not treat  cholesterol aggressively  Dispo: 3 mos with APP, 6 mos with me or sooner as needed  Signed, Jodelle Red, MD   Jodelle Red, MD, PhD, [facility] [place]  [facility]  [place]  [facility] & V[facility] at [facility] [address], [address] [place], [place] [address] [phone]

## 2023-04-04 NOTE — Patient Instructions (Addendum)
 Medication Instructions:      DECREASE Losartan 25 mg daily. Monitor your home blood pressure and your energy level.  STOP Red yeast rice  *If you need a refill on your cardiac medications before your next appointment, please call your pharmacy*   Lab Work:  None ordered.  If you have labs (blood work) drawn today and your tests are completely normal, you will receive your results only by: MyChart Message (if you have MyChart) OR A paper copy in the mail If you have any lab test that is abnormal or we need to change your treatment, we will call you to review the results.   Testing/Procedures:  Your physician has requested that you have an echocardiogram. Echocardiography is a painless test that uses sound waves to create images of your heart. It provides your doctor with information about the size and shape of your heart and how well your heart's chambers and valves are working. This procedure takes approximately one hour. There are no restrictions for this procedure. Please do NOT wear cologne, perfume or lotions (deodorant is allowed). Please arrive 15 minutes prior to your appointment time.   Follow-Up: At [facility], you and your health needs are our priority.  As part of our continuing mission to provide you with exceptional heart care, we have created designated Provider Care Teams.  These Care Teams include your primary Cardiologist (physician) and Advanced Practice Providers (APPs -  Physician Assistants and Nurse Practitioners) who all work together to provide you with the care you need, when you need it.  We recommend signing up for the patient portal called "MyChart".  Sign up information is provided on this After Visit Summary.  MyChart is used to connect with patients for Virtual Visits (Telemedicine).  Patients are able to view lab/test results, encounter notes, upcoming appointments, etc.  Non-urgent messages can be sent to your provider as well.   To learn more  about what you can do with MyChart, go to [email or web address].    Your next appointment:   3 month(s) and 6 months Dr. Cristal Deer  Provider:   Gillian Shields, NP    Other Instructions  Your physician wants you to follow-up in: 6 months with Dr. Cristal Deer. You will receive a reminder letter in the mail two months in advance. If you don't receive a letter, please call our office to schedule the follow-up appointment.  Monitor BP at home and call if it remains low or send mychart message.   Blood Pressure Record Sheet To take your blood pressure, you will need a blood pressure machine. You may be prescribed one, or you can buy a blood pressure machine (blood pressure monitor) at your clinic, drug store, or online. When choosing one, look for these features: An automatic monitor that has an arm cuff. A cuff that wraps snugly, but not too tightly, around your upper arm. You should be able to fit only one finger between your arm and the cuff. A device that stores blood pressure reading results. Do not choose a monitor that measures your blood pressure from your wrist or finger. Follow your health care provider's instructions for how to take your blood pressure. To use this form: Get one reading in the morning (a.m.) before you take any medicines. Get one reading in the evening (p.m.) before supper. Take at least two readings with each blood pressure check. This makes sure the results are correct. Wait 1-2 minutes between measurements. Write down the results in the spaces on  this form. Repeat this once a week, or as told by your health care provider. Make a follow-up appointment with your health care provider to discuss the results. Blood pressure log Date: _______________________ a.m. _____________________(1st reading) _____________________(2nd reading) p.m. _____________________(1st reading) _____________________(2nd reading) Date: _______________________ a.m.  _____________________(1st reading) _____________________(2nd reading) p.m. _____________________(1st reading) _____________________(2nd reading) Date: _______________________ a.m. _____________________(1st reading) _____________________(2nd reading) p.m. _____________________(1st reading) _____________________(2nd reading) Date: _______________________ a.m. _____________________(1st reading) _____________________(2nd reading) p.m. _____________________(1st reading) _____________________(2nd reading) Date: _______________________ a.m. _____________________(1st reading) _____________________(2nd reading) p.m. _____________________(1st reading) _____________________(2nd reading) This information is not intended to replace advice given to you by your health care provider. Make sure you discuss any questions you have with your health care provider. Document Revised: 10/07/2020 Document Reviewed: 10/07/2020 Elsevier Patient Education  2024 [employer].

## 2023-04-05 ENCOUNTER — Telehealth: Payer: Self-pay

## 2023-04-05 ENCOUNTER — Inpatient Hospital Stay: Payer: Medicare Other

## 2023-04-05 DIAGNOSIS — C9200 Acute myeloblastic leukemia, not having achieved remission: Secondary | ICD-10-CM

## 2023-04-05 LAB — CBC WITH DIFFERENTIAL (CANCER CENTER ONLY)
Abs Immature Granulocytes: 0.04 10*3/uL (ref 0.00–0.07)
Basophils Absolute: 0 10*3/uL (ref 0.0–0.1)
Basophils Relative: 1 %
Eosinophils Absolute: 0 10*3/uL (ref 0.0–0.5)
Eosinophils Relative: 0 %
HCT: 28.7 % — ABNORMAL LOW (ref 36.0–46.0)
Hemoglobin: 9.3 g/dL — ABNORMAL LOW (ref 12.0–15.0)
Immature Granulocytes: 3 %
Lymphocytes Relative: 72 %
Lymphs Abs: 1.2 10*3/uL (ref 0.7–4.0)
MCH: 31 pg (ref 26.0–34.0)
MCHC: 32.4 g/dL (ref 30.0–36.0)
MCV: 95.7 fL (ref 80.0–100.0)
Monocytes Absolute: 0.2 10*3/uL (ref 0.1–1.0)
Monocytes Relative: 12 %
Neutro Abs: 0.2 10*3/uL — CL (ref 1.7–7.7)
Neutrophils Relative %: 12 %
Platelet Count: 114 10*3/uL — ABNORMAL LOW (ref 150–400)
RBC: 3 MIL/uL — ABNORMAL LOW (ref 3.87–5.11)
RDW: 20.2 % — ABNORMAL HIGH (ref 11.5–15.5)
WBC Count: 1.6 10*3/uL — ABNORMAL LOW (ref 4.0–10.5)
nRBC: 0 % (ref 0.0–0.2)

## 2023-04-05 LAB — CMP (CANCER CENTER ONLY)
ALT: 20 U/L (ref 0–44)
AST: 19 U/L (ref 15–41)
Albumin: 3.5 g/dL (ref 3.5–5.0)
Alkaline Phosphatase: 105 U/L (ref 38–126)
Anion gap: 9 (ref 5–15)
BUN: 32 mg/dL — ABNORMAL HIGH (ref 8–23)
CO2: 26 mmol/L (ref 22–32)
Calcium: 9.5 mg/dL (ref 8.9–10.3)
Chloride: 103 mmol/L (ref 98–111)
Creatinine: 1.17 mg/dL — ABNORMAL HIGH (ref 0.44–1.00)
GFR, Estimated: 47 mL/min — ABNORMAL LOW (ref >60)
Glucose, Bld: 106 mg/dL — ABNORMAL HIGH (ref 70–99)
Potassium: 4.2 mmol/L (ref 3.5–5.1)
Sodium: 138 mmol/L (ref 135–145)
Total Bilirubin: 0.8 mg/dL (ref 0.0–1.2)
Total Protein: 7.5 g/dL (ref 6.5–8.1)

## 2023-04-05 LAB — MAGNESIUM: Magnesium: 2 mg/dL (ref 1.7–2.4)

## 2023-04-05 LAB — SAMPLE TO BLOOD BANK

## 2023-04-05 MED ORDER — HEPARIN SOD (PORK) LOCK FLUSH 100 UNIT/ML IV SOLN
500.0000 [IU] | Freq: Once | INTRAVENOUS | Status: AC
  Administered 2023-04-05: 500 [IU] via INTRAVENOUS

## 2023-04-05 MED ORDER — SODIUM CHLORIDE 0.9% FLUSH
10.0000 mL | INTRAVENOUS | Status: DC | PRN
  Administered 2023-04-05: 10 mL via INTRAVENOUS

## 2023-04-05 NOTE — Telephone Encounter (Signed)
 CRITICAL VALUE STICKER  CRITICAL VALUE: ANC= 0.2  RECEIVER (on-site recipient of call): M. , RN  DATE & TIME NOTIFIED: 04/05/23 at 0905  MESSENGER (representative from lab): Amber  MD NOTIFIED: Dr. Leonides Schanz  TIME OF NOTIFICATION: 04/05/23 @ 0908  RESPONSE:

## 2023-04-05 NOTE — Progress Notes (Signed)
 Patient does not need any blood products today. Port deaccessed. Copy of labs provided and MD aware.

## 2023-04-09 ENCOUNTER — Telehealth: Payer: Self-pay | Admitting: Hematology and Oncology

## 2023-04-09 ENCOUNTER — Inpatient Hospital Stay: Payer: Medicare Other

## 2023-04-09 ENCOUNTER — Inpatient Hospital Stay: Payer: Medicare Other | Attending: Hematology and Oncology

## 2023-04-09 DIAGNOSIS — R509 Fever, unspecified: Secondary | ICD-10-CM | POA: Insufficient documentation

## 2023-04-09 DIAGNOSIS — C9200 Acute myeloblastic leukemia, not having achieved remission: Secondary | ICD-10-CM | POA: Diagnosis not present

## 2023-04-09 DIAGNOSIS — R112 Nausea with vomiting, unspecified: Secondary | ICD-10-CM | POA: Insufficient documentation

## 2023-04-09 DIAGNOSIS — D709 Neutropenia, unspecified: Secondary | ICD-10-CM | POA: Insufficient documentation

## 2023-04-09 LAB — CMP (CANCER CENTER ONLY)
ALT: 20 U/L (ref 0–44)
AST: 19 U/L (ref 15–41)
Albumin: 3.5 g/dL (ref 3.5–5.0)
Alkaline Phosphatase: 104 U/L (ref 38–126)
Anion gap: 9 (ref 5–15)
BUN: 31 mg/dL — ABNORMAL HIGH (ref 8–23)
CO2: 26 mmol/L (ref 22–32)
Calcium: 9.5 mg/dL (ref 8.9–10.3)
Chloride: 102 mmol/L (ref 98–111)
Creatinine: 0.88 mg/dL (ref 0.44–1.00)
GFR, Estimated: >60 mL/min (ref >60)
Glucose, Bld: 119 mg/dL — ABNORMAL HIGH (ref 70–99)
Potassium: 4 mmol/L (ref 3.5–5.1)
Sodium: 137 mmol/L (ref 135–145)
Total Bilirubin: 0.6 mg/dL (ref 0.0–1.2)
Total Protein: 7.3 g/dL (ref 6.5–8.1)

## 2023-04-09 LAB — CBC WITH DIFFERENTIAL (CANCER CENTER ONLY)
Abs Immature Granulocytes: 0.03 10*3/uL (ref 0.00–0.07)
Basophils Absolute: 0 10*3/uL (ref 0.0–0.1)
Basophils Relative: 0 %
Eosinophils Absolute: 0 10*3/uL (ref 0.0–0.5)
Eosinophils Relative: 0 %
HCT: 28.1 % — ABNORMAL LOW (ref 36.0–46.0)
Hemoglobin: 9.1 g/dL — ABNORMAL LOW (ref 12.0–15.0)
Immature Granulocytes: 3 %
Lymphocytes Relative: 69 %
Lymphs Abs: 0.8 10*3/uL (ref 0.7–4.0)
MCH: 31.6 pg (ref 26.0–34.0)
MCHC: 32.4 g/dL (ref 30.0–36.0)
MCV: 97.6 fL (ref 80.0–100.0)
Monocytes Absolute: 0.2 10*3/uL (ref 0.1–1.0)
Monocytes Relative: 15 %
Neutro Abs: 0.1 10*3/uL — CL (ref 1.7–7.7)
Neutrophils Relative %: 13 %
Platelet Count: 109 10*3/uL — ABNORMAL LOW (ref 150–400)
RBC: 2.88 MIL/uL — ABNORMAL LOW (ref 3.87–5.11)
RDW: 20.3 % — ABNORMAL HIGH (ref 11.5–15.5)
WBC Count: 1.1 10*3/uL — ABNORMAL LOW (ref 4.0–10.5)
nRBC: 0 % (ref 0.0–0.2)

## 2023-04-09 LAB — MAGNESIUM: Magnesium: 1.7 mg/dL (ref 1.7–2.4)

## 2023-04-09 LAB — SAMPLE TO BLOOD BANK

## 2023-04-09 MED ORDER — SODIUM CHLORIDE 0.9% FLUSH
10.0000 mL | INTRAVENOUS | Status: DC | PRN
  Administered 2023-04-09: 10 mL via INTRAVENOUS

## 2023-04-09 MED ORDER — HEPARIN SOD (PORK) LOCK FLUSH 100 UNIT/ML IV SOLN
500.0000 [IU] | Freq: Once | INTRAVENOUS | Status: AC
  Administered 2023-04-09: 500 [IU] via INTRAVENOUS

## 2023-04-09 NOTE — Progress Notes (Signed)
 CRITICAL VALUE STICKER  CRITICAL VALUE: ANC 0.1  RECEIVER (on-site recipient of call): Morrie Sheldon   DATE & TIME NOTIFIED: 04/09/2023 @ 0845  MESSENGER (representative from lab): Amber  MD NOTIFIED: Dr. Beatris Ship  TIME OF NOTIFICATION: [phone]  RESPONSE:

## 2023-04-09 NOTE — Progress Notes (Signed)
 Hgb 9.1, Plt 109. Per MD notes, pt does not meet transfusion requirements. Pt given copy of labs and calendar. Endorses understanding. Port locked with heparin and deaccessed. Pt ambulated independently to lobby for d/c.

## 2023-04-12 ENCOUNTER — Telehealth: Payer: Self-pay | Admitting: *Deleted

## 2023-04-12 ENCOUNTER — Inpatient Hospital Stay: Payer: Medicare Other

## 2023-04-12 DIAGNOSIS — C9200 Acute myeloblastic leukemia, not having achieved remission: Secondary | ICD-10-CM

## 2023-04-12 DIAGNOSIS — R509 Fever, unspecified: Secondary | ICD-10-CM | POA: Diagnosis not present

## 2023-04-12 DIAGNOSIS — R112 Nausea with vomiting, unspecified: Secondary | ICD-10-CM | POA: Diagnosis not present

## 2023-04-12 DIAGNOSIS — D709 Neutropenia, unspecified: Secondary | ICD-10-CM | POA: Diagnosis not present

## 2023-04-12 LAB — CBC WITH DIFFERENTIAL (CANCER CENTER ONLY)
Abs Immature Granulocytes: 0.04 10*3/uL (ref 0.00–0.07)
Basophils Absolute: 0 10*3/uL (ref 0.0–0.1)
Basophils Relative: 0 %
Eosinophils Absolute: 0 10*3/uL (ref 0.0–0.5)
Eosinophils Relative: 0 %
HCT: 25.8 % — ABNORMAL LOW (ref 36.0–46.0)
Hemoglobin: 8.5 g/dL — ABNORMAL LOW (ref 12.0–15.0)
Immature Granulocytes: 4 %
Lymphocytes Relative: 66 %
Lymphs Abs: 0.8 10*3/uL (ref 0.7–4.0)
MCH: 31.5 pg (ref 26.0–34.0)
MCHC: 32.9 g/dL (ref 30.0–36.0)
MCV: 95.6 fL (ref 80.0–100.0)
Monocytes Absolute: 0.2 10*3/uL (ref 0.1–1.0)
Monocytes Relative: 17 %
Neutro Abs: 0.2 10*3/uL — CL (ref 1.7–7.7)
Neutrophils Relative %: 13 %
Platelet Count: 98 10*3/uL — ABNORMAL LOW (ref 150–400)
RBC: 2.7 MIL/uL — ABNORMAL LOW (ref 3.87–5.11)
RDW: 20.4 % — ABNORMAL HIGH (ref 11.5–15.5)
WBC Count: 1.1 10*3/uL — ABNORMAL LOW (ref 4.0–10.5)
nRBC: 0 % (ref 0.0–0.2)

## 2023-04-12 LAB — CMP (CANCER CENTER ONLY)
ALT: 20 U/L (ref 0–44)
AST: 21 U/L (ref 15–41)
Albumin: 3.4 g/dL — ABNORMAL LOW (ref 3.5–5.0)
Alkaline Phosphatase: 99 U/L (ref 38–126)
Anion gap: 9 (ref 5–15)
BUN: 28 mg/dL — ABNORMAL HIGH (ref 8–23)
CO2: 25 mmol/L (ref 22–32)
Calcium: 9 mg/dL (ref 8.9–10.3)
Chloride: 102 mmol/L (ref 98–111)
Creatinine: 0.92 mg/dL (ref 0.44–1.00)
GFR, Estimated: >60 mL/min (ref >60)
Glucose, Bld: 120 mg/dL — ABNORMAL HIGH (ref 70–99)
Potassium: 4 mmol/L (ref 3.5–5.1)
Sodium: 136 mmol/L (ref 135–145)
Total Bilirubin: 0.7 mg/dL (ref 0.0–1.2)
Total Protein: 7.1 g/dL (ref 6.5–8.1)

## 2023-04-12 LAB — MAGNESIUM: Magnesium: 1.7 mg/dL (ref 1.7–2.4)

## 2023-04-12 LAB — SAMPLE TO BLOOD BANK

## 2023-04-12 MED ORDER — SODIUM CHLORIDE 0.9% FLUSH
10.0000 mL | INTRAVENOUS | Status: DC | PRN
  Administered 2023-04-12: 10 mL via INTRAVENOUS

## 2023-04-12 MED ORDER — HEPARIN SOD (PORK) LOCK FLUSH 100 UNIT/ML IV SOLN
500.0000 [IU] | Freq: Once | INTRAVENOUS | Status: AC
  Administered 2023-04-12: 500 [IU] via INTRAVENOUS

## 2023-04-12 NOTE — Telephone Encounter (Signed)
 TCT patient regarding today's CBC results.  She is at home. Spoke with her. Advised that her HGB was 8.5.  Asked pt if she felt that needed a blood transfusion as her results are within the parameters set by [facility] oncologist, Dr. Lowella Bandy, to get a transfusion. Pt states she would prefer not to come back again and will wait til her next lab on 04/16/23. Reminded pt that she remains neutropenic and to continue with her precautions. She voiced understanding. She sees Dr. Lowella Bandy next week for her monthly appt there.

## 2023-04-16 ENCOUNTER — Inpatient Hospital Stay

## 2023-04-16 ENCOUNTER — Inpatient Hospital Stay: Payer: Medicare Other

## 2023-04-16 ENCOUNTER — Other Ambulatory Visit: Payer: Self-pay | Admitting: *Deleted

## 2023-04-16 ENCOUNTER — Inpatient Hospital Stay (HOSPITAL_BASED_OUTPATIENT_CLINIC_OR_DEPARTMENT_OTHER): Admitting: Physician Assistant

## 2023-04-16 VITALS — BP 113/53 | HR 57 | Temp 98.8°F | Resp 18

## 2023-04-16 DIAGNOSIS — R509 Fever, unspecified: Secondary | ICD-10-CM | POA: Diagnosis not present

## 2023-04-16 DIAGNOSIS — D709 Neutropenia, unspecified: Secondary | ICD-10-CM | POA: Diagnosis not present

## 2023-04-16 DIAGNOSIS — C9200 Acute myeloblastic leukemia, not having achieved remission: Secondary | ICD-10-CM | POA: Diagnosis not present

## 2023-04-16 DIAGNOSIS — Z95828 Presence of other vascular implants and grafts: Secondary | ICD-10-CM

## 2023-04-16 DIAGNOSIS — R112 Nausea with vomiting, unspecified: Secondary | ICD-10-CM

## 2023-04-16 LAB — CBC WITH DIFFERENTIAL (CANCER CENTER ONLY)
Abs Immature Granulocytes: 0.04 10*3/uL (ref 0.00–0.07)
Basophils Absolute: 0 10*3/uL (ref 0.0–0.1)
Basophils Relative: 0 %
Eosinophils Absolute: 0 10*3/uL (ref 0.0–0.5)
Eosinophils Relative: 0 %
HCT: 25 % — ABNORMAL LOW (ref 36.0–46.0)
Hemoglobin: 8.1 g/dL — ABNORMAL LOW (ref 12.0–15.0)
Immature Granulocytes: 4 %
Lymphocytes Relative: 73 %
Lymphs Abs: 0.8 10*3/uL (ref 0.7–4.0)
MCH: 31.5 pg (ref 26.0–34.0)
MCHC: 32.4 g/dL (ref 30.0–36.0)
MCV: 97.3 fL (ref 80.0–100.0)
Monocytes Absolute: 0.1 10*3/uL (ref 0.1–1.0)
Monocytes Relative: 7 %
Neutro Abs: 0.2 10*3/uL — CL (ref 1.7–7.7)
Neutrophils Relative %: 16 %
Platelet Count: 127 10*3/uL — ABNORMAL LOW (ref 150–400)
RBC: 2.57 MIL/uL — ABNORMAL LOW (ref 3.87–5.11)
RDW: 20.8 % — ABNORMAL HIGH (ref 11.5–15.5)
WBC Count: 1 10*3/uL — ABNORMAL LOW (ref 4.0–10.5)
nRBC: 2 % — ABNORMAL HIGH (ref 0.0–0.2)

## 2023-04-16 LAB — SAMPLE TO BLOOD BANK

## 2023-04-16 LAB — CMP (CANCER CENTER ONLY)
ALT: 21 U/L (ref 0–44)
AST: 19 U/L (ref 15–41)
Albumin: 3.5 g/dL (ref 3.5–5.0)
Alkaline Phosphatase: 102 U/L (ref 38–126)
Anion gap: 8 (ref 5–15)
BUN: 46 mg/dL — ABNORMAL HIGH (ref 8–23)
CO2: 26 mmol/L (ref 22–32)
Calcium: 9 mg/dL (ref 8.9–10.3)
Chloride: 102 mmol/L (ref 98–111)
Creatinine: 0.91 mg/dL (ref 0.44–1.00)
GFR, Estimated: >60 mL/min (ref >60)
Glucose, Bld: 156 mg/dL — ABNORMAL HIGH (ref 70–99)
Potassium: 4.2 mmol/L (ref 3.5–5.1)
Sodium: 136 mmol/L (ref 135–145)
Total Bilirubin: 0.5 mg/dL (ref 0.0–1.2)
Total Protein: 7.2 g/dL (ref 6.5–8.1)

## 2023-04-16 LAB — RESP PANEL BY RT-PCR (RSV, FLU A&B, COVID)  RVPGX2
Influenza A by PCR: NEGATIVE
Influenza B by PCR: NEGATIVE
Resp Syncytial Virus by PCR: NEGATIVE
SARS Coronavirus 2 by RT PCR: NEGATIVE

## 2023-04-16 LAB — URINALYSIS, COMPLETE (UACMP) WITH MICROSCOPIC
Bilirubin Urine: NEGATIVE
Glucose, UA: NEGATIVE mg/dL
Hgb urine dipstick: NEGATIVE
Ketones, ur: NEGATIVE mg/dL
Leukocytes,Ua: NEGATIVE
Nitrite: NEGATIVE
Protein, ur: NEGATIVE mg/dL
Specific Gravity, Urine: 1.021 (ref 1.005–1.030)
pH: 5 (ref 5.0–8.0)

## 2023-04-16 LAB — PREPARE RBC (CROSSMATCH)

## 2023-04-16 LAB — MAGNESIUM: Magnesium: 1.8 mg/dL (ref 1.7–2.4)

## 2023-04-16 MED ORDER — HEPARIN SOD (PORK) LOCK FLUSH 100 UNIT/ML IV SOLN
500.0000 [IU] | Freq: Every day | INTRAVENOUS | Status: AC | PRN
  Administered 2023-04-16: 500 [IU]

## 2023-04-16 MED ORDER — SODIUM CHLORIDE 0.9% FLUSH
10.0000 mL | INTRAVENOUS | Status: DC | PRN
  Administered 2023-04-16: 10 mL via INTRAVENOUS

## 2023-04-16 MED ORDER — ACETAMINOPHEN 325 MG PO TABS
650.0000 mg | ORAL_TABLET | Freq: Once | ORAL | Status: AC
  Administered 2023-04-16: 650 mg via ORAL
  Filled 2023-04-16: qty 2

## 2023-04-16 MED ORDER — SODIUM CHLORIDE 0.9% FLUSH
10.0000 mL | INTRAVENOUS | Status: AC | PRN
Start: 2023-04-16 — End: 2023-04-16
  Administered 2023-04-16: 10 mL

## 2023-04-16 MED ORDER — SODIUM CHLORIDE 0.9% IV SOLUTION
250.0000 mL | INTRAVENOUS | Status: DC
  Administered 2023-04-16: 100 mL via INTRAVENOUS

## 2023-04-16 NOTE — Progress Notes (Signed)
 Patient came in today for her blood products- her hemoglbin is 8.1 and platelets 127. Due for 1 unit of blood. Patient has been sick over the weekend- threw up several times, sleeping a lot, very fatigued, not eating a lot. Information transferred to Georga Kaufmann, PA and Binnie Rail, Charity fundraiser. Dr. Leonides Schanz not present today. Binnie Rail to come see patient in infusion.   VSS- BP (!) 116/45 (BP Location: Right Arm, Patient Position: Sitting)   Pulse 68   Temp 99.3 F (37.4 C) (Oral)   Resp 18   LMP 01/19/1992 (Exact Date)   SpO2 99%   Temperature is 99.3 F.  Report transferred to Daphane Shepherd, PA symptom management. Jae Dire came to assess patient in infusion. UA, Covid/RSV/Flu swab and blood cultures to be done.   PAC re-dressed with antibiotic disk.   When it was difficult to get a room at [facility]- patient decided to go home and monitor her own temperatures- sister present to help- and is aware she needs to come back to the Emergency department if she runs another temperature.

## 2023-04-16 NOTE — Progress Notes (Signed)
 Symptom Management Consult Note [facility]    Patient Care Team: [name], MD as PCP - General (Family Medicine) Jodelle Red, MD as PCP - Cardiology (Cardiology)    Name / MRN / DOB: [name]  [id number]  1941/01/19   Date of visit: 04/16/2023   Chief Complaint/Reason for visit: fatigue, nausea and vomiting   ASSESSMENT & PLAN: Patient is a 83 y.o. female with oncologic history of Acute Myeloid Leukemia followed by Dr. Leonides Schanz.  I have viewed most recent oncology note and lab work.    #Acute Myeloid Leukemia  - Next appointment with oncologist here at [facility] is 04/26/23. Co managed with Dr. Lowella Bandy at [facility] - Here for scheduled blood transfusion today, 1 unit PRBCs.   #Neutropenic  -CBC today with WBC 1.0, ANC 0.2. Stable per chart review. - Patient is not toxic appearing, clear lung exam, no abdominal tenderness. VSS - Patient has been monitoring temperature at home and t-max is 99 without antipyretic.  - Fever work up initiated as she is severely immunocompromised and symptomatic although possible source is not clear. UA is negative for infection, culture sent. Urine culture sent. Covid, flu, RSV tests are negative. Blood cultures collected. - RN contacted Dr. Crecencio Mc office to report symptom and recommendation was for admission for close monitoring. Attempted direct admission and unfortunately there are no beds available per bed placement, with estimated wait times of 10 hours. I discussed this with patient and recommended further work up with the ED for her symptoms. Patient is adamant she can monitor symptoms at home. Lengthy discussion had on the risks of not having additional work up at this time. She will follow up with Dr. Lowella Bandy on 04/19/23.  Strict ED precautions discussed should symptoms worsen.   Heme/Onc History: Oncology History   No history exists.      Interval history-: Discussed the use of AI scribe software for clinical  note transcription with the patient, who gave verbal consent to proceed.   [name] is a 83 y.o. female with oncologic history of AML presenting to [facility] today with chief complaint of fatigue and nausea with vomiting. Patient is accompanied by family member who provides additional history.  She has been experiencing extreme fatigue x2 days. She has been sleeping most of the day and requires assistance from her sister for basic activities such as showering and dressing which is new. No current nausea, abdominal pain, urinary symptoms, or body aches. She reports a low-grade fever reaching 67F over the weekend. She has not taken any OTC medications including Tylenol.  She has been vomiting over the last couple of days, including after taking her medication with yogurt, which is her usual morning routine. She has been able to keep down Boost nutritional drinks, consuming several bottles over the past few days, but has been unable to eat solid food due to a general feeling of being unable to handle it, rather than nausea. She recalls a similar episode of vomiting a couple of weeks ago after eating a hamburger, which she found unpalatable and subsequently vomited. Denies history of frequent UTI. No dysuria or frequency. Denies cough, congestion, arthralgias, myalgias.  She lives alone with her dog and is usually independent, but has recently required assistance from her sister due to her current condition. Her brother and his family recently had the flu, but she has not been in contact with them. Sister denies any mental status changes.  ROS  All other  systems are reviewed and are negative for acute change except as noted in the HPI.    Allergies  Allergen Reactions   Ambien [Zolpidem]     Intolerant.   Codeine Nausea And Vomiting   Dust Mite Extract Cough   Grass Pollen(K-O-R-T-Swt Vern) Cough   Hydrocodone Nausea Only   Statins     Muscle pain   Sulfa Antibiotics Nausea And Vomiting    Zofran [Ondansetron]     Inc in anxiety and doesn't help with nausea.       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   Thyroid disease      Past Surgical History:  Procedure Laterality Date   DILATION AND CURETTAGE OF UTERUS     menorrhagia   KNEE ARTHROSCOPY Right 10/06   TUBAL LIGATION      Social History   Socioeconomic History   Marital status: Divorced    Spouse name: Not on file   Number of children: 0   Years of education: Not on file   Highest education level: Not on file  Occupational History    Employer: [employer]  Tobacco Use   Smoking status: Former   Smokeless tobacco: Never  Vaping Use   Vaping status: Never Used  Substance and Sexual Activity   Alcohol use: Yes    Comment: 1 every  few months   Drug use: No   Sexual activity: Not Currently    Partners: Male    Birth control/protection: Post-menopausal  Other Topics Concern   Not on file  Social History Narrative   Divorced.   High school graduate.   Previously worked at [employer] at [employer].   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   Social Drivers of Corporate investment banker Strain: Not on file  Food Insecurity: No Food Insecurity (09/15/2022)   Hunger Vital Sign    Worried About Running Out of Food in the Last Year: Never true    Ran Out of Food in the Last Year: Never true  Transportation Needs: No Transportation Needs (09/15/2022)   PRAPARE - Administrator, Civil Service (Medical): No    Lack of Transportation (Non-Medical): No  Physical Activity: Not on file  Stress: Not on file  Social Connections: Not on file  Intimate Partner Violence: Not on file    Family History  Problem Relation Age of Onset   Heart disease Mother    Diabetes Father    Heart  failure Father    COPD Father    Hyperlipidemia Sister    Diabetes Brother    Colon cancer Neg Hx    Breast cancer Neg Hx      Current Outpatient Medications:    acyclovir (ZOVIRAX) 400 MG tablet, Take 400 mg by mouth 2 (two) times daily., Disp: , Rfl:    ALPRAZolam (XANAX) 0.25 MG tablet, TAKE 1 TABLET (0.25 MG TOTAL) BY MOUTH DAILY AS NEEDED., Disp: 30 tablet, Rfl: 3   Cyanocobalamin (VITAMIN B-12 PO), Take 1,000 mcg by mouth daily., Disp: , Rfl:    furosemide (LASIX) 20 MG tablet, Take 20 mg by mouth daily as needed for fluid. Prn for weight gain >5 lbs, Disp: ,  Rfl:    levofloxacin (LEVAQUIN) 250 MG tablet, Take 250 mg by mouth daily., Disp: , Rfl:    levothyroxine (SYNTHROID) 50 MCG tablet, TAKE 1 TABLET (50 MCG TOTAL) BY MOUTH DAILY., Disp: 90 tablet, Rfl: 3   losartan (COZAAR) 25 MG tablet, Take 1 tablet (25 mg total) by mouth daily., Disp: , Rfl:    metoprolol succinate (TOPROL-XL) 25 MG 24 hr tablet, Take 1 tablet by mouth daily., Disp: , Rfl:    polycarbophil (FIBERCON) 625 MG tablet, Take 625 mg by mouth daily., Disp: , Rfl:    posaconazole (NOXAFIL) 100 MG TBEC delayed-release tablet, Take 300 mg by mouth daily., Disp: , Rfl:    prochlorperazine (COMPAZINE) 5 MG tablet, Take by mouth., Disp: , Rfl:    spironolactone (ALDACTONE) 25 MG tablet, Take by mouth., Disp: , Rfl:    zinc sulfate 220 (50 Zn) MG capsule, Take 220 mg by mouth daily., Disp: , Rfl:   PHYSICAL EXAM: ECOG FS:2 - Symptomatic, <50% confined to bed    Vitals:   04/16/23 1100  BP: (!) 113/53  Pulse: (!) 57  Resp: 18  Temp: 98.8 F (37.1 C)  TempSrc: Oral  SpO2: 100%   Physical Exam Vitals and nursing note reviewed.  Constitutional:      Appearance: She is not ill-appearing or toxic-appearing.     Comments: Frail appearing female  HENT:     Head: Normocephalic.  Eyes:     Conjunctiva/sclera: Conjunctivae normal.  Cardiovascular:     Rate and Rhythm: Regular rhythm. Bradycardia present.      Pulses: Normal pulses.     Heart sounds: Normal heart sounds.  Pulmonary:     Effort: Pulmonary effort is normal.     Breath sounds: Normal breath sounds.  Chest:     Comments: Port a cath without surrounding erythema. No tenderness. No signs of infection. Abdominal:     General: Bowel sounds are normal. There is no distension.     Tenderness: There is no abdominal tenderness. There is no guarding.  Musculoskeletal:     Cervical back: Normal range of motion.     Right lower leg: No edema.     Left lower leg: No edema.  Skin:    General: Skin is warm and dry.  Neurological:     Mental Status: She is alert.        LABORATORY DATA: I have reviewed the data as listed    Latest Ref Rng & Units 04/16/2023    7:36 AM 04/12/2023    7:39 AM 04/09/2023    7:41 AM  CBC  WBC 4.0 - 10.5 K/uL 1.0  1.1  1.1   Hemoglobin 12.0 - 15.0 g/dL 8.1  8.5  9.1   Hematocrit 36.0 - 46.0 % 25.0  25.8  28.1   Platelets 150 - 400 K/uL 127  98  109         Latest Ref Rng & Units 04/16/2023    7:36 AM 04/12/2023    7:39 AM 04/09/2023    7:41 AM  CMP  Glucose 70 - 99 mg/dL 604  540  981   BUN 8 - 23 mg/dL 46  28  31   Creatinine 0.44 - 1.00 mg/dL 1.91  4.78  2.95   Sodium 135 - 145 mmol/L 136  136  137   Potassium 3.5 - 5.1 mmol/L 4.2  4.0  4.0   Chloride 98 - 111 mmol/L 102  102  102   CO2  22 - 32 mmol/L 26  25  26    Calcium 8.9 - 10.3 mg/dL 9.0  9.0  9.5   Total Protein 6.5 - 8.1 g/dL 7.2  7.1  7.3   Total Bilirubin 0.0 - 1.2 mg/dL 0.5  0.7  0.6   Alkaline Phos 38 - 126 U/L 102  99  104   AST 15 - 41 U/L 19  21  19    ALT 0 - 44 U/L 21  20  20         RADIOGRAPHIC STUDIES (from last 24 hours if applicable) I have personally reviewed the radiological images as listed and agreed with the findings in the report. No results found.      Visit Diagnosis: 1. Port-A-Cath in place   2. Acute myeloid leukemia not having achieved remission (HCC)   3. Nausea and vomiting, unspecified vomiting type       No orders of the defined types were placed in this encounter.   All questions were answered. The patient knows to call the clinic with any problems, questions or concerns. No barriers to learning was detected.  A total of more than 30 minutes were spent on this encounter with face-to-face time and non-face-to-face time, including preparing to see the patient, ordering tests and/or medications, counseling the patient and coordination of care as outlined above.    Thank you for allowing me to participate in the care of this patient.    Shanon Ace, PA-C Department of Hematology/Oncology [facility] at [facility] Phone: [phone]  [phone]    04/16/2023 9:11 PM

## 2023-04-16 NOTE — Patient Instructions (Signed)

## 2023-04-17 LAB — TYPE AND SCREEN
ABO/RH(D): A POS
Antibody Screen: NEGATIVE
Unit division: 0

## 2023-04-17 LAB — BPAM RBC
Blood Product Expiration Date: 202503202359
ISSUE DATE / TIME: 202503100938
Unit Type and Rh: 6200

## 2023-04-17 LAB — URINE CULTURE

## 2023-04-18 ENCOUNTER — Other Ambulatory Visit: Payer: Self-pay | Admitting: Hematology and Oncology

## 2023-04-19 ENCOUNTER — Inpatient Hospital Stay: Payer: Medicare Other

## 2023-04-19 DIAGNOSIS — R5381 Other malaise: Secondary | ICD-10-CM | POA: Diagnosis not present

## 2023-04-19 DIAGNOSIS — I5032 Chronic diastolic (congestive) heart failure: Secondary | ICD-10-CM | POA: Diagnosis not present

## 2023-04-19 DIAGNOSIS — I491 Atrial premature depolarization: Secondary | ICD-10-CM | POA: Diagnosis not present

## 2023-04-19 DIAGNOSIS — C9200 Acute myeloblastic leukemia, not having achieved remission: Secondary | ICD-10-CM | POA: Diagnosis not present

## 2023-04-19 DIAGNOSIS — I499 Cardiac arrhythmia, unspecified: Secondary | ICD-10-CM | POA: Diagnosis not present

## 2023-04-19 DIAGNOSIS — I110 Hypertensive heart disease with heart failure: Secondary | ICD-10-CM | POA: Diagnosis not present

## 2023-04-19 DIAGNOSIS — E039 Hypothyroidism, unspecified: Secondary | ICD-10-CM | POA: Diagnosis not present

## 2023-04-19 DIAGNOSIS — R918 Other nonspecific abnormal finding of lung field: Secondary | ICD-10-CM | POA: Diagnosis not present

## 2023-04-19 DIAGNOSIS — R5081 Fever presenting with conditions classified elsewhere: Secondary | ICD-10-CM | POA: Diagnosis not present

## 2023-04-19 DIAGNOSIS — D709 Neutropenia, unspecified: Secondary | ICD-10-CM | POA: Diagnosis not present

## 2023-04-19 DIAGNOSIS — I272 Pulmonary hypertension, unspecified: Secondary | ICD-10-CM | POA: Diagnosis not present

## 2023-04-19 DIAGNOSIS — Z79899 Other long term (current) drug therapy: Secondary | ICD-10-CM | POA: Diagnosis not present

## 2023-04-19 DIAGNOSIS — I493 Ventricular premature depolarization: Secondary | ICD-10-CM | POA: Diagnosis not present

## 2023-04-19 DIAGNOSIS — I5020 Unspecified systolic (congestive) heart failure: Secondary | ICD-10-CM | POA: Diagnosis not present

## 2023-04-19 DIAGNOSIS — R251 Tremor, unspecified: Secondary | ICD-10-CM | POA: Diagnosis not present

## 2023-04-19 DIAGNOSIS — E785 Hyperlipidemia, unspecified: Secondary | ICD-10-CM | POA: Diagnosis not present

## 2023-04-19 DIAGNOSIS — D61818 Other pancytopenia: Secondary | ICD-10-CM | POA: Diagnosis not present

## 2023-04-19 DIAGNOSIS — J90 Pleural effusion, not elsewhere classified: Secondary | ICD-10-CM | POA: Diagnosis not present

## 2023-04-20 DIAGNOSIS — I499 Cardiac arrhythmia, unspecified: Secondary | ICD-10-CM | POA: Diagnosis not present

## 2023-04-20 DIAGNOSIS — D709 Neutropenia, unspecified: Secondary | ICD-10-CM | POA: Diagnosis not present

## 2023-04-20 DIAGNOSIS — I491 Atrial premature depolarization: Secondary | ICD-10-CM | POA: Diagnosis not present

## 2023-04-20 DIAGNOSIS — I493 Ventricular premature depolarization: Secondary | ICD-10-CM | POA: Diagnosis not present

## 2023-04-20 DIAGNOSIS — R5081 Fever presenting with conditions classified elsewhere: Secondary | ICD-10-CM | POA: Diagnosis not present

## 2023-04-21 DIAGNOSIS — D709 Neutropenia, unspecified: Secondary | ICD-10-CM | POA: Diagnosis not present

## 2023-04-21 DIAGNOSIS — R5081 Fever presenting with conditions classified elsewhere: Secondary | ICD-10-CM | POA: Diagnosis not present

## 2023-04-21 LAB — CULTURE, BLOOD (ROUTINE X 2)
Culture: NO GROWTH
Culture: NO GROWTH
Special Requests: ADEQUATE
Special Requests: ADEQUATE

## 2023-04-22 DIAGNOSIS — D709 Neutropenia, unspecified: Secondary | ICD-10-CM | POA: Diagnosis not present

## 2023-04-22 DIAGNOSIS — R5081 Fever presenting with conditions classified elsewhere: Secondary | ICD-10-CM | POA: Diagnosis not present

## 2023-04-23 ENCOUNTER — Inpatient Hospital Stay: Payer: Medicare Other

## 2023-04-23 DIAGNOSIS — R5081 Fever presenting with conditions classified elsewhere: Secondary | ICD-10-CM | POA: Diagnosis not present

## 2023-04-23 DIAGNOSIS — D709 Neutropenia, unspecified: Secondary | ICD-10-CM | POA: Diagnosis not present

## 2023-04-24 ENCOUNTER — Telehealth: Payer: Self-pay

## 2023-04-24 ENCOUNTER — Other Ambulatory Visit (HOSPITAL_BASED_OUTPATIENT_CLINIC_OR_DEPARTMENT_OTHER): Payer: Medicare Other

## 2023-04-24 ENCOUNTER — Telehealth: Payer: Self-pay | Admitting: *Deleted

## 2023-04-24 DIAGNOSIS — D709 Neutropenia, unspecified: Secondary | ICD-10-CM | POA: Diagnosis not present

## 2023-04-24 DIAGNOSIS — R918 Other nonspecific abnormal finding of lung field: Secondary | ICD-10-CM | POA: Diagnosis not present

## 2023-04-24 DIAGNOSIS — R5081 Fever presenting with conditions classified elsewhere: Secondary | ICD-10-CM | POA: Diagnosis not present

## 2023-04-24 NOTE — Telephone Encounter (Signed)
 I just saw her intake note from the hospital.  I appreciate the update. I will await her inpatient notes and I hope she feels better soon.  Thanks.

## 2023-04-24 NOTE — Telephone Encounter (Signed)
 Copied from CRM [id number]. Topic: General - Other >> Apr 24, 2023  9:42 AM Fredrich Romans wrote: Reason for CRM: Patient called in and just wanted to let Dr Para March know that she admitted into the hospital with pneumonia

## 2023-04-24 NOTE — Telephone Encounter (Signed)
 Patient called to report she is in the hospital with pneumonia at [facility]. She has been there since 3/13 and is expected to remain in the hospital the next few days. She cancelled appts on Thursday and would like a return call when [place], RN returns.

## 2023-04-25 ENCOUNTER — Telehealth: Payer: Self-pay | Admitting: *Deleted

## 2023-04-25 DIAGNOSIS — R5081 Fever presenting with conditions classified elsewhere: Secondary | ICD-10-CM | POA: Diagnosis not present

## 2023-04-25 DIAGNOSIS — J90 Pleural effusion, not elsewhere classified: Secondary | ICD-10-CM | POA: Diagnosis not present

## 2023-04-25 DIAGNOSIS — D709 Neutropenia, unspecified: Secondary | ICD-10-CM | POA: Diagnosis not present

## 2023-04-25 DIAGNOSIS — C9200 Acute myeloblastic leukemia, not having achieved remission: Secondary | ICD-10-CM | POA: Diagnosis not present

## 2023-04-25 NOTE — Telephone Encounter (Signed)
 TCT patient to check on her as she is admitted to [facility] with pneumonia. No answer but was able to leave vm message for to call back when she is able @ [phone]

## 2023-04-26 ENCOUNTER — Inpatient Hospital Stay: Payer: Medicare Other

## 2023-04-26 ENCOUNTER — Telehealth: Payer: Self-pay | Admitting: *Deleted

## 2023-04-26 ENCOUNTER — Inpatient Hospital Stay: Payer: Medicare Other | Admitting: Hematology and Oncology

## 2023-04-26 DIAGNOSIS — C9200 Acute myeloblastic leukemia, not having achieved remission: Secondary | ICD-10-CM | POA: Diagnosis not present

## 2023-04-26 DIAGNOSIS — R5081 Fever presenting with conditions classified elsewhere: Secondary | ICD-10-CM | POA: Diagnosis not present

## 2023-04-26 DIAGNOSIS — D709 Neutropenia, unspecified: Secondary | ICD-10-CM | POA: Diagnosis not present

## 2023-04-26 NOTE — Telephone Encounter (Signed)
 Received call back from patient. She states she is still in patient @ [facility] in [place]. She has pneumonia but is feeling better on IV antibiotics. If her fever stays down for a day or more she is hopeful that she can go home by the weekend.  She wants to make sure her lab appts are still in place 2 x a week for Mondays and Thursdays. Advised that I would make sure they are in place at her usual time of 7:30 am  Asked her to call and let me know when she is officially discharged.  She also states she had a repeat bone marrow biopsy that revealed 6-7% blasts and she thinks she will be started on oral chemo per Dr. Lowella Bandy. She will let me know that as well. Dr. Leonides Schanz made aware of the above. We will get her scheduled with him as well for follow up.

## 2023-04-27 DIAGNOSIS — R5081 Fever presenting with conditions classified elsewhere: Secondary | ICD-10-CM | POA: Diagnosis not present

## 2023-04-27 DIAGNOSIS — D709 Neutropenia, unspecified: Secondary | ICD-10-CM | POA: Diagnosis not present

## 2023-04-27 DIAGNOSIS — C9200 Acute myeloblastic leukemia, not having achieved remission: Secondary | ICD-10-CM | POA: Diagnosis not present

## 2023-04-29 DIAGNOSIS — C9200 Acute myeloblastic leukemia, not having achieved remission: Secondary | ICD-10-CM | POA: Diagnosis not present

## 2023-04-29 DIAGNOSIS — D709 Neutropenia, unspecified: Secondary | ICD-10-CM | POA: Diagnosis not present

## 2023-04-29 DIAGNOSIS — R5081 Fever presenting with conditions classified elsewhere: Secondary | ICD-10-CM | POA: Diagnosis not present

## 2023-04-30 ENCOUNTER — Telehealth: Payer: Self-pay

## 2023-04-30 ENCOUNTER — Inpatient Hospital Stay

## 2023-04-30 DIAGNOSIS — D709 Neutropenia, unspecified: Secondary | ICD-10-CM | POA: Diagnosis not present

## 2023-04-30 DIAGNOSIS — C9200 Acute myeloblastic leukemia, not having achieved remission: Secondary | ICD-10-CM | POA: Diagnosis not present

## 2023-04-30 DIAGNOSIS — R509 Fever, unspecified: Secondary | ICD-10-CM | POA: Diagnosis not present

## 2023-04-30 DIAGNOSIS — R112 Nausea with vomiting, unspecified: Secondary | ICD-10-CM | POA: Diagnosis not present

## 2023-04-30 LAB — CMP (CANCER CENTER ONLY)
ALT: 33 U/L (ref 0–44)
AST: 32 U/L (ref 15–41)
Albumin: 3.2 g/dL — ABNORMAL LOW (ref 3.5–5.0)
Alkaline Phosphatase: 157 U/L — ABNORMAL HIGH (ref 38–126)
Anion gap: 8 (ref 5–15)
BUN: 20 mg/dL (ref 8–23)
CO2: 27 mmol/L (ref 22–32)
Calcium: 9.1 mg/dL (ref 8.9–10.3)
Chloride: 101 mmol/L (ref 98–111)
Creatinine: 0.85 mg/dL (ref 0.44–1.00)
GFR, Estimated: >60 mL/min (ref >60)
Glucose, Bld: 118 mg/dL — ABNORMAL HIGH (ref 70–99)
Potassium: 4.1 mmol/L (ref 3.5–5.1)
Sodium: 136 mmol/L (ref 135–145)
Total Bilirubin: 1 mg/dL (ref 0.0–1.2)
Total Protein: 7 g/dL (ref 6.5–8.1)

## 2023-04-30 LAB — CBC WITH DIFFERENTIAL (CANCER CENTER ONLY)
Abs Immature Granulocytes: 0.02 10*3/uL (ref 0.00–0.07)
Basophils Absolute: 0 10*3/uL (ref 0.0–0.1)
Basophils Relative: 0 %
Eosinophils Absolute: 0 10*3/uL (ref 0.0–0.5)
Eosinophils Relative: 0 %
HCT: 34.9 % — ABNORMAL LOW (ref 36.0–46.0)
Hemoglobin: 11.6 g/dL — ABNORMAL LOW (ref 12.0–15.0)
Immature Granulocytes: 2 %
Lymphocytes Relative: 75 %
Lymphs Abs: 0.9 10*3/uL (ref 0.7–4.0)
MCH: 30.2 pg (ref 26.0–34.0)
MCHC: 33.2 g/dL (ref 30.0–36.0)
MCV: 90.9 fL (ref 80.0–100.0)
Monocytes Absolute: 0.1 10*3/uL (ref 0.1–1.0)
Monocytes Relative: 10 %
Neutro Abs: 0.2 10*3/uL — CL (ref 1.7–7.7)
Neutrophils Relative %: 13 %
Platelet Count: 181 10*3/uL (ref 150–400)
RBC: 3.84 MIL/uL — ABNORMAL LOW (ref 3.87–5.11)
RDW: 21.2 % — ABNORMAL HIGH (ref 11.5–15.5)
WBC Count: 1.2 10*3/uL — ABNORMAL LOW (ref 4.0–10.5)
nRBC: 5.1 % — ABNORMAL HIGH (ref 0.0–0.2)

## 2023-04-30 LAB — SAMPLE TO BLOOD BANK

## 2023-04-30 LAB — MAGNESIUM: Magnesium: 1.9 mg/dL (ref 1.7–2.4)

## 2023-04-30 MED ORDER — SODIUM CHLORIDE 0.9% FLUSH
10.0000 mL | INTRAVENOUS | Status: DC | PRN
  Administered 2023-04-30: 10 mL via INTRAVENOUS

## 2023-04-30 MED ORDER — HEPARIN SOD (PORK) LOCK FLUSH 100 UNIT/ML IV SOLN
500.0000 [IU] | Freq: Once | INTRAVENOUS | Status: AC
  Administered 2023-04-30: 500 [IU] via INTRAVENOUS

## 2023-04-30 NOTE — Telephone Encounter (Signed)
 CRITICAL VALUE STICKER  CRITICAL VALUE:    ANC  0.2  RECEIVER (on-site recipient of call):  Daneil Dolin, LPN  DATE & TIME NOTIFIED: 08:58  04/30/23  MESSENGER (representative from lab):  Herbert Seta  MD NOTIFIED: Jeanie Sewer, MD  TIME OF NOTIFICATION:  09:00  RESPONSE:

## 2023-05-03 ENCOUNTER — Inpatient Hospital Stay

## 2023-05-03 ENCOUNTER — Inpatient Hospital Stay: Admitting: Hematology and Oncology

## 2023-05-03 VITALS — BP 118/59 | HR 61 | Temp 98.5°F | Resp 13 | Wt 112.2 lb

## 2023-05-03 DIAGNOSIS — R112 Nausea with vomiting, unspecified: Secondary | ICD-10-CM | POA: Diagnosis not present

## 2023-05-03 DIAGNOSIS — R509 Fever, unspecified: Secondary | ICD-10-CM | POA: Diagnosis not present

## 2023-05-03 DIAGNOSIS — Z95828 Presence of other vascular implants and grafts: Secondary | ICD-10-CM

## 2023-05-03 DIAGNOSIS — C9200 Acute myeloblastic leukemia, not having achieved remission: Secondary | ICD-10-CM

## 2023-05-03 DIAGNOSIS — D709 Neutropenia, unspecified: Secondary | ICD-10-CM | POA: Diagnosis not present

## 2023-05-03 LAB — CBC WITH DIFFERENTIAL (CANCER CENTER ONLY)
Abs Immature Granulocytes: 0.02 10*3/uL (ref 0.00–0.07)
Basophils Absolute: 0 10*3/uL (ref 0.0–0.1)
Basophils Relative: 0 %
Eosinophils Absolute: 0 10*3/uL (ref 0.0–0.5)
Eosinophils Relative: 0 %
HCT: 34 % — ABNORMAL LOW (ref 36.0–46.0)
Hemoglobin: 10.9 g/dL — ABNORMAL LOW (ref 12.0–15.0)
Immature Granulocytes: 2 %
Lymphocytes Relative: 72 %
Lymphs Abs: 0.7 10*3/uL (ref 0.7–4.0)
MCH: 29.9 pg (ref 26.0–34.0)
MCHC: 32.1 g/dL (ref 30.0–36.0)
MCV: 93.4 fL (ref 80.0–100.0)
Monocytes Absolute: 0.1 10*3/uL (ref 0.1–1.0)
Monocytes Relative: 15 %
Neutro Abs: 0.1 10*3/uL — CL (ref 1.7–7.7)
Neutrophils Relative %: 11 %
Platelet Count: 157 10*3/uL (ref 150–400)
RBC: 3.64 MIL/uL — ABNORMAL LOW (ref 3.87–5.11)
RDW: 19.9 % — ABNORMAL HIGH (ref 11.5–15.5)
WBC Count: 1 10*3/uL — ABNORMAL LOW (ref 4.0–10.5)
nRBC: 2.1 % — ABNORMAL HIGH (ref 0.0–0.2)

## 2023-05-03 LAB — CMP (CANCER CENTER ONLY)
ALT: 40 U/L (ref 0–44)
AST: 41 U/L (ref 15–41)
Albumin: 3.2 g/dL — ABNORMAL LOW (ref 3.5–5.0)
Alkaline Phosphatase: 179 U/L — ABNORMAL HIGH (ref 38–126)
Anion gap: 9 (ref 5–15)
BUN: 25 mg/dL — ABNORMAL HIGH (ref 8–23)
CO2: 28 mmol/L (ref 22–32)
Calcium: 9.2 mg/dL (ref 8.9–10.3)
Chloride: 102 mmol/L (ref 98–111)
Creatinine: 0.89 mg/dL (ref 0.44–1.00)
GFR, Estimated: >60 mL/min (ref >60)
Glucose, Bld: 133 mg/dL — ABNORMAL HIGH (ref 70–99)
Potassium: 4.1 mmol/L (ref 3.5–5.1)
Sodium: 139 mmol/L (ref 135–145)
Total Bilirubin: 0.9 mg/dL (ref 0.0–1.2)
Total Protein: 7 g/dL (ref 6.5–8.1)

## 2023-05-03 LAB — SAMPLE TO BLOOD BANK

## 2023-05-03 LAB — MAGNESIUM: Magnesium: 1.9 mg/dL (ref 1.7–2.4)

## 2023-05-03 MED ORDER — SODIUM CHLORIDE 0.9% FLUSH
10.0000 mL | INTRAVENOUS | Status: DC | PRN
  Administered 2023-05-03: 10 mL via INTRAVENOUS

## 2023-05-03 MED ORDER — HEPARIN SOD (PORK) LOCK FLUSH 100 UNIT/ML IV SOLN
500.0000 [IU] | Freq: Once | INTRAVENOUS | Status: AC
  Administered 2023-05-03: 500 [IU] via INTRAVENOUS

## 2023-05-03 NOTE — Progress Notes (Signed)
 [facility] [phone]   [phone]  PROGRESS NOTE  Patient Care Team: [name], MD as PCP - General (Family Medicine) Jodelle Red, MD as PCP - Cardiology (Cardiology)  Hematological/Oncological History # Acute Myeloid Leukemia  08/09/2022: seen in [facility] emergency department for leukocytosis and anemia/thrombocytopenia. 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 Transferred to [facility] 7/3-7/18/2024: Admitted to [facility], treated with Azacytidine and Venetoclax. Course complicated by neutropenic fever.  7/19-7/25/2024: admitted for fluid overload/heart failure.   Interval History:  [name] 83 y.o. female with medical history significant for AML who presents for a follow up visit. The patient's last visit was on 02/02/2023. In the interim since the last visit she was admitted from 04/19/2023 until 04/29/2023 due to neutropenic fever.  Bone marrow biopsy during that visit did show 7% blasts in the bone marrow with a 20% cellular marrow, consistent with recurrent disease.  On exam today [name] reports she has been well since her discharge from the hospital in March 2025.  She reports she has been tired and sleeps a lot.  She reports that she does feel like she still has a good appetite.  She notes that she is not having any lightheadedness, dizziness, shortness of breath, just "really tired".  She knows she is not having any nausea, vomiting, or diarrhea.  She reports did have some diarrhea while in the hospital.  She reports her appetite has been strong.  Fortunately she has had no infectious symptoms since discharge.  She notes that she is not having any bleeding, bruising, or dark stools.  She is having no further teeth bleeding. She currently denies any fevers, chills, sweats, nausea vomiting or diarrhea.  A full 10 point ROS is otherwise negative.  Today we reiterated our role of providing supportive care for transfusions.  We are waiting  recommendations from Dr. Burnis Medin regarding next steps regarding treatment.  Gave her strict return precautions for any infectious signs or symptoms.  MEDICAL HISTORY:  Past Medical History:  Diagnosis Date   Diverticulitis    Former smoker    Hypertension    Lateral meniscus tear 2017   left knee   Migraines    Osteoarthritis    Osteopenia    TGA (transient global amnesia)    secondary to [place]   Thyroid disease     SURGICAL HISTORY: Past Surgical History:  Procedure Laterality Date   DILATION AND CURETTAGE OF UTERUS     menorrhagia   KNEE ARTHROSCOPY Right 10/06   TUBAL LIGATION      SOCIAL HISTORY: Social History   Socioeconomic History   Marital status: Divorced    Spouse name: Not on file   Number of children: 0   Years of education: Not on file   Highest education level: Not on file  Occupational History    Employer: [employer]  Tobacco Use   Smoking status: Former   Smokeless tobacco: Never  Vaping Use   Vaping status: Never Used  Substance and Sexual Activity   Alcohol use: Yes    Comment: 1 every  few months   Drug use: No   Sexual activity: Not Currently    Partners: Male    Birth control/protection: Post-menopausal  Other Topics Concern   Not on file  Social History Narrative   Divorced.   High school graduate.   Previously worked at [employer] at [employer].   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   Social Drivers  of Health   Financial Resource Strain: Not on file  Food Insecurity: Low Risk  (04/19/2023)   Received from [facility]   Hunger Vital Sign    Worried About Running Out of Food in the Last Year: Never true    Ran Out of Food in the Last Year: Never true  Transportation Needs: No Transportation Needs (04/19/2023)   Received from [facility]    In the past 12 months, has lack of reliable transportation kept you from medical appointments, meetings, work or from getting things needed for daily  living? : No  Physical Activity: Not on file  Stress: Not on file  Social Connections: Not on file  Intimate Partner Violence: Not on file    FAMILY HISTORY: Family History  Problem Relation Age of Onset   Heart disease Mother    Diabetes Father    Heart failure Father    COPD Father    Hyperlipidemia Sister    Diabetes Brother    Colon cancer Neg Hx    Breast cancer Neg Hx     ALLERGIES:  is allergic to [place] [zolpidem], codeine, dust mite extract, grass pollen(k-o-r-t-swt vern), hydrocodone, statins, sulfa antibiotics, and zofran [ondansetron].  MEDICATIONS:  Current Outpatient Medications  Medication Sig Dispense Refill   acyclovir (ZOVIRAX) 400 MG tablet Take 400 mg by mouth 2 (two) times daily.     ALPRAZolam (XANAX) 0.25 MG tablet TAKE 1 TABLET (0.25 MG TOTAL) BY MOUTH DAILY AS NEEDED. 30 tablet 3   Cyanocobalamin (VITAMIN B-12 PO) Take 1,000 mcg by mouth daily.     furosemide (LASIX) 20 MG tablet Take 20 mg by mouth daily as needed for fluid. Prn for weight gain >5 lbs     levofloxacin (LEVAQUIN) 250 MG tablet Take 250 mg by mouth daily.     levothyroxine (SYNTHROID) 50 MCG tablet TAKE 1 TABLET (50 MCG TOTAL) BY MOUTH DAILY. 90 tablet 3   losartan (COZAAR) 25 MG tablet Take 1 tablet (25 mg total) by mouth daily.     metoprolol succinate (TOPROL-XL) 25 MG 24 hr tablet Take 1 tablet by mouth daily.     mirtazapine (REMERON) 15 MG tablet Take 15 mg by mouth at bedtime.     polycarbophil (FIBERCON) 625 MG tablet Take 625 mg by mouth daily.     posaconazole (NOXAFIL) 100 MG TBEC delayed-release tablet Take 300 mg by mouth daily.     prochlorperazine (COMPAZINE) 5 MG tablet Take by mouth.     spironolactone (ALDACTONE) 25 MG tablet Take by mouth.     zinc sulfate 220 (50 Zn) MG capsule Take 220 mg by mouth daily.     No current facility-administered medications for this visit.   Facility-Administered Medications Ordered in Other Visits  Medication Dose Route Frequency  Provider Last Rate Last Admin   sodium chloride flush (NS) 0.9 % injection 10 mL  10 mL Intravenous PRN Jaci Standard, MD   10 mL at 05/03/23 0844    REVIEW OF SYSTEMS:   Constitutional: ( - ) fevers, ( - )  chills , ( - ) night sweats Eyes: ( - ) blurriness of vision, ( - ) double vision, ( - ) watery eyes Ears, nose, mouth, throat, and face: ( - ) mucositis, ( - ) sore throat Respiratory: ( - ) cough, ( - ) dyspnea, ( - ) wheezes Cardiovascular: ( - ) palpitation, ( - ) chest discomfort, ( - ) lower extremity swelling Gastrointestinal:  ( - )  nausea, ( - ) heartburn, ( - ) change in bowel habits Skin: ( - ) abnormal skin rashes Lymphatics: ( - ) new lymphadenopathy, ( - ) easy bruising Neurological: ( - ) numbness, ( - ) tingling, ( - ) new weaknesses Behavioral/Psych: ( - ) mood change, ( - ) new changes  All other systems were reviewed with the patient and are negative.  PHYSICAL EXAMINATION: ECOG PERFORMANCE STATUS: 2 - Symptomatic, <50% confined to bed  Vitals:   05/03/23 0914  BP: (!) 118/59  Pulse: 61  Resp: 13  Temp: 98.5 F (36.9 C)  SpO2: 100%     Filed Weights   05/03/23 0914  Weight: 112 lb 3.2 oz (50.9 kg)      GENERAL: Well-appearing elderly Caucasian female alert, no distress and comfortable SKIN: skin color, texture, turgor are normal, no rashes or significant lesions EYES: conjunctiva are pink and non-injected, sclera clear LUNGS: clear to auscultation and percussion with normal breathing effort HEART: regular rate & rhythm and no murmurs and no lower extremity edema Musculoskeletal: no cyanosis of digits and no clubbing  PSYCH: alert & oriented x 3, fluent speech NEURO: no focal motor/sensory deficits  LABORATORY DATA:  I have reviewed the data as listed    Latest Ref Rng & Units 05/03/2023    8:52 AM 04/30/2023    7:41 AM 04/16/2023    7:36 AM  CBC  WBC 4.0 - 10.5 K/uL 1.0  1.2  1.0   Hemoglobin 12.0 - 15.0 g/dL 28.4  13.2  8.1    Hematocrit 36.0 - 46.0 % 34.0  34.9  25.0   Platelets 150 - 400 K/uL 157  181  127        Latest Ref Rng & Units 05/03/2023    8:52 AM 04/30/2023    7:41 AM 04/16/2023    7:36 AM  CMP  Glucose 70 - 99 mg/dL 440  102  725   BUN 8 - 23 mg/dL 25  20  46   Creatinine 0.44 - 1.00 mg/dL 3.66  4.40  3.47   Sodium 135 - 145 mmol/L 139  136  136   Potassium 3.5 - 5.1 mmol/L 4.1  4.1  4.2   Chloride 98 - 111 mmol/L 102  101  102   CO2 22 - 32 mmol/L 28  27  26    Calcium 8.9 - 10.3 mg/dL 9.2  9.1  9.0   Total Protein 6.5 - 8.1 g/dL 7.0  7.0  7.2   Total Bilirubin 0.0 - 1.2 mg/dL 0.9  1.0  0.5   Alkaline Phos 38 - 126 U/L 179  157  102   AST 15 - 41 U/L 41  32  19   ALT 0 - 44 U/L 40  33  21     RADIOGRAPHIC STUDIES: No results found.  ASSESSMENT & PLAN [name] 83 y.o. female with medical history significant for AML who presents for a follow up visit.  Previously we discussed the concept of comanaged care.  Comanaged care is when the patient has a local primary provider who administers local support and therapy while expert advice and treatment recommendations are rendered by a cancer specialist at a large academic center.  In this arrangement we provide local support, labs, treatment, and emergency visits, however the major decisions regarding the course of treatment are decided by a physician at an academic medical center.  The patient voiced her understanding of comanaged care and was agreeable  to proceeding forward with this care model.  # Acute Myeloid Leukemia  -- Patient currently follows with Dr. Lowella Bandy at [facility].   --Patient had bone marrow biopsy during hospitalization for neutropenic fever in March 2025.  Awaiting new treatment options/recommendations given concern for recurrence with 7% blast cells. -- We will provide supportive care as requested with labs on Mondays and Thursdays with transfusion as necessary -- Labs today show white blood cell  count 1.0, Hgb 10.9, Plt 157, ANC 0.1 -- Recommend twice weekly lab checks with a return clinic visit in 8 weeks time.   # Gum Bleeding--resolved.  -- Patient is having bleeding at the base of one of her left lower molars. -- Patient is not having any lightheadedness, dizziness, or shortness of breath. -- Recommended tranexamic acid 650 mg and 15 mL of water, swish and spit for 2 minutes. -- Strict return precautions if she were to develop any lightheadedness, dizziness, shortness of breath or if bleeding did not resolve by Wednesday. -- also received a prescription for Amicar   No orders of the defined types were placed in this encounter.   All questions were answered. The patient knows to call the clinic with any problems, questions or concerns.  A total of more than 30 minutes were spent on this encounter with face-to-face time and non-face-to-face time, including preparing to see the patient, ordering tests and/or medications, counseling the patient and coordination of care as outlined above.   Ulysees Barns, MD Department of Hematology/Oncology [facility] at [facility] Phone: [phone] Pager: [phone] Email: [email or web address]  05/03/2023 11:01 AM

## 2023-05-07 ENCOUNTER — Telehealth: Payer: Self-pay

## 2023-05-07 ENCOUNTER — Emergency Department (HOSPITAL_COMMUNITY)

## 2023-05-07 ENCOUNTER — Other Ambulatory Visit: Payer: Self-pay

## 2023-05-07 ENCOUNTER — Emergency Department (HOSPITAL_COMMUNITY)
Admission: EM | Admit: 2023-05-07 | Discharge: 2023-05-07 | Disposition: A | Discharge status: Home / Self Care | Attending: Emergency Medicine | Admitting: Emergency Medicine

## 2023-05-07 ENCOUNTER — Inpatient Hospital Stay

## 2023-05-07 VITALS — BP 121/57 | HR 75

## 2023-05-07 DIAGNOSIS — E039 Hypothyroidism, unspecified: Secondary | ICD-10-CM | POA: Diagnosis not present

## 2023-05-07 DIAGNOSIS — C9200 Acute myeloblastic leukemia, not having achieved remission: Secondary | ICD-10-CM

## 2023-05-07 DIAGNOSIS — E860 Dehydration: Secondary | ICD-10-CM | POA: Insufficient documentation

## 2023-05-07 DIAGNOSIS — I427 Cardiomyopathy due to drug and external agent: Secondary | ICD-10-CM | POA: Diagnosis not present

## 2023-05-07 DIAGNOSIS — Z8249 Family history of ischemic heart disease and other diseases of the circulatory system: Secondary | ICD-10-CM | POA: Diagnosis not present

## 2023-05-07 DIAGNOSIS — Z882 Allergy status to sulfonamides status: Secondary | ICD-10-CM | POA: Diagnosis not present

## 2023-05-07 DIAGNOSIS — Z1152 Encounter for screening for COVID-19: Secondary | ICD-10-CM | POA: Diagnosis not present

## 2023-05-07 DIAGNOSIS — I509 Heart failure, unspecified: Secondary | ICD-10-CM | POA: Diagnosis not present

## 2023-05-07 DIAGNOSIS — Z79890 Hormone replacement therapy (postmenopausal): Secondary | ICD-10-CM | POA: Insufficient documentation

## 2023-05-07 DIAGNOSIS — Z8349 Family history of other endocrine, nutritional and metabolic diseases: Secondary | ICD-10-CM | POA: Diagnosis not present

## 2023-05-07 DIAGNOSIS — D61818 Other pancytopenia: Secondary | ICD-10-CM | POA: Diagnosis not present

## 2023-05-07 DIAGNOSIS — R112 Nausea with vomiting, unspecified: Secondary | ICD-10-CM | POA: Insufficient documentation

## 2023-05-07 DIAGNOSIS — C9290 Myeloid leukemia, unspecified, not having achieved remission: Secondary | ICD-10-CM | POA: Insufficient documentation

## 2023-05-07 DIAGNOSIS — E871 Hypo-osmolality and hyponatremia: Secondary | ICD-10-CM | POA: Diagnosis not present

## 2023-05-07 DIAGNOSIS — I110 Hypertensive heart disease with heart failure: Secondary | ICD-10-CM | POA: Diagnosis not present

## 2023-05-07 DIAGNOSIS — Z79899 Other long term (current) drug therapy: Secondary | ICD-10-CM | POA: Insufficient documentation

## 2023-05-07 DIAGNOSIS — Z825 Family history of asthma and other chronic lower respiratory diseases: Secondary | ICD-10-CM | POA: Diagnosis not present

## 2023-05-07 DIAGNOSIS — Z87891 Personal history of nicotine dependence: Secondary | ICD-10-CM | POA: Diagnosis not present

## 2023-05-07 DIAGNOSIS — J189 Pneumonia, unspecified organism: Secondary | ICD-10-CM | POA: Diagnosis not present

## 2023-05-07 DIAGNOSIS — E876 Hypokalemia: Secondary | ICD-10-CM | POA: Diagnosis not present

## 2023-05-07 DIAGNOSIS — R5081 Fever presenting with conditions classified elsewhere: Secondary | ICD-10-CM | POA: Diagnosis not present

## 2023-05-07 DIAGNOSIS — I10 Essential (primary) hypertension: Secondary | ICD-10-CM | POA: Diagnosis not present

## 2023-05-07 DIAGNOSIS — Z833 Family history of diabetes mellitus: Secondary | ICD-10-CM | POA: Diagnosis not present

## 2023-05-07 DIAGNOSIS — A419 Sepsis, unspecified organism: Secondary | ICD-10-CM | POA: Diagnosis not present

## 2023-05-07 DIAGNOSIS — M8580 Other specified disorders of bone density and structure, unspecified site: Secondary | ICD-10-CM | POA: Diagnosis not present

## 2023-05-07 DIAGNOSIS — Z885 Allergy status to narcotic agent status: Secondary | ICD-10-CM | POA: Diagnosis not present

## 2023-05-07 DIAGNOSIS — K5900 Constipation, unspecified: Secondary | ICD-10-CM | POA: Diagnosis not present

## 2023-05-07 DIAGNOSIS — R509 Fever, unspecified: Secondary | ICD-10-CM | POA: Diagnosis not present

## 2023-05-07 DIAGNOSIS — Z888 Allergy status to other drugs, medicaments and biological substances status: Secondary | ICD-10-CM | POA: Diagnosis not present

## 2023-05-07 LAB — CBC WITH DIFFERENTIAL (CANCER CENTER ONLY)
Abs Immature Granulocytes: 0.04 10*3/uL (ref 0.00–0.07)
Basophils Absolute: 0 10*3/uL (ref 0.0–0.1)
Basophils Relative: 0 %
Eosinophils Absolute: 0 10*3/uL (ref 0.0–0.5)
Eosinophils Relative: 0 %
HCT: 31 % — ABNORMAL LOW (ref 36.0–46.0)
Hemoglobin: 10 g/dL — ABNORMAL LOW (ref 12.0–15.0)
Immature Granulocytes: 4 %
Lymphocytes Relative: 68 %
Lymphs Abs: 0.6 10*3/uL — ABNORMAL LOW (ref 0.7–4.0)
MCH: 29.4 pg (ref 26.0–34.0)
MCHC: 32.3 g/dL (ref 30.0–36.0)
MCV: 91.2 fL (ref 80.0–100.0)
Monocytes Absolute: 0.2 10*3/uL (ref 0.1–1.0)
Monocytes Relative: 19 %
Neutro Abs: 0.1 10*3/uL — CL (ref 1.7–7.7)
Neutrophils Relative %: 9 %
Platelet Count: 117 10*3/uL — ABNORMAL LOW (ref 150–400)
RBC: 3.4 MIL/uL — ABNORMAL LOW (ref 3.87–5.11)
RDW: 19.7 % — ABNORMAL HIGH (ref 11.5–15.5)
WBC Count: 0.9 10*3/uL — CL (ref 4.0–10.5)
nRBC: 0 % (ref 0.0–0.2)

## 2023-05-07 LAB — CMP (CANCER CENTER ONLY)
ALT: 48 U/L — ABNORMAL HIGH (ref 0–44)
AST: 40 U/L (ref 15–41)
Albumin: 3.4 g/dL — ABNORMAL LOW (ref 3.5–5.0)
Alkaline Phosphatase: 153 U/L — ABNORMAL HIGH (ref 38–126)
Anion gap: 10 (ref 5–15)
BUN: 23 mg/dL (ref 8–23)
CO2: 27 mmol/L (ref 22–32)
Calcium: 9.5 mg/dL (ref 8.9–10.3)
Chloride: 102 mmol/L (ref 98–111)
Creatinine: 1.02 mg/dL — ABNORMAL HIGH (ref 0.44–1.00)
GFR, Estimated: 56 mL/min — ABNORMAL LOW (ref >60)
Glucose, Bld: 134 mg/dL — ABNORMAL HIGH (ref 70–99)
Potassium: 4.1 mmol/L (ref 3.5–5.1)
Sodium: 139 mmol/L (ref 135–145)
Total Bilirubin: 1 mg/dL (ref 0.0–1.2)
Total Protein: 7.2 g/dL (ref 6.5–8.1)

## 2023-05-07 LAB — RESP PANEL BY RT-PCR (RSV, FLU A&B, COVID)  RVPGX2
Influenza A by PCR: NEGATIVE
Influenza B by PCR: NEGATIVE
Resp Syncytial Virus by PCR: NEGATIVE
SARS Coronavirus 2 by RT PCR: NEGATIVE

## 2023-05-07 LAB — SAMPLE TO BLOOD BANK

## 2023-05-07 LAB — MAGNESIUM: Magnesium: 1.8 mg/dL (ref 1.7–2.4)

## 2023-05-07 LAB — TSH: TSH: 8.761 u[IU]/mL — ABNORMAL HIGH (ref 0.350–4.500)

## 2023-05-07 LAB — CBG MONITORING, ED: Glucose-Capillary: 112 mg/dL — ABNORMAL HIGH (ref 70–99)

## 2023-05-07 MED ORDER — HEPARIN SOD (PORK) LOCK FLUSH 100 UNIT/ML IV SOLN: 500.0000 [IU] | Freq: Once | INTRAVENOUS | Status: DC

## 2023-05-07 MED ORDER — HEPARIN SOD (PORK) LOCK FLUSH 100 UNIT/ML IV SOLN
INTRAVENOUS | Status: AC
  Administered 2023-05-07: 100 [IU]
  Filled 2023-05-07: qty 5

## 2023-05-07 MED ORDER — SODIUM CHLORIDE 0.9 % IV SOLN
12.5000 mg | Freq: Once | INTRAVENOUS | Status: AC
  Administered 2023-05-07: 12.5 mg via INTRAVENOUS
  Filled 2023-05-07: qty 12.5

## 2023-05-07 MED ORDER — SODIUM CHLORIDE 0.9 % IV BOLUS
1000.0000 mL | Freq: Once | INTRAVENOUS | Status: AC
  Administered 2023-05-07: 1000 mL via INTRAVENOUS

## 2023-05-07 MED ORDER — SODIUM CHLORIDE 0.9% FLUSH
10.0000 mL | INTRAVENOUS | Status: DC | PRN
  Administered 2023-05-07: 10 mL via INTRAVENOUS

## 2023-05-07 NOTE — Telephone Encounter (Signed)
 Her Hgb today is 10.0

## 2023-05-07 NOTE — Telephone Encounter (Signed)
 CRITICAL VALUE STICKER   CRITICAL VALUE:  WBC 0.9    ANC 0.1 (waiting on dif for confirmation)  RECEIVER (on-site recipient of call):  Daneil Dolin, LPN  DATE & TIME NOTIFIED: 08:25  05/07/23  MESSENGER (representative from lab):  MD NOTIFIED: Jeanie Sewer, MD  TIME OF NOTIFICATION: 08:30  RESPONSE:

## 2023-05-07 NOTE — ED Triage Notes (Signed)
 Pt started feeling weak 2 days ago. Pt denies any fevers and checks her temperature regularly. Pt has not been eating or drinking adequately and when she attempted to eat a small meal yesterday, she vomited. Pt noticed small amount of blood.

## 2023-05-07 NOTE — ED Provider Notes (Signed)
 [place] EMERGENCY DEPARTMENT AT [facility] Provider Note   CSN: [id number] Arrival date & time: 05/07/23  0900     History  Chief Complaint  Patient presents with   Generalized Body Aches   Emesis   Nausea   Weakness    [name] is a 83 y.o. female.  Pt is a 83 yo female with pmhx significant for AML, htn, hypothyroidism, arthritis, and diverticulitis.  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  She was admitted to [facility] for induction chemo.  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  She was last admitted in March for a neutropenic fever and pna with BM bx showing 7% blasts in the bone marrow with a 20% cellular marrow c/o recurrent disease.  She is co-managed in [place] by Dr. Leonides Schanz and sees Dr. Lowella Bandy in [facility] for her AML.  She has not had an fevers, but has been feeling weak.  She has not been eating/drinking well.  She threw up yesterday and it has a small amt of blood in it.  Pt did go to the cancer center this am for blood work and was sent here for further eval.  Pt is not currently on chemo.           Home Medications Prior to Admission medications   Medication Sig Start Date End Date Taking? Authorizing Provider  acyclovir (ZOVIRAX) 400 MG tablet Take 400 mg by mouth 2 (two) times daily. 08/24/22   [provider]  ALPRAZolam Prudy Feeler) 0.25 MG tablet TAKE 1 TABLET (0.25 MG TOTAL) BY MOUTH DAILY AS NEEDED. 10/22/22   [name], MD  Cyanocobalamin (VITAMIN B-12 PO) Take 1,000 mcg by mouth daily.    [provider]  furosemide (LASIX) 20 MG tablet Take 20 mg by mouth daily as needed for fluid. Prn for weight gain >5 lbs 08/31/22   [provider]  levofloxacin (LEVAQUIN) 250 MG tablet Take 250 mg by mouth daily. 12/21/22 12/21/23  [provider]  levothyroxine (SYNTHROID) 50 MCG tablet TAKE 1 TABLET (50 MCG TOTAL) BY MOUTH DAILY. 12/25/22   [name], MD  losartan (COZAAR) 25 MG tablet  Take 1 tablet (25 mg total) by mouth daily. 04/04/23 07/03/23  Jodelle Red, MD  metoprolol succinate (TOPROL-XL) 25 MG 24 hr tablet Take 1 tablet by mouth daily. 09/18/22   [provider]  mirtazapine (REMERON) 15 MG tablet Take 15 mg by mouth at bedtime.    [provider]  polycarbophil (FIBERCON) 625 MG tablet Take 625 mg by mouth daily.    [provider]  posaconazole (NOXAFIL) 100 MG TBEC delayed-release tablet Take 300 mg by mouth daily.    [provider]  prochlorperazine (COMPAZINE) 5 MG tablet Take by mouth. 08/24/22   [provider]  spironolactone (ALDACTONE) 25 MG tablet Take by mouth. 09/18/22   [provider]  zinc sulfate 220 (50 Zn) MG capsule Take 220 mg by mouth daily.    [provider]      Allergies    Ambien [zolpidem], Codeine, Dust mite extract, Grass pollen(k-o-r-t-swt vern), Hydrocodone, Statins, Sulfa antibiotics, and Zofran [ondansetron]    Review of Systems   Review of Systems  Constitutional:  Positive for appetite change.  Gastrointestinal:  Positive for nausea and vomiting.  Neurological:  Positive for weakness.  All other systems reviewed and are negative.   Physical Exam Updated Vital Signs BP 137/60   Pulse  90   Temp 98.2 F (36.8 C)   Resp 18   Ht 5\' 4"  (1.626 m)   Wt 50.8 kg   LMP 01/19/1992 (Exact Date)   SpO2 98%   BMI 19.22 kg/m  Physical Exam Vitals and nursing note reviewed.  Constitutional:      Appearance: Normal appearance.  HENT:     Head: Normocephalic and atraumatic.     Right Ear: External ear normal.     Left Ear: External ear normal.     Nose: Nose normal.     Mouth/Throat:     Mouth: Mucous membranes are dry.  Eyes:     Extraocular Movements: Extraocular movements intact.     Conjunctiva/sclera: Conjunctivae normal.     Pupils: Pupils are equal, round, and reactive to light.  Cardiovascular:     Rate and Rhythm: Normal rate and regular rhythm.      Pulses: Normal pulses.     Heart sounds: Normal heart sounds.  Pulmonary:     Effort: Pulmonary effort is normal.     Breath sounds: Normal breath sounds.  Abdominal:     General: Abdomen is flat. Bowel sounds are normal.     Palpations: Abdomen is soft.  Musculoskeletal:        General: Normal range of motion.     Cervical back: Normal range of motion and neck supple.  Skin:    General: Skin is warm.     Capillary Refill: Capillary refill takes less than 2 seconds.  Neurological:     General: No focal deficit present.     Mental Status: She is alert and oriented to person, place, and time.  Psychiatric:        Mood and Affect: Mood normal.        Behavior: Behavior normal.     ED Results / Procedures / Treatments   Labs (all labs ordered are listed, but only abnormal results are displayed) Labs Reviewed  TSH - Abnormal; Notable for the following components:      Result Value   TSH 8.761 (*)    All other components within normal limits  CBG MONITORING, ED - Abnormal; Notable for the following components:   Glucose-Capillary 112 (*)    All other components within normal limits  RESP PANEL BY RT-PCR (RSV, FLU A&B, COVID)  [id number]  CULTURE, BLOOD (ROUTINE X 2)  CULTURE, BLOOD (ROUTINE X 2) W REFLEX TO ID PANEL  URINALYSIS, ROUTINE W REFLEX MICROSCOPIC  I-STAT CG4 LACTIC ACID, ED  I-STAT CG4 LACTIC ACID, ED    EKG EKG Interpretation Date/Time:  Monday May 07 2023 09:11:55 EDT Ventricular Rate:  65 PR Interval:  125 QRS Duration:  74 QT Interval:  563 QTC Calculation: 586 R Axis:   73  Text Interpretation: Sinus rhythm Atrial premature complex Nonspecific T abnormalities, lateral leads Prolonged QT interval slt qtc has prolonged Confirmed by Jacalyn Lefevre [phone]) on 05/07/2023 10:01:16 AM  Radiology DG Chest Portable 1 View Result Date: 05/07/2023 CLINICAL DATA:  ALTERED MENTAL STATUS. EXAM: PORTABLE CHEST 1 VIEW COMPARISON:  05/17/2013. FINDINGS: Bilateral  lung fields are clear. Bilateral costophrenic angles are clear. Normal cardio-mediastinal silhouette. No acute osseous abnormalities. The soft tissues are within normal limits. Right-sided CT Port-A-Cath is seen with its tip overlying the cavoatrial junction region. IMPRESSION: No active disease. Electronically Signed   By: Jules Schick M.D.   On: 05/07/2023 11:01    Procedures Procedures    Medications Ordered in ED Medications  sodium  chloride 0.9 % bolus 1,000 mL (0 mLs Intravenous Stopped 05/07/23 1222)  promethazine (PHENERGAN) 12.5 mg in sodium chloride 0.9 % 50 mL IVPB (0 mg Intravenous Stopped 05/07/23 1222)  heparin lock flush 100 UNIT/ML injection (100 Units  Given 05/07/23 1223)    ED Course/ Medical Decision Making/ A&P                                 Medical Decision Making Amount and/or Complexity of Data Reviewed Labs: ordered. Radiology: ordered.   This patient presents to the ED for concern of weakness, this involves an extensive number of treatment options, and is a complaint that carries with it a high risk of complications and morbidity.  The differential diagnosis includes covid/flu/rsv, pna, uti, electrolyte abn, pancytopenia   Co morbidities that complicate the patient evaluation  AML, htn, hypothyroidism, arthritis, and diverticulitis   Additional history obtained:  Additional history obtained from epic chart review External records from outside source obtained and reviewed including sister   Lab Tests:  I Ordered, and personally interpreted labs.  The pertinent results include:  cbc with wbc 0.9 (1.0 on 3/27), hgb 10 (10.9 on 3/27), and plt 117 (157 on 3/27); cmp with cr sl elevated at 1.02 (cr 0.89 on 3/27); covid/flu/rsv neg; TSH elevated at 8.761   Imaging Studies ordered:  I ordered imaging studies including cxr  I independently visualized and interpreted imaging which showed No active disease.  I agree with the radiologist  interpretation   Cardiac Monitoring:  The patient was maintained on a cardiac monitor.  I personally viewed and interpreted the cardiac monitored which showed an underlying rhythm of: nsr   Medicines ordered and prescription drug management:  I ordered medication including ivfs/phenergan  for sx  Reevaluation of the patient after these medicines showed that the patient improved I have reviewed the patients home medicines and have made adjustments as needed   Test Considered:  ct   Critical Interventions:  ivfs   Problem List / ED Course:  Dehydration and n/v:  pt is feeling better after fluids and meds.  She is now tolerating fluids and sandwich. Pancytopenia:  stable.  No evidence of infection or fever. AML:  no active issues currently.  Hgb and plt stable.   Reevaluation:  After the interventions noted above, I reevaluated the patient and found that they have :improved   Social Determinants of Health:  Lives at home   Dispostion:  After consideration of the diagnostic results and the patients response to treatment, I feel that the patent would benefit from discharge with outpatient f/u.          Final Clinical Impression(s) / ED Diagnoses Final diagnoses:  Pancytopenia (HCC)  Dehydration  Nausea and vomiting, unspecified vomiting type    Rx / DC Orders ED Discharge Orders     None         Jacalyn Lefevre, MD 05/07/23 1239

## 2023-05-07 NOTE — Telephone Encounter (Signed)
 Pt came in for blood work today stating she threw up blood yesterday, is SOB and extremely fatigued. She is very cold and shivering. She is in the blue room waiting on lab results to see if she needs blood today.  Please advise

## 2023-05-07 NOTE — Progress Notes (Signed)
 Briefly this is an 83 year old female with history of AML treated by oncologist Dr. Leonides Schanz and comanaged with [facility] Dr. Lowella Bandy.  Patient's current treatment plan is supportive transfusions.  She will be following up with Dr. Lowella Bandy on 3/14 to discuss the results of her recent bone marrow biopsy and treatment options.   I was informed by RN that patient is not feeling well.  Patient is here today for routine labs.  CBC is showing pancytopenia which is consistent with previous labs.  Hemoglobin is 10 and platelets are 1 17K, no transfusions required.  Per patient's sister she has been feeling unwell since the weekend.  She has been complaining of chills and not getting out of bed.  Patient had an episode of hematemesis yesterday.  She was recently hospitalized at [facility] for 11 days for treatment of pneumonia and discharged on 3/23.  On my exam patient is ill-appearing with stable vital signs.  In the setting of pancytopenia and recent infection patient will need emergent evaluation in the emergency department.  Patient and sister are agreeable with this plan.  Patient transferred to ED by myself and RN. Report given to accepting RN.Oncologist will be notified as well.

## 2023-05-10 ENCOUNTER — Encounter (HOSPITAL_COMMUNITY): Payer: Self-pay

## 2023-05-10 ENCOUNTER — Telehealth: Payer: Self-pay | Admitting: *Deleted

## 2023-05-10 ENCOUNTER — Emergency Department (HOSPITAL_COMMUNITY)

## 2023-05-10 ENCOUNTER — Inpatient Hospital Stay (HOSPITAL_COMMUNITY)
Admission: EM | Admit: 2023-05-10 | Discharge: 2023-05-16 | DRG: 194 | Disposition: A | Discharge status: Home / Self Care | Attending: Internal Medicine | Admitting: Internal Medicine

## 2023-05-10 ENCOUNTER — Other Ambulatory Visit: Payer: Self-pay

## 2023-05-10 ENCOUNTER — Inpatient Hospital Stay: Attending: Hematology and Oncology

## 2023-05-10 VITALS — BP 137/67 | HR 63 | Temp 98.9°F | Resp 17

## 2023-05-10 DIAGNOSIS — Z8349 Family history of other endocrine, nutritional and metabolic diseases: Secondary | ICD-10-CM | POA: Diagnosis not present

## 2023-05-10 DIAGNOSIS — K5900 Constipation, unspecified: Secondary | ICD-10-CM | POA: Diagnosis not present

## 2023-05-10 DIAGNOSIS — Z87891 Personal history of nicotine dependence: Secondary | ICD-10-CM

## 2023-05-10 DIAGNOSIS — Z1152 Encounter for screening for COVID-19: Secondary | ICD-10-CM

## 2023-05-10 DIAGNOSIS — E876 Hypokalemia: Secondary | ICD-10-CM | POA: Diagnosis not present

## 2023-05-10 DIAGNOSIS — I509 Heart failure, unspecified: Secondary | ICD-10-CM | POA: Diagnosis present

## 2023-05-10 DIAGNOSIS — I427 Cardiomyopathy due to drug and external agent: Secondary | ICD-10-CM | POA: Diagnosis not present

## 2023-05-10 DIAGNOSIS — M8580 Other specified disorders of bone density and structure, unspecified site: Secondary | ICD-10-CM | POA: Diagnosis present

## 2023-05-10 DIAGNOSIS — C9200 Acute myeloblastic leukemia, not having achieved remission: Secondary | ICD-10-CM | POA: Insufficient documentation

## 2023-05-10 DIAGNOSIS — Z888 Allergy status to other drugs, medicaments and biological substances status: Secondary | ICD-10-CM

## 2023-05-10 DIAGNOSIS — E860 Dehydration: Secondary | ICD-10-CM | POA: Diagnosis present

## 2023-05-10 DIAGNOSIS — G43909 Migraine, unspecified, not intractable, without status migrainosus: Secondary | ICD-10-CM | POA: Diagnosis present

## 2023-05-10 DIAGNOSIS — R5081 Fever presenting with conditions classified elsewhere: Secondary | ICD-10-CM | POA: Diagnosis not present

## 2023-05-10 DIAGNOSIS — Z825 Family history of asthma and other chronic lower respiratory diseases: Secondary | ICD-10-CM | POA: Diagnosis not present

## 2023-05-10 DIAGNOSIS — J189 Pneumonia, unspecified organism: Principal | ICD-10-CM | POA: Diagnosis present

## 2023-05-10 DIAGNOSIS — E871 Hypo-osmolality and hyponatremia: Secondary | ICD-10-CM | POA: Diagnosis not present

## 2023-05-10 DIAGNOSIS — Z8249 Family history of ischemic heart disease and other diseases of the circulatory system: Secondary | ICD-10-CM | POA: Diagnosis not present

## 2023-05-10 DIAGNOSIS — Z79899 Other long term (current) drug therapy: Secondary | ICD-10-CM | POA: Diagnosis not present

## 2023-05-10 DIAGNOSIS — Z79890 Hormone replacement therapy (postmenopausal): Secondary | ICD-10-CM | POA: Diagnosis not present

## 2023-05-10 DIAGNOSIS — E039 Hypothyroidism, unspecified: Secondary | ICD-10-CM | POA: Diagnosis present

## 2023-05-10 DIAGNOSIS — D61818 Other pancytopenia: Secondary | ICD-10-CM | POA: Insufficient documentation

## 2023-05-10 DIAGNOSIS — Z833 Family history of diabetes mellitus: Secondary | ICD-10-CM | POA: Diagnosis not present

## 2023-05-10 DIAGNOSIS — I10 Essential (primary) hypertension: Secondary | ICD-10-CM | POA: Diagnosis present

## 2023-05-10 DIAGNOSIS — I110 Hypertensive heart disease with heart failure: Secondary | ICD-10-CM | POA: Diagnosis present

## 2023-05-10 DIAGNOSIS — Z885 Allergy status to narcotic agent status: Secondary | ICD-10-CM | POA: Diagnosis not present

## 2023-05-10 DIAGNOSIS — M1990 Unspecified osteoarthritis, unspecified site: Secondary | ICD-10-CM | POA: Diagnosis present

## 2023-05-10 DIAGNOSIS — Z882 Allergy status to sulfonamides status: Secondary | ICD-10-CM

## 2023-05-10 DIAGNOSIS — D709 Neutropenia, unspecified: Secondary | ICD-10-CM | POA: Insufficient documentation

## 2023-05-10 DIAGNOSIS — A419 Sepsis, unspecified organism: Secondary | ICD-10-CM | POA: Diagnosis not present

## 2023-05-10 DIAGNOSIS — R509 Fever, unspecified: Secondary | ICD-10-CM | POA: Diagnosis not present

## 2023-05-10 DIAGNOSIS — I700 Atherosclerosis of aorta: Secondary | ICD-10-CM | POA: Diagnosis not present

## 2023-05-10 LAB — CBC WITH DIFFERENTIAL (CANCER CENTER ONLY)
Abs Immature Granulocytes: 0.08 10*3/uL — ABNORMAL HIGH (ref 0.00–0.07)
Basophils Absolute: 0 10*3/uL (ref 0.0–0.1)
Basophils Relative: 0 %
Eosinophils Absolute: 0 10*3/uL (ref 0.0–0.5)
Eosinophils Relative: 0 %
HCT: 28.3 % — ABNORMAL LOW (ref 36.0–46.0)
Hemoglobin: 9.1 g/dL — ABNORMAL LOW (ref 12.0–15.0)
Immature Granulocytes: 8 %
Lymphocytes Relative: 64 %
Lymphs Abs: 0.6 10*3/uL — ABNORMAL LOW (ref 0.7–4.0)
MCH: 29.8 pg (ref 26.0–34.0)
MCHC: 32.2 g/dL (ref 30.0–36.0)
MCV: 92.8 fL (ref 80.0–100.0)
Monocytes Absolute: 0.2 10*3/uL (ref 0.1–1.0)
Monocytes Relative: 22 %
Neutro Abs: 0.1 10*3/uL — CL (ref 1.7–7.7)
Neutrophils Relative %: 6 %
Platelet Count: 110 10*3/uL — ABNORMAL LOW (ref 150–400)
RBC: 3.05 MIL/uL — ABNORMAL LOW (ref 3.87–5.11)
RDW: 19.3 % — ABNORMAL HIGH (ref 11.5–15.5)
WBC Count: 1 10*3/uL — ABNORMAL LOW (ref 4.0–10.5)
WBC Morphology: 1
nRBC: 0 % (ref 0.0–0.2)

## 2023-05-10 LAB — COMPREHENSIVE METABOLIC PANEL WITH GFR
ALT: 39 U/L (ref 0–44)
AST: 34 U/L (ref 15–41)
Albumin: 2.6 g/dL — ABNORMAL LOW (ref 3.5–5.0)
Alkaline Phosphatase: 121 U/L (ref 38–126)
Anion gap: 10 (ref 5–15)
BUN: 16 mg/dL (ref 8–23)
CO2: 24 mmol/L (ref 22–32)
Calcium: 8.7 mg/dL — ABNORMAL LOW (ref 8.9–10.3)
Chloride: 101 mmol/L (ref 98–111)
Creatinine, Ser: 0.79 mg/dL (ref 0.44–1.00)
GFR, Estimated: >60 mL/min (ref >60)
Glucose, Bld: 115 mg/dL — ABNORMAL HIGH (ref 70–99)
Potassium: 3.8 mmol/L (ref 3.5–5.1)
Sodium: 135 mmol/L (ref 135–145)
Total Bilirubin: 1.3 mg/dL — ABNORMAL HIGH (ref 0.0–1.2)
Total Protein: 6.6 g/dL (ref 6.5–8.1)

## 2023-05-10 LAB — CBC WITH DIFFERENTIAL/PLATELET
Abs Immature Granulocytes: 0 10*3/uL (ref 0.00–0.07)
Basophils Absolute: 0 10*3/uL (ref 0.0–0.1)
Basophils Relative: 0 %
Eosinophils Absolute: 0 10*3/uL (ref 0.0–0.5)
Eosinophils Relative: 0 %
HCT: 27.7 % — ABNORMAL LOW (ref 36.0–46.0)
Hemoglobin: 8.7 g/dL — ABNORMAL LOW (ref 12.0–15.0)
Lymphocytes Relative: 92 %
Lymphs Abs: 0.9 10*3/uL (ref 0.7–4.0)
MCH: 29.7 pg (ref 26.0–34.0)
MCHC: 31.4 g/dL (ref 30.0–36.0)
MCV: 94.5 fL (ref 80.0–100.0)
Monocytes Absolute: 0 10*3/uL — ABNORMAL LOW (ref 0.1–1.0)
Monocytes Relative: 4 %
Neutro Abs: 0 10*3/uL — CL (ref 1.7–7.7)
Neutrophils Relative %: 4 %
Platelets: 96 10*3/uL — ABNORMAL LOW (ref 150–400)
RBC: 2.93 MIL/uL — ABNORMAL LOW (ref 3.87–5.11)
RDW: 19.4 % — ABNORMAL HIGH (ref 11.5–15.5)
WBC: 1 10*3/uL — CL (ref 4.0–10.5)
nRBC: 0 % (ref 0.0–0.2)

## 2023-05-10 LAB — CMP (CANCER CENTER ONLY)
ALT: 39 U/L (ref 0–44)
AST: 30 U/L (ref 15–41)
Albumin: 3.3 g/dL — ABNORMAL LOW (ref 3.5–5.0)
Alkaline Phosphatase: 140 U/L — ABNORMAL HIGH (ref 38–126)
Anion gap: 10 (ref 5–15)
BUN: 14 mg/dL (ref 8–23)
CO2: 27 mmol/L (ref 22–32)
Calcium: 9.1 mg/dL (ref 8.9–10.3)
Chloride: 101 mmol/L (ref 98–111)
Creatinine: 0.78 mg/dL (ref 0.44–1.00)
GFR, Estimated: >60 mL/min (ref >60)
Glucose, Bld: 121 mg/dL — ABNORMAL HIGH (ref 70–99)
Potassium: 3.8 mmol/L (ref 3.5–5.1)
Sodium: 138 mmol/L (ref 135–145)
Total Bilirubin: 1.3 mg/dL — ABNORMAL HIGH (ref 0.0–1.2)
Total Protein: 7 g/dL (ref 6.5–8.1)

## 2023-05-10 LAB — RESP PANEL BY RT-PCR (RSV, FLU A&B, COVID)  RVPGX2
Influenza A by PCR: NEGATIVE
Influenza B by PCR: NEGATIVE
Resp Syncytial Virus by PCR: NEGATIVE
SARS Coronavirus 2 by RT PCR: NEGATIVE

## 2023-05-10 LAB — URINALYSIS, ROUTINE W REFLEX MICROSCOPIC
Bilirubin Urine: NEGATIVE
Glucose, UA: NEGATIVE mg/dL
Hgb urine dipstick: NEGATIVE
Ketones, ur: NEGATIVE mg/dL
Leukocytes,Ua: NEGATIVE
Nitrite: NEGATIVE
Protein, ur: NEGATIVE mg/dL
Specific Gravity, Urine: 1.005 (ref 1.005–1.030)
pH: 6 (ref 5.0–8.0)

## 2023-05-10 LAB — MAGNESIUM: Magnesium: 1.7 mg/dL (ref 1.7–2.4)

## 2023-05-10 LAB — SAMPLE TO BLOOD BANK

## 2023-05-10 LAB — I-STAT CG4 LACTIC ACID, ED: Lactic Acid, Venous: 1 mmol/L (ref 0.5–1.9)

## 2023-05-10 MED ORDER — SODIUM CHLORIDE 0.9% FLUSH
10.0000 mL | INTRAVENOUS | Status: DC | PRN
  Administered 2023-05-10: 10 mL via INTRAVENOUS

## 2023-05-10 MED ORDER — LACTATED RINGERS IV BOLUS
1000.0000 mL | Freq: Once | INTRAVENOUS | Status: AC
  Administered 2023-05-10: 1000 mL via INTRAVENOUS

## 2023-05-10 MED ORDER — HEPARIN SOD (PORK) LOCK FLUSH 100 UNIT/ML IV SOLN
500.0000 [IU] | Freq: Once | INTRAVENOUS | Status: AC
  Administered 2023-05-10: 500 [IU] via INTRAVENOUS

## 2023-05-10 MED ORDER — ACETAMINOPHEN 325 MG PO TABS
650.0000 mg | ORAL_TABLET | Freq: Once | ORAL | Status: AC
  Administered 2023-05-10: 650 mg via ORAL
  Filled 2023-05-10: qty 2

## 2023-05-10 MED ORDER — SODIUM CHLORIDE 0.9 % IV SOLN
500.0000 mg | Freq: Once | INTRAVENOUS | Status: AC
  Administered 2023-05-10: 500 mg via INTRAVENOUS
  Filled 2023-05-10: qty 5

## 2023-05-10 MED ORDER — SODIUM CHLORIDE 0.9 % IV SOLN
2.0000 g | Freq: Once | INTRAVENOUS | Status: AC
  Administered 2023-05-10: 2 g via INTRAVENOUS
  Filled 2023-05-10: qty 12.5

## 2023-05-10 NOTE — ED Triage Notes (Signed)
 General unwell feeling for a few days, worse within the last few hours. C/o fatigue. Pt is not currently on chemo or radiation. Denies pain.

## 2023-05-10 NOTE — H&P (Signed)
 History and Physical    [name] WUJ:[id number] DOB: 08/04/1940 DOA: 05/10/2023  Patient coming from: Home.  Chief Complaint: Weakness.  Fever.  HPI: [name] is a 83 y.o. female with history of AML being followed by Dr. Leonides Schanz at [facility] health and Dr. Lowella Bandy in [facility] recently admitted at [facility] on 04/19/2023 through 04/29/2023 for neutropenic fever with multifocal pneumonia was treated with antibiotics for 10 days has follow-up with oncologist next Monday in 5 days has been progressively getting weaker and today started having subjective feeling of fever chills and was advised by our oncology office to come to the ER.  Patient's sister states that patient had a temperature of 100 F at the house.  Denies any nausea vomiting shortness of breath cough headache dysuria or diarrhea.  Patient has been ambulating with the help of wheelchair since her discharge from [facility] 10 days ago.  ED Course: In the ER patient had temperature of 103 F.  Chest x-ray shows pneumonia.  COVID and flu test are negative.  WBC count is 1 hemoglobin 8.7 platelets 96 neutrophil percentage is 4.  Cultures obtained started on empiric antibiotics for neutropenic fever with pneumonia.  Review of Systems: As per HPI, rest all negative.   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 She has never used smokeless tobacco. She reports current alcohol use. She reports that she does not use drugs.  Allergies  Allergen Reactions   Ambien [Zolpidem]      Intolerant.   Codeine Nausea And Vomiting   Dust Mite Extract Cough   Grass Pollen(K-O-R-T-Swt Vern) Cough   Hydrocodone Nausea Only   Statins     Muscle pain   Sulfa Antibiotics Nausea And Vomiting   Zofran [Ondansetron]     Inc in anxiety and doesn't help with nausea.      Family History  Problem Relation Age of Onset   Heart disease Mother    Diabetes Father    Heart failure Father    COPD Father    Hyperlipidemia Sister    Diabetes Brother    Colon cancer Neg Hx    Breast cancer Neg Hx     Prior to Admission medications   Medication Sig Start Date End Date Taking? Authorizing Provider  acyclovir (ZOVIRAX) 400 MG tablet Take 400 mg by mouth 2 (two) times daily. 08/24/22  Yes [provider]  ALPRAZolam Prudy Feeler) 0.25 MG tablet TAKE 1 TABLET (0.25 MG TOTAL) BY MOUTH DAILY AS NEEDED. 10/22/22  Yes [name], MD  Cyanocobalamin (VITAMIN B-12 PO) Take 1,000 mcg by mouth daily.   Yes [provider]  levofloxacin (LEVAQUIN) 250 MG tablet Take 250 mg by mouth daily. 12/21/22 12/21/23 Yes [provider]  levothyroxine (SYNTHROID) 50 MCG tablet TAKE 1 TABLET (50 MCG TOTAL) BY MOUTH DAILY. 12/25/22  Yes [name], MD  losartan (COZAAR) 25 MG tablet Take 1 tablet (  25 mg total) by mouth daily. 04/04/23 07/03/23 Yes Jodelle Red, MD  metoprolol succinate (TOPROL-XL) 25 MG 24 hr tablet Take 1 tablet by mouth daily. 09/18/22  Yes [provider]  mirtazapine (REMERON) 15 MG tablet Take 15 mg by mouth at bedtime.   Yes [provider]  polycarbophil (FIBERCON) 625 MG tablet Take 625 mg by mouth daily.   Yes [provider]  posaconazole (NOXAFIL) 100 MG TBEC delayed-release tablet Take 300 mg by mouth daily.   Yes [provider]  prochlorperazine (COMPAZINE) 5 MG tablet Take by mouth. 08/24/22  Yes [provider]  spironolactone (ALDACTONE) 25 MG tablet Take by mouth. 09/18/22  Yes [provider]  zinc sulfate 220 (50 Zn) MG capsule Take 220 mg by mouth daily.   Yes [provider]  furosemide (LASIX) 20 MG tablet Take 20 mg by mouth daily as needed for fluid. Prn for weight gain >5 lbs Patient not taking: Reported on 05/10/2023 08/31/22   [provider]    Physical Exam: Constitutional: Moderately built and nourished. Vitals:   05/10/23 1949 05/10/23 1953 05/10/23 2100 05/10/23 2215  BP: 138/75  138/61 (!) 126/56  Pulse: 80  69 67  Resp: 20  20 15   Temp: (!) 103.2 F (39.6 C)   98.1 F (36.7 C)  TempSrc: Oral   Oral  SpO2: 99%  98% 97%  Weight:  50.8 kg    Height:  5\' 4"  (1.626 m)     Eyes: Anicteric no pallor. ENMT: No discharge from the ears eyes nose or mouth. Neck: No mass felt.  No neck rigidity. Respiratory: No rhonchi or crepitations. Cardiovascular: S1-S2 heard. Abdomen: Soft nontender bowel sound present. Musculoskeletal: No edema. Skin: No rash. Neurologic: Alert awake oriented to time place and person.  Moves all extremities. Psychiatric: Appears normal.  Normal affect.   Labs on Admission: I have personally reviewed following labs and imaging studies  CBC: Recent Labs  Lab 05/07/23 0748 05/10/23 0743 05/10/23 2014  WBC 0.9* 1.0* 1.0*  NEUTROABS 0.1* 0.1* 0.0*  HGB 10.0* 9.1* 8.7*  HCT 31.0* 28.3* 27.7*  MCV 91.2 92.8 94.5  PLT 117* 110* 96*   Basic Metabolic Panel: Recent Labs  Lab 05/07/23 0748 05/10/23 0743 05/10/23 2014  NA 139 138 135  K 4.1 3.8 3.8  CL 102 101 101  CO2 27 27 24   GLUCOSE 134* 121* 115*  BUN 23 14 16   CREATININE 1.02* 0.78 0.79  CALCIUM 9.5 9.1 8.7*  MG 1.8 1.7  --    GFR: Estimated Creatinine Clearance: 45 mL/min (by C-G formula based on SCr of 0.79 mg/dL). Liver Function Tests: Recent Labs  Lab 05/07/23 0748 05/10/23 0743 05/10/23 2014  AST 40 30 34  ALT 48* 39 39  ALKPHOS 153* 140* 121  BILITOT 1.0 1.3* 1.3*  PROT 7.2 7.0 6.6  ALBUMIN 3.4* 3.3* 2.6*   No results for input(s):  "LIPASE", "AMYLASE" in the last 168 hours. No results for input(s): "AMMONIA" in the last 168 hours. Coagulation Profile: No results for input(s): "INR", "PROTIME" in the last 168 hours. Cardiac Enzymes: No results for input(s): "CKTOTAL", "CKMB", "CKMBINDEX", "TROPONINI" in the last 168 hours. BNP (last 3 results) No results for input(s): "PROBNP" in the last 8760 hours. HbA1C: No results for input(s): "HGBA1C" in the last 72 hours. CBG: Recent Labs  Lab 05/07/23 0916  GLUCAP 112*   Lipid Profile: No results for input(s): "CHOL", "HDL", "LDLCALC", "TRIG", "CHOLHDL", "LDLDIRECT" in the  last 72 hours. Thyroid Function Tests: No results for input(s): "TSH", "T4TOTAL", "FREET4", "T3FREE", "THYROIDAB" in the last 72 hours. Anemia Panel: No results for input(s): "VITAMINB12", "FOLATE", "FERRITIN", "TIBC", "IRON", "RETICCTPCT" in the last 72 hours. Urine analysis:    Component Value Date/Time   COLORURINE YELLOW 05/10/2023 2328   APPEARANCEUR CLEAR 05/10/2023 2328   LABSPEC 1.005 05/10/2023 2328   PHURINE 6.0 05/10/2023 2328   GLUCOSEU NEGATIVE 05/10/2023 2328   GLUCOSEU NEGATIVE 08/08/2022 1618   HGBUR NEGATIVE 05/10/2023 2328   BILIRUBINUR NEGATIVE 05/10/2023 2328   KETONESUR NEGATIVE 05/10/2023 2328   PROTEINUR NEGATIVE 05/10/2023 2328   UROBILINOGEN 1.0 08/08/2022 1618   NITRITE NEGATIVE 05/10/2023 2328   LEUKOCYTESUR NEGATIVE 05/10/2023 2328   Sepsis Labs: @LABRCNTIP (procalcitonin:4,lacticidven:4) ) Recent Results (from the past 240 hours)  Culture, blood (routine x 2)     Status: None (Preliminary result)   Collection Time: 05/07/23  9:26 AM   Specimen: BLOOD  Result Value Ref Range Status   Specimen Description   Final    BLOOD SITE NOT SPECIFIED Performed at [facility], [address] [place], [place] [address]    Special Requests   Final    BOTTLES DRAWN AEROBIC AND ANAEROBIC Blood Culture results may not be optimal due to an inadequate  volume of blood received in culture bottles Performed at [facility], [address] [place], [place] [address]    Culture   Final    NO GROWTH 3 DAYS Performed at [facility], [address], [place], [place] [address]    Report Status PENDING  Incomplete  Resp panel by RT-PCR (RSV, Flu A&B, Covid) Anterior Nasal Swab     Status: None   Collection Time: 05/07/23  9:27 AM   Specimen: Anterior Nasal Swab  Result Value Ref Range Status   SARS Coronavirus 2 by RT PCR NEGATIVE NEGATIVE Final    Comment: (NOTE) SARS-CoV-2 target nucleic acids are NOT DETECTED.  The SARS-CoV-2 RNA is generally detectable in upper respiratory specimens during the acute phase of infection. The lowest concentration of SARS-CoV-2 viral copies this assay can detect is 138 copies/mL. A negative result does not preclude SARS-Cov-2 infection and should not be used as the sole basis for treatment or other patient management decisions. A negative result may occur with  improper specimen collection/handling, submission of specimen other than nasopharyngeal swab, presence of viral mutation(s) within the areas targeted by this assay, and inadequate number of viral copies(<138 copies/mL). A negative result must be combined with clinical observations, patient history, and epidemiological information. The expected result is Negative.  Fact Sheet for Patients:  BloggerCourse.com  Fact Sheet for Healthcare Providers:  SeriousBroker.it  This test is no t yet approved or cleared by the [place] FDA and  has been authorized for detection and/or diagnosis of SARS-CoV-2 by FDA under an Emergency Use Authorization (EUA). This EUA will remain  in effect (meaning this test can be used) for the duration of the COVID-19 declaration under Section 564(b)(1) of the Act, 21 U.S.C.section 360bbb-3(b)(1), unless the authorization is terminated  or  revoked sooner.       Influenza A by PCR NEGATIVE NEGATIVE Final   Influenza B by PCR NEGATIVE NEGATIVE Final    Comment: (NOTE) The Xpert Xpress SARS-CoV-2/FLU/RSV plus assay is intended as an aid in the diagnosis of influenza from Nasopharyngeal swab specimens and should not be used as a sole basis for treatment. Nasal washings and aspirates are unacceptable  for Xpert Xpress SARS-CoV-2/FLU/RSV testing.  Fact Sheet for Patients: BloggerCourse.com  Fact Sheet for Healthcare Providers: SeriousBroker.it  This test is not yet approved or cleared by the [place] FDA and has been authorized for detection and/or diagnosis of SARS-CoV-2 by FDA under an Emergency Use Authorization (EUA). This EUA will remain in effect (meaning this test can be used) for the duration of the COVID-19 declaration under Section 564(b)(1) of the Act, 21 U.S.C. section 360bbb-3(b)(1), unless the authorization is terminated or revoked.     Resp Syncytial Virus by PCR NEGATIVE NEGATIVE Final    Comment: (NOTE) Fact Sheet for Patients: BloggerCourse.com  Fact Sheet for Healthcare Providers: SeriousBroker.it  This test is not yet approved or cleared by the [place] FDA and has been authorized for detection and/or diagnosis of SARS-CoV-2 by FDA under an Emergency Use Authorization (EUA). This EUA will remain in effect (meaning this test can be used) for the duration of the COVID-19 declaration under Section 564(b)(1) of the Act, 21 U.S.C. section 360bbb-3(b)(1), unless the authorization is terminated or revoked.  Performed at [facility], [address] [place], [place] [address]   Resp panel by RT-PCR (RSV, Flu A&B, Covid) Anterior Nasal Swab     Status: None   Collection Time: 05/10/23  9:04 PM   Specimen: Anterior Nasal Swab  Result Value Ref Range Status   SARS  Coronavirus 2 by RT PCR NEGATIVE NEGATIVE Final    Comment: (NOTE) SARS-CoV-2 target nucleic acids are NOT DETECTED.  The SARS-CoV-2 RNA is generally detectable in upper respiratory specimens during the acute phase of infection. The lowest concentration of SARS-CoV-2 viral copies this assay can detect is 138 copies/mL. A negative result does not preclude SARS-Cov-2 infection and should not be used as the sole basis for treatment or other patient management decisions. A negative result may occur with  improper specimen collection/handling, submission of specimen other than nasopharyngeal swab, presence of viral mutation(s) within the areas targeted by this assay, and inadequate number of viral copies(<138 copies/mL). A negative result must be combined with clinical observations, patient history, and epidemiological information. The expected result is Negative.  Fact Sheet for Patients:  BloggerCourse.com  Fact Sheet for Healthcare Providers:  SeriousBroker.it  This test is no t yet approved or cleared by the [place] FDA and  has been authorized for detection and/or diagnosis of SARS-CoV-2 by FDA under an Emergency Use Authorization (EUA). This EUA will remain  in effect (meaning this test can be used) for the duration of the COVID-19 declaration under Section 564(b)(1) of the Act, 21 U.S.C.section 360bbb-3(b)(1), unless the authorization is terminated  or revoked sooner.       Influenza A by PCR NEGATIVE NEGATIVE Final   Influenza B by PCR NEGATIVE NEGATIVE Final    Comment: (NOTE) The Xpert Xpress SARS-CoV-2/FLU/RSV plus assay is intended as an aid in the diagnosis of influenza from Nasopharyngeal swab specimens and should not be used as a sole basis for treatment. Nasal washings and aspirates are unacceptable for Xpert Xpress SARS-CoV-2/FLU/RSV testing.  Fact Sheet for  Patients: BloggerCourse.com  Fact Sheet for Healthcare Providers: SeriousBroker.it  This test is not yet approved or cleared by the [place] FDA and has been authorized for detection and/or diagnosis of SARS-CoV-2 by FDA under an Emergency Use Authorization (EUA). This EUA will remain in effect (meaning this test can be used) for the duration of the COVID-19 declaration under Section 564(b)(1) of the Act, 21 U.S.C. section 360bbb-3(b)(1),  unless the authorization is terminated or revoked.     Resp Syncytial Virus by PCR NEGATIVE NEGATIVE Final    Comment: (NOTE) Fact Sheet for Patients: BloggerCourse.com  Fact Sheet for Healthcare Providers: SeriousBroker.it  This test is not yet approved or cleared by the [place] FDA and has been authorized for detection and/or diagnosis of SARS-CoV-2 by FDA under an Emergency Use Authorization (EUA). This EUA will remain in effect (meaning this test can be used) for the duration of the COVID-19 declaration under Section 564(b)(1) of the Act, 21 U.S.C. section 360bbb-3(b)(1), unless the authorization is terminated or revoked.  Performed at [facility], [address] [place], [place] [address]      Radiological Exams on Admission: DG Chest 2 View Result Date: 05/10/2023 CLINICAL DATA:  Chemo patient, fever EXAM: CHEST - 2 VIEW COMPARISON:  05/07/2023 FINDINGS: Right chest wall Port-A-Cath tip at the superior cavoatrial junction. Stable cardiomediastinal silhouette. Patchy airspace opacities in the right upper lobe suspicious for pneumonia. No pleural effusion or pneumothorax. No displaced rib fractures. IMPRESSION: Right upper lobe pneumonia. Follow-up in 6-8 weeks is recommended to ensure resolution. Electronically Signed   By: Minerva Fester M.D.   On: 05/10/2023 21:03    EKG: Independently reviewed.  Sinus  arrhythmia.  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  Follow cultures.  Will get CT chest given the persistent pneumonia.  Patient's right eye looks mildly congested.  But no obvious discharge. 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 Received 1 cycle Vidaza and Venetoclax. Due to concern for possible cardiac issues, therapy changed to Ivosidenib therapy but treatment on hold since 10/19/22 secondary to persistent cytopenias. Last marrow 01/18/23 negative by morphology and FISH but cyto positive for -7 and NGS positive for IDH1. MRD + at 0.33%. Her vosidenib therapy has been on hold since 10/19/22 secondary to low blood counts. She had bone marrow biopsy on3/13/25; showing ~7% blasts in a ~20% cellular marrow concerning for recurrence of disease. Patient to follow-up with primary oncologist, Dr. Lowella Bandy for follow-up.  Will notify Dr. Leonides Schanz. Cardiomyopathy associated with AML chemotherapy initial EF on 08/27/2022 was 20 to 25% has improved since patient being off the medication.  Last EF measured 2 weeks ago was 50 to 55% on 04/19/2023.  Continue with metoprolol spironolactone losartan.  Since patient has febrile neutropenia with pneumonia will need close monitoring IV antibiotics and more than 2 midnight stay.  DVT prophylaxis: Lovenox. Code Status: Full code. Family Communication: Patient's sister. Disposition Plan: Medical floor. Consults called: Will notify oncologist. Admission status: Inpatient.

## 2023-05-10 NOTE — ED Provider Notes (Signed)
 [place] EMERGENCY DEPARTMENT AT [facility] Provider Note   CSN: [id number] Arrival date & time: 05/10/23  1943     History  Chief Complaint  Patient presents with   Weakness    [name] is a 83 y.o. female.  Pt with c/o fever in past day. Indicates generally did not feel well for past few days - indicates recently in ED and received ivf. +nasal congestion, but indicates not unusual for her as hx allergies. Denies sore throat or new or worsening cough. No headache. No neck pain/stiffness. No chest pain or discomfort. No sob. No abd pain or nvd. No dysuria or gu c/o. No extremity pain or swelling. No specific known ill contacts.   The history is provided by the patient, medical records and a relative.  Weakness Associated symptoms: fever   Associated symptoms: no abdominal pain, no chest pain, no cough, no diarrhea, no dysuria, no headaches, no myalgias, no shortness of breath and no vomiting        Home Medications Prior to Admission medications   Medication Sig Start Date End Date Taking? Authorizing Provider  acyclovir (ZOVIRAX) 400 MG tablet Take 400 mg by mouth 2 (two) times daily. 08/24/22   [provider]  ALPRAZolam Prudy Feeler) 0.25 MG tablet TAKE 1 TABLET (0.25 MG TOTAL) BY MOUTH DAILY AS NEEDED. 10/22/22   [name], MD  Cyanocobalamin (VITAMIN B-12 PO) Take 1,000 mcg by mouth daily.    [provider]  furosemide (LASIX) 20 MG tablet Take 20 mg by mouth daily as needed for fluid. Prn for weight gain >5 lbs 08/31/22   [provider]  levofloxacin (LEVAQUIN) 250 MG tablet Take 250 mg by mouth daily. 12/21/22 12/21/23  [provider]  levothyroxine (SYNTHROID) 50 MCG tablet TAKE 1 TABLET (50 MCG TOTAL) BY MOUTH DAILY. 12/25/22   [name], MD  losartan (COZAAR) 25 MG tablet Take 1 tablet (25 mg total) by mouth daily. 04/04/23 07/03/23  Jodelle Red, MD  metoprolol succinate (TOPROL-XL) 25 MG 24 hr  tablet Take 1 tablet by mouth daily. 09/18/22   [provider]  mirtazapine (REMERON) 15 MG tablet Take 15 mg by mouth at bedtime.    [provider]  polycarbophil (FIBERCON) 625 MG tablet Take 625 mg by mouth daily.    [provider]  posaconazole (NOXAFIL) 100 MG TBEC delayed-release tablet Take 300 mg by mouth daily.    [provider]  prochlorperazine (COMPAZINE) 5 MG tablet Take by mouth. 08/24/22   [provider]  spironolactone (ALDACTONE) 25 MG tablet Take by mouth. 09/18/22   [provider]  zinc sulfate 220 (50 Zn) MG capsule Take 220 mg by mouth daily.    [provider]      Allergies    Ambien [zolpidem], Codeine, Dust mite extract, Grass pollen(k-o-r-t-swt vern), Hydrocodone, Statins, Sulfa antibiotics, and Zofran [ondansetron]    Review of Systems   Review of Systems  Constitutional:  Positive for fever.  HENT:  Positive for rhinorrhea. Negative for sinus pain, sore throat and trouble swallowing.   Eyes:  Negative for pain and redness.  Respiratory:  Negative for cough and shortness of breath.   Cardiovascular:  Negative for chest pain.  Gastrointestinal:  Negative for abdominal pain, diarrhea and vomiting.  Genitourinary:  Negative for dysuria and flank pain.  Musculoskeletal:  Negative for back pain, myalgias, neck pain and neck stiffness.  Skin:  Negative for rash.  Neurological:  Positive  for weakness. Negative for headaches.  Psychiatric/Behavioral:  Negative for confusion.     Physical Exam Updated Vital Signs BP (!) 126/56   Pulse 67   Temp 98.1 F (36.7 C) (Oral)   Resp 15   Ht 1.626 m (5\' 4" )   Wt 50.8 kg   LMP 01/19/1992 (Exact Date)   SpO2 97%   BMI 19.22 kg/m  Physical Exam Vitals and nursing note reviewed.  Constitutional:      Appearance: Normal appearance. She is well-developed.  HENT:     Head: Atraumatic.     Comments: No sinus or temporal tenderness.     Nose: Nose normal.      Mouth/Throat:     Mouth: Mucous membranes are moist.  Eyes:     General: No scleral icterus.    Conjunctiva/sclera: Conjunctivae normal.     Pupils: Pupils are equal, round, and reactive to light.  Neck:     Trachea: No tracheal deviation.     Comments: Trachea midline. Thyroid not grossly enlarged or tender. No neck stiffness or rigidity.  Cardiovascular:     Rate and Rhythm: Normal rate and regular rhythm.     Pulses: Normal pulses.     Heart sounds: Normal heart sounds. No murmur heard.    No friction rub. No gallop.  Pulmonary:     Effort: Pulmonary effort is normal. No respiratory distress.     Breath sounds: Normal breath sounds.     Comments: Port site without sign of infection Abdominal:     General: Bowel sounds are normal. There is no distension.     Palpations: Abdomen is soft.     Tenderness: There is no abdominal tenderness. There is no guarding.  Genitourinary:    Comments: No cva tenderness.  Musculoskeletal:        General: No swelling or tenderness.     Cervical back: Normal range of motion and neck supple. No rigidity or tenderness. No muscular tenderness.     Right lower leg: No edema.     Left lower leg: No edema.     Comments: CTLS spine, non tender, aligned, no step off. No focal extremity pain, swelling or tenderness.   Lymphadenopathy:     Cervical: No cervical adenopathy.  Skin:    General: Skin is warm and dry.     Findings: No rash.  Neurological:     Mental Status: She is alert.     Comments: Alert, speech normal. Motor/sens grossly intact bil.   Psychiatric:        Mood and Affect: Mood normal.     ED Results / Procedures / Treatments   Labs (all labs ordered are listed, but only abnormal results are displayed) Results for orders placed or performed during the hospital encounter of 05/10/23  CBC with Differential   Collection Time: 05/10/23  8:14 PM  Result Value Ref Range   WBC 1.0 (LL) 4.0 - 10.5 K/uL   RBC 2.93 (L) 3.87 - 5.11  MIL/uL   Hemoglobin 8.7 (L) 12.0 - 15.0 g/dL   HCT 86.5 (L) 78.4 - 69.6 %   MCV 94.5 80.0 - 100.0 fL   MCH 29.7 26.0 - 34.0 pg   MCHC 31.4 30.0 - 36.0 g/dL   RDW 29.5 (H) 28.4 - 13.2 %   Platelets 96 (L) 150 - 400 K/uL   nRBC 0.0 0.0 - 0.2 %   Neutrophils Relative % 4 %   Neutro Abs 0.0 (LL) 1.7 - 7.7 K/uL  Lymphocytes Relative 92 %   Lymphs Abs 0.9 0.7 - 4.0 K/uL   Monocytes Relative 4 %   Monocytes Absolute 0.0 (L) 0.1 - 1.0 K/uL   Eosinophils Relative 0 %   Eosinophils Absolute 0.0 0.0 - 0.5 K/uL   Basophils Relative 0 %   Basophils Absolute 0.0 0.0 - 0.1 K/uL   Abs Immature Granulocytes 0.00 0.00 - 0.07 K/uL  Comprehensive metabolic panel   Collection Time: 05/10/23  8:14 PM  Result Value Ref Range   Sodium 135 135 - 145 mmol/L   Potassium 3.8 3.5 - 5.1 mmol/L   Chloride 101 98 - 111 mmol/L   CO2 24 22 - 32 mmol/L   Glucose, Bld 115 (H) 70 - 99 mg/dL   BUN 16 8 - 23 mg/dL   Creatinine, Ser 1.61 0.44 - 1.00 mg/dL   Calcium 8.7 (L) 8.9 - 10.3 mg/dL   Total Protein 6.6 6.5 - 8.1 g/dL   Albumin 2.6 (L) 3.5 - 5.0 g/dL   AST 34 15 - 41 U/L   ALT 39 0 - 44 U/L   Alkaline Phosphatase 121 38 - 126 U/L   Total Bilirubin 1.3 (H) 0.0 - 1.2 mg/dL   GFR, Estimated >09 >60 mL/min   Anion gap 10 5 - 15  I-Stat CG4 Lactic Acid   Collection Time: 05/10/23  8:15 PM  Result Value Ref Range   Lactic Acid, Venous 1.0 0.5 - 1.9 mmol/L  Resp panel by RT-PCR (RSV, Flu A&B, Covid) Anterior Nasal Swab   Collection Time: 05/10/23  9:04 PM   Specimen: Anterior Nasal Swab  Result Value Ref Range   SARS Coronavirus 2 by RT PCR NEGATIVE NEGATIVE   Influenza A by PCR NEGATIVE NEGATIVE   Influenza B by PCR NEGATIVE NEGATIVE   Resp Syncytial Virus by PCR NEGATIVE NEGATIVE   DG Chest 2 View Result Date: 05/10/2023 CLINICAL DATA:  Chemo patient, fever EXAM: CHEST - 2 VIEW COMPARISON:  05/07/2023 FINDINGS: Right chest wall Port-A-Cath tip at the superior cavoatrial junction. Stable  cardiomediastinal silhouette. Patchy airspace opacities in the right upper lobe suspicious for pneumonia. No pleural effusion or pneumothorax. No displaced rib fractures. IMPRESSION: Right upper lobe pneumonia. Follow-up in 6-8 weeks is recommended to ensure resolution. Electronically Signed   By: Minerva Fester M.D.   On: 05/10/2023 21:03   DG Chest Portable 1 View Result Date: 05/07/2023 CLINICAL DATA:  ALTERED MENTAL STATUS. EXAM: PORTABLE CHEST 1 VIEW COMPARISON:  05/17/2013. FINDINGS: Bilateral lung fields are clear. Bilateral costophrenic angles are clear. Normal cardio-mediastinal silhouette. No acute osseous abnormalities. The soft tissues are within normal limits. Right-sided CT Port-A-Cath is seen with its tip overlying the cavoatrial junction region. IMPRESSION: No active disease. Electronically Signed   By: Jules Schick M.D.   On: 05/07/2023 11:01     EKG EKG Interpretation Date/Time:  Thursday May 10 2023 19:54:45 EDT Ventricular Rate:  76 PR Interval:    QRS Duration:  77 QT Interval:  447 QTC Calculation: 503 R Axis:   82  Text Interpretation: Sinus arrhythmia Low voltage, extremity and precordial leads Borderline repolarization abnormality Nonspecific T wave abnormality Confirmed by Cathren Laine ([id number]) on 05/10/2023 10:28:57 PM  Radiology DG Chest 2 View Result Date: 05/10/2023 CLINICAL DATA:  Chemo patient, fever EXAM: CHEST - 2 VIEW COMPARISON:  05/07/2023 FINDINGS: Right chest wall Port-A-Cath tip at the superior cavoatrial junction. Stable cardiomediastinal silhouette. Patchy airspace opacities in the right upper lobe suspicious for pneumonia. No pleural effusion  or pneumothorax. No displaced rib fractures. IMPRESSION: Right upper lobe pneumonia. Follow-up in 6-8 weeks is recommended to ensure resolution. Electronically Signed   By: Minerva Fester M.D.   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 Labs: ordered. Decision-making details documented in ED Course. Radiology: ordered and independent interpretation performed. Decision-making details documented in ED Course. ECG/medicine tests: ordered and independent interpretation performed. Decision-making details documented in ED Course. Discussion of management or test interpretation with external provider(s): hospitalists  Risk OTC drugs. Decision regarding hospitalization.   Iv ns. Continuous pulse ox and cardiac monitoring. Labs ordered/sent. Imaging ordered.   Differential diagnosis includes covid/flu, pna, uti, etc. Dispo decision including potential need for admission considered - will get labs and imaging and reassess.   Reviewed nursing notes and prior charts for additional history. External reports reviewed. Additional history from: family.  Cultures sent.  Ivf bolus. Acetaminophen po.   Cardiac monitor: sinus rhythm, rate 80.  Labs reviewed/interpreted by me - neutropenia, chr thrombocytopenia. Cx sent. Maxipime iv. Zithromax iv.   Xrays reviewed/interpreted by me - right pna.   Hospitalists consulted for admission.  CRITICAL CARE RE: febrile neutropenia, acute myeloid leukemia, pancytopenia. Pneumonia.  Performed by: Suzi Roots Total critical care time: 45 minutes Critical care time was exclusive of separately billable procedures and treating other patients. Critical care was necessary to treat or prevent imminent or life-threatening deterioration. Critical care was time spent personally by me on the following activities: development of treatment plan with patient and/or surrogate as well as nursing, discussions with consultants, evaluation of patient's response to treatment, examination of patient, obtaining history from patient or surrogate, ordering and performing treatments and interventions, ordering and review of laboratory studies, ordering and review of radiographic studies, pulse oximetry and re-evaluation of patient's condition.          Final Clinical Impression(s) / ED Diagnoses Final diagnoses:  Acute febrile illness  Pancytopenia (HCC)  Febrile neutropenia (HCC)  Community acquired pneumonia of right upper lobe of lung    Rx / DC Orders ED Discharge Orders     None         Cathren Laine, MD 05/10/23 2229

## 2023-05-10 NOTE — Telephone Encounter (Signed)
 Called to see pt in flush as she was not feeling well. Saw pt. Her sister is with her. [name] states she does not feel well-very weak, fatigue, no appetite. Sh does state that she increased her fluid intake since her last visit here as she was dehydrated and received IV fluids in the ED. VSS. Afebrile. Weight down another lb today. Encouraged to keep up with her fluids, dietary recommendations given. Also advised to have her sister contact Dr. Crecencio Mc office to see if she can be seen there soner than 05/21/23. This RN has contacted Dr. Crecencio Mc office as well.

## 2023-05-11 ENCOUNTER — Encounter (HOSPITAL_COMMUNITY): Payer: Self-pay | Admitting: Internal Medicine

## 2023-05-11 ENCOUNTER — Inpatient Hospital Stay (HOSPITAL_COMMUNITY)

## 2023-05-11 DIAGNOSIS — R509 Fever, unspecified: Secondary | ICD-10-CM

## 2023-05-11 DIAGNOSIS — C9200 Acute myeloblastic leukemia, not having achieved remission: Secondary | ICD-10-CM | POA: Diagnosis not present

## 2023-05-11 LAB — CBC WITH DIFFERENTIAL/PLATELET
Abs Immature Granulocytes: 0 10*3/uL (ref 0.00–0.07)
Basophils Absolute: 0 10*3/uL (ref 0.0–0.1)
Basophils Relative: 0 %
Eosinophils Absolute: 0 10*3/uL (ref 0.0–0.5)
Eosinophils Relative: 0 %
HCT: 23.6 % — ABNORMAL LOW (ref 36.0–46.0)
Hemoglobin: 7.5 g/dL — ABNORMAL LOW (ref 12.0–15.0)
Immature Granulocytes: 0 %
Lymphocytes Relative: 89 %
Lymphs Abs: 0.9 10*3/uL (ref 0.7–4.0)
MCH: 30.2 pg (ref 26.0–34.0)
MCHC: 31.8 g/dL (ref 30.0–36.0)
MCV: 95.2 fL (ref 80.0–100.0)
Monocytes Absolute: 0 10*3/uL — ABNORMAL LOW (ref 0.1–1.0)
Monocytes Relative: 4 %
Neutro Abs: 0.1 10*3/uL — CL (ref 1.7–7.7)
Neutrophils Relative %: 7 %
Platelets: 76 10*3/uL — ABNORMAL LOW (ref 150–400)
RBC: 2.48 MIL/uL — ABNORMAL LOW (ref 3.87–5.11)
RDW: 19.5 % — ABNORMAL HIGH (ref 11.5–15.5)
WBC: 1 10*3/uL — CL (ref 4.0–10.5)
nRBC: 0 % (ref 0.0–0.2)

## 2023-05-11 LAB — RESPIRATORY PANEL BY PCR

## 2023-05-11 LAB — COMPREHENSIVE METABOLIC PANEL WITH GFR
ALT: 33 U/L (ref 0–44)
AST: 25 U/L (ref 15–41)
Albumin: 2.3 g/dL — ABNORMAL LOW (ref 3.5–5.0)
Alkaline Phosphatase: 98 U/L (ref 38–126)
Anion gap: 8 (ref 5–15)
BUN: 14 mg/dL (ref 8–23)
CO2: 25 mmol/L (ref 22–32)
Calcium: 8.4 mg/dL — ABNORMAL LOW (ref 8.9–10.3)
Chloride: 104 mmol/L (ref 98–111)
Creatinine, Ser: 0.64 mg/dL (ref 0.44–1.00)
GFR, Estimated: >60 mL/min (ref >60)
Glucose, Bld: 106 mg/dL — ABNORMAL HIGH (ref 70–99)
Potassium: 3.3 mmol/L — ABNORMAL LOW (ref 3.5–5.1)
Sodium: 137 mmol/L (ref 135–145)
Total Bilirubin: 1.4 mg/dL — ABNORMAL HIGH (ref 0.0–1.2)
Total Protein: 6 g/dL — ABNORMAL LOW (ref 6.5–8.1)

## 2023-05-11 LAB — PATHOLOGIST SMEAR REVIEW: Path Review: NEGATIVE

## 2023-05-11 MED ORDER — VANCOMYCIN HCL 750 MG/150ML IV SOLN
750.0000 mg | INTRAVENOUS | Status: DC
  Administered 2023-05-11 – 2023-05-15 (×5): 750 mg via INTRAVENOUS
  Filled 2023-05-11 (×5): qty 150

## 2023-05-11 MED ORDER — IOHEXOL 300 MG/ML  SOLN
75.0000 mL | Freq: Once | INTRAMUSCULAR | Status: AC | PRN
  Administered 2023-05-11: 75 mL via INTRAVENOUS

## 2023-05-11 MED ORDER — LOSARTAN POTASSIUM 25 MG PO TABS
25.0000 mg | ORAL_TABLET | Freq: Every day | ORAL | Status: DC
Start: 2023-05-11 — End: 2023-05-11

## 2023-05-11 MED ORDER — ACYCLOVIR 400 MG PO TABS
400.0000 mg | ORAL_TABLET | Freq: Two times a day (BID) | ORAL | Status: DC
  Administered 2023-05-11 – 2023-05-16 (×12): 400 mg via ORAL
  Filled 2023-05-11 (×13): qty 1

## 2023-05-11 MED ORDER — ACETAMINOPHEN 650 MG RE SUPP: 650.0000 mg | Freq: Four times a day (QID) | RECTAL | Status: DC | PRN

## 2023-05-11 MED ORDER — ACETAMINOPHEN 325 MG PO TABS
650.0000 mg | ORAL_TABLET | Freq: Four times a day (QID) | ORAL | Status: DC | PRN
  Administered 2023-05-12 – 2023-05-14 (×2): 650 mg via ORAL
  Filled 2023-05-11 (×2): qty 2

## 2023-05-11 MED ORDER — ENSURE ENLIVE PO LIQD
237.0000 mL | Freq: Two times a day (BID) | ORAL | Status: DC
  Administered 2023-05-11: 237 mL via ORAL

## 2023-05-11 MED ORDER — METOPROLOL SUCCINATE ER 25 MG PO TB24
25.0000 mg | ORAL_TABLET | Freq: Every day | ORAL | Status: DC
  Administered 2023-05-11 – 2023-05-16 (×5): 25 mg via ORAL
  Filled 2023-05-11 (×6): qty 1

## 2023-05-11 MED ORDER — SODIUM CHLORIDE 0.9 % IV SOLN
2.0000 g | Freq: Two times a day (BID) | INTRAVENOUS | Status: DC
  Administered 2023-05-11 – 2023-05-16 (×11): 2 g via INTRAVENOUS
  Filled 2023-05-11 (×11): qty 12.5

## 2023-05-11 MED ORDER — VANCOMYCIN HCL IN DEXTROSE 1-5 GM/200ML-% IV SOLN
1000.0000 mg | Freq: Once | INTRAVENOUS | Status: AC
  Administered 2023-05-11: 1000 mg via INTRAVENOUS
  Filled 2023-05-11: qty 200

## 2023-05-11 MED ORDER — POTASSIUM CHLORIDE CRYS ER 20 MEQ PO TBCR
40.0000 meq | EXTENDED_RELEASE_TABLET | Freq: Once | ORAL | Status: AC
  Administered 2023-05-11: 40 meq via ORAL
  Filled 2023-05-11: qty 2

## 2023-05-11 MED ORDER — POSACONAZOLE 100 MG PO TBEC
300.0000 mg | DELAYED_RELEASE_TABLET | Freq: Every day | ORAL | Status: DC
  Administered 2023-05-11 – 2023-05-16 (×6): 300 mg via ORAL
  Filled 2023-05-11 (×7): qty 3

## 2023-05-11 MED ORDER — SODIUM CHLORIDE 0.9% FLUSH: 10.0000 mL | INTRAVENOUS | Status: DC | PRN

## 2023-05-11 MED ORDER — LEVOTHYROXINE SODIUM 50 MCG PO TABS
50.0000 ug | ORAL_TABLET | Freq: Every day | ORAL | Status: DC
  Administered 2023-05-11 – 2023-05-16 (×6): 50 ug via ORAL
  Filled 2023-05-11 (×6): qty 1

## 2023-05-11 MED ORDER — LEVOFLOXACIN 500 MG PO TABS: 250.0000 mg | ORAL_TABLET | Freq: Every day | ORAL | Status: DC

## 2023-05-11 MED ORDER — SPIRONOLACTONE 25 MG PO TABS
25.0000 mg | ORAL_TABLET | Freq: Every day | ORAL | Status: DC
  Administered 2023-05-11 – 2023-05-16 (×6): 25 mg via ORAL
  Filled 2023-05-11 (×7): qty 1

## 2023-05-11 MED ORDER — ENOXAPARIN SODIUM 40 MG/0.4ML IJ SOSY
40.0000 mg | PREFILLED_SYRINGE | INTRAMUSCULAR | Status: DC
  Administered 2023-05-11 – 2023-05-16 (×6): 40 mg via SUBCUTANEOUS
  Filled 2023-05-11 (×6): qty 0.4

## 2023-05-11 MED ORDER — VITAMIN B-12 1000 MCG PO TABS
1000.0000 ug | ORAL_TABLET | Freq: Every day | ORAL | Status: DC
  Administered 2023-05-11 – 2023-05-16 (×6): 1000 ug via ORAL
  Filled 2023-05-11 (×6): qty 1

## 2023-05-11 MED ORDER — BOOST / RESOURCE BREEZE PO LIQD CUSTOM
1.0000 | Freq: Two times a day (BID) | ORAL | Status: DC
  Administered 2023-05-11 – 2023-05-14 (×5): 1 via ORAL

## 2023-05-11 MED ORDER — SODIUM CHLORIDE (PF) 0.9 % IJ SOLN
INTRAMUSCULAR | Status: AC
  Filled 2023-05-11: qty 50

## 2023-05-11 MED ORDER — CHLORHEXIDINE GLUCONATE CLOTH 2 % EX PADS
6.0000 | MEDICATED_PAD | Freq: Every day | CUTANEOUS | Status: DC
  Administered 2023-05-11 – 2023-05-16 (×5): 6 via TOPICAL

## 2023-05-11 MED ORDER — ZINC SULFATE 220 (50 ZN) MG PO CAPS
220.0000 mg | ORAL_CAPSULE | Freq: Every day | ORAL | Status: DC
  Administered 2023-05-11 – 2023-05-16 (×6): 220 mg via ORAL
  Filled 2023-05-11 (×6): qty 1

## 2023-05-11 MED ORDER — ALPRAZOLAM 0.25 MG PO TABS: 0.2500 mg | ORAL_TABLET | Freq: Every day | ORAL | Status: DC | PRN

## 2023-05-11 NOTE — ED Notes (Signed)
 This Clinical research associate used [employer] for assisting to and from the [facility].

## 2023-05-11 NOTE — Progress Notes (Signed)
 1230:Tele monitor showed Heart rate dropped down between 40-50 few times. Current BP 133/52, Pulse 63. Listened to apical sound full 1 minute HR at 54. Pt asymptomatic and has no c/o chest pain, palpitation or any discomfort. MD notified and made aware of Blood pressure and Heart meds. New order given. Will continue to monitor. Call bell in reach. Bed low and locked.

## 2023-05-11 NOTE — Plan of Care (Signed)

## 2023-05-11 NOTE — Progress Notes (Signed)
 Pharmacy Antibiotic Note  [name] is a 83 y.o. female admitted on 05/10/2023 with fever and weakness.  Pharmacy has been consulted to dose vancomycin and cefepime for pna.  Plan: Vancomycin 1gm IV x 1 then 750mg  q24h (AUC 491.3, Scr 0.8, TBW) Cefepime 2gm IV q12h Follow renal function, cultures and clinical course  Height: 5\' 4"  (162.6 cm) Weight: 50.8 kg (111 lb 15.9 oz) IBW/kg (Calculated) : 54.7  Temp (24hrs), Avg:100.1 F (37.8 C), Min:98.1 F (36.7 C), Max:103.2 F (39.6 C)  Recent Labs  Lab 05/07/23 0748 05/10/23 0743 05/10/23 2014 05/10/23 2015  WBC 0.9* 1.0* 1.0*  --   CREATININE 1.02* 0.78 0.79  --   LATICACIDVEN  --   --   --  1.0    Estimated Creatinine Clearance: 45 mL/min (by C-G formula based on SCr of 0.79 mg/dL).    Allergies  Allergen Reactions   Ambien [Zolpidem]     Intolerant.   Codeine Nausea And Vomiting   Dust Mite Extract Cough   Grass Pollen(K-O-R-T-Swt Vern) Cough   Hydrocodone Nausea Only   Statins     Muscle pain   Sulfa Antibiotics Nausea And Vomiting   Zofran [Ondansetron]     Inc in anxiety and doesn't help with nausea.      Antimicrobials this admission: 4/3 azith x 1 4/3 cefepime >> 4/4 vanc >>  Dose adjustments this admission:   Microbiology results: 4/3 BCx:   Thank you for allowing pharmacy to be a part of this patient's care.  Arley Phenix RPh 05/11/2023, 12:18 AM

## 2023-05-11 NOTE — Progress Notes (Addendum)
 PROGRESS NOTE    [name]  ZOX:[id number] DOB: 25-Nov-1940 DOA: 05/10/2023 PCP: [name], MD   Brief Narrative: 54 with past medical history significant for AML followed by Dr. Leonides Schanz and Dr. Lowella Bandy from [facility], recently admitted at [facility] from 04/19/2023 through 04/29/2023, for neutropenic fever with multifocal pneumonia was treated with 10 days of antibiotics and supposed to have a follow-up with oncologist next Monday.  Patient presented with progressive weakness, developed subjective fever.  Had a temperature of 100 at home.  Evaluation in the ED patient was found to have a temperature of 103, white count of 1.  Admitted for recurrent neutropenic fever.   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  -Continue with broad spectrum antibiotics: Vancomycin and cefepime.  -IV fluids.  -Follow Blood Culture.  -Check Respiratory panel.  -Covid, Influenza, RSV negative.  -Oncology has been consulted.   AML Pancytopenia -She had bone marrow biopsy on3/13/25; showing ~7% blasts in a ~20% cellular marrow concerning for recurrence of disease. Patient to follow-up with primary oncologist, Dr. Lowella Bandy on Monday. Dr Mosetta Putt will see patient in consultation. Follow recommendations.  Monitor platelet count. Hb 7.5 --follow oncology recommendations in regards transfusion   Cardiomyopathy associated chemotherapy,  ejection fraction July 20 24--25%.  Has improved since that to 55% hide 2D echo 04/19/2023. -On metoprolol, spironolactone and  hold losartan.    Hypokalemia: Replaced orally.    Bradycardia; Holder parameter for metoprolol added.    Estimated body mass index is 19.22 kg/m as calculated from the following:   Height as of this encounter: 5\' 4"  (1.626 m).   Weight as of this encounter: 50.8 kg.   DVT prophylaxis: lovenox Code Status: Full code Family Communication: Disposition Plan:  Status is: Inpatient Remains inpatient appropriate because: management of neutropenic fever.     Consultants:  Oncology   Procedures:    Antimicrobials:    Subjective: She report mild cough. She was having weakness generalized and fevers at home. Denies diarrhea, denies abdominal pain.   Objective: Vitals:   05/10/23 2215 05/11/23 0200 05/11/23 0405 05/11/23 0700  BP: (!) 126/56 (!) 122/56 125/67 (!) 128/46  Pulse: 67 (!) 54 60 (!) 55  Resp: 15 16 20 19   Temp: 98.1 F (36.7 C)  97.7 F (36.5 C)   TempSrc: Oral  Oral   SpO2: 97% 99% 100% 96%  Weight:      Height:        Intake/Output Summary (Last 24 hours) at 05/11/2023 0803 Last data filed at 05/11/2023 0208 Gross per 24 hour  Intake 1550 ml  Output --  Net 1550 ml   Filed Weights   05/10/23 1953  Weight: 50.8 kg    Examination:  General exam: Appears calm and comfortable  Respiratory system: Clear to auscultation. Respiratory effort normal. Cardiovascular system: S1 & S2 heard, RRR.  Gastrointestinal system: Abdomen is nondistended, soft and nontender. No organomegaly or masses felt. Normal bowel sounds heard. Central nervous system: Alert and oriented.  Extremities: Symmetric 5 x 5 power.    Data Reviewed: I have personally reviewed following labs and imaging studies  CBC: Recent Labs  Lab 05/07/23 0748 05/10/23 0743 05/10/23 2014 05/11/23 0417  WBC 0.9* 1.0* 1.0* 1.0*  NEUTROABS 0.1* 0.1* 0.0* 0.1*  HGB 10.0* 9.1* 8.7* 7.5*  HCT 31.0* 28.3* 27.7* 23.6*  MCV 91.2 92.8 94.5 95.2  PLT 117* 110* 96* 76*   Basic Metabolic Panel: Recent Labs  Lab 05/07/23 0748 05/10/23 0743  05/10/23 2014 05/11/23 0417  NA 139 138 135 137  K 4.1 3.8 3.8 3.3*  CL 102 101 101 104  CO2 27 27 24 25   GLUCOSE 134* 121* 115* 106*  BUN 23 14 16 14   CREATININE 1.02* 0.78 0.79 0.64  CALCIUM 9.5 9.1 8.7* 8.4*  MG 1.8 1.7  --   --    GFR: Estimated Creatinine Clearance: 45 mL/min (by C-G formula based on SCr of 0.64 mg/dL). Liver Function Tests: Recent Labs  Lab 05/07/23 0748 05/10/23 0743 05/10/23 2014 05/11/23 0417  AST 40 30 34 25  ALT 48* 39 39 33  ALKPHOS 153* 140* 121 98  BILITOT 1.0 1.3* 1.3* 1.4*  PROT 7.2 7.0 6.6 6.0*  ALBUMIN 3.4* 3.3* 2.6* 2.3*   No results for input(s): "LIPASE", "AMYLASE" in the last 168 hours. No results for input(s): "AMMONIA" in the last 168 hours. Coagulation Profile: No results for input(s): "INR", "PROTIME" in the last 168 hours. Cardiac Enzymes: No results for input(s): "CKTOTAL", "CKMB", "CKMBINDEX", "TROPONINI" in the last 168 hours. BNP (last 3 results) No results for input(s): "PROBNP" in the last 8760 hours. HbA1C: No results for input(s): "HGBA1C" in the last 72 hours. CBG: Recent Labs  Lab 05/07/23 0916  GLUCAP 112*   Lipid Profile: No results for input(s): "CHOL", "HDL", "LDLCALC", "TRIG", "CHOLHDL", "LDLDIRECT" in the last 72 hours. Thyroid Function Tests: No results for input(s): "TSH", "T4TOTAL", "FREET4", "T3FREE", "THYROIDAB" in the last 72 hours. Anemia Panel: No results for input(s): "VITAMINB12", "FOLATE", "FERRITIN", "TIBC", "IRON", "RETICCTPCT" in the last 72 hours. Sepsis Labs: Recent Labs  Lab 05/10/23 2015  LATICACIDVEN 1.0    Recent Results (from the past 240 hours)  Culture, blood (routine x 2)     Status: None (Preliminary result)   Collection Time: 05/07/23  9:26 AM   Specimen: BLOOD  Result Value Ref Range Status   Specimen Description   Final    BLOOD SITE NOT SPECIFIED Performed at [facility], [address] [place], [place] [address]    Special Requests   Final     BOTTLES DRAWN AEROBIC AND ANAEROBIC Blood Culture results may not be optimal due to an inadequate volume of blood received in culture bottles Performed at [facility], [address], [place], [place] [address]    Culture   Final    NO GROWTH 3 DAYS Performed at [facility], [address], [place], [place] [address]    Report Status PENDING  Incomplete  Resp panel by RT-PCR (RSV, Flu A&B, Covid) Anterior Nasal Swab     Status: None   Collection Time: 05/07/23  9:27 AM   Specimen: Anterior Nasal Swab  Result Value Ref Range Status   SARS Coronavirus 2 by RT PCR NEGATIVE NEGATIVE Final    Comment: (NOTE) SARS-CoV-2 target nucleic acids are NOT DETECTED.  The SARS-CoV-2 RNA is generally detectable in upper respiratory specimens during the acute phase of infection. The lowest concentration of SARS-CoV-2 viral copies this assay can detect is 138 copies/mL. A negative result does not preclude SARS-Cov-2 infection and should not be used  as the sole basis for treatment or other patient management decisions. A negative result may occur with  improper specimen collection/handling, submission of specimen other than nasopharyngeal swab, presence of viral mutation(s) within the areas targeted by this assay, and inadequate number of viral copies(<138 copies/mL). A negative result must be combined with clinical observations, patient history, and epidemiological information. The expected result is Negative.  Fact Sheet for Patients:  BloggerCourse.com  Fact Sheet for Healthcare Providers:  SeriousBroker.it  This test is no t yet approved or cleared by the [place] FDA and  has been authorized for detection and/or diagnosis of SARS-CoV-2 by FDA under an Emergency Use Authorization (EUA). This EUA will remain  in effect (meaning this test can be used) for the duration of the COVID-19 declaration under Section  564(b)(1) of the Act, 21 U.S.C.section 360bbb-3(b)(1), unless the authorization is terminated  or revoked sooner.       Influenza A by PCR NEGATIVE NEGATIVE Final   Influenza B by PCR NEGATIVE NEGATIVE Final    Comment: (NOTE) The Xpert Xpress SARS-CoV-2/FLU/RSV plus assay is intended as an aid in the diagnosis of influenza from Nasopharyngeal swab specimens and should not be used as a sole basis for treatment. Nasal washings and aspirates are unacceptable for Xpert Xpress SARS-CoV-2/FLU/RSV testing.  Fact Sheet for Patients: BloggerCourse.com  Fact Sheet for Healthcare Providers: SeriousBroker.it  This test is not yet approved or cleared by the [place] FDA and has been authorized for detection and/or diagnosis of SARS-CoV-2 by FDA under an Emergency Use Authorization (EUA). This EUA will remain in effect (meaning this test can be used) for the duration of the COVID-19 declaration under Section 564(b)(1) of the Act, 21 U.S.C. section 360bbb-3(b)(1), unless the authorization is terminated or revoked.     Resp Syncytial Virus by PCR NEGATIVE NEGATIVE Final    Comment: (NOTE) Fact Sheet for Patients: BloggerCourse.com  Fact Sheet for Healthcare Providers: SeriousBroker.it  This test is not yet approved or cleared by the [place] FDA and has been authorized for detection and/or diagnosis of SARS-CoV-2 by FDA under an Emergency Use Authorization (EUA). This EUA will remain in effect (meaning this test can be used) for the duration of the COVID-19 declaration under Section 564(b)(1) of the Act, 21 U.S.C. section 360bbb-3(b)(1), unless the authorization is terminated or revoked.  Performed at [facility], [address], [place], [place] [address]   Resp panel by RT-PCR (RSV, Flu A&B, Covid) Anterior Nasal Swab     Status: None   Collection Time:  05/10/23  9:04 PM   Specimen: Anterior Nasal Swab  Result Value Ref Range Status   SARS Coronavirus 2 by RT PCR NEGATIVE NEGATIVE Final    Comment: (NOTE) SARS-CoV-2 target nucleic acids are NOT DETECTED.  The SARS-CoV-2 RNA is generally detectable in upper respiratory specimens during the acute phase of infection. The lowest concentration of SARS-CoV-2 viral copies this assay can detect is 138 copies/mL. A negative result does not preclude SARS-Cov-2 infection and should not be used as the sole basis for treatment or other patient management decisions. A negative result may occur with  improper specimen collection/handling, submission of specimen other than nasopharyngeal swab, presence of viral mutation(s) within the areas targeted by this assay, and inadequate number of viral copies(<138 copies/mL). A negative result must be combined with clinical observations, patient history, and epidemiological information. The expected result is Negative.  Fact Sheet for Patients:  BloggerCourse.com  Fact Sheet for Healthcare Providers:  SeriousBroker.it  This test is no t yet approved or cleared by the [place] and  has been authorized for detection and/or diagnosis of SARS-CoV-2 by FDA under an Emergency Use Authorization (EUA). This EUA will remain  in effect (meaning this test can be used) for the duration of the COVID-19 declaration under Section 564(b)(1) of the Act, 21 U.S.C.section 360bbb-3(b)(1), unless the authorization is terminated  or revoked sooner.       Influenza A by PCR NEGATIVE NEGATIVE Final   Influenza B by PCR NEGATIVE NEGATIVE Final    Comment: (NOTE) The Xpert Xpress SARS-CoV-2/FLU/RSV plus assay is intended as an aid in the diagnosis of influenza from Nasopharyngeal swab specimens and should not be used as a sole basis for treatment. Nasal washings and aspirates are unacceptable for Xpert Xpress  SARS-CoV-2/FLU/RSV testing.  Fact Sheet for Patients: BloggerCourse.com  Fact Sheet for Healthcare Providers: SeriousBroker.it  This test is not yet approved or cleared by the [place] FDA and has been authorized for detection and/or diagnosis of SARS-CoV-2 by FDA under an Emergency Use Authorization (EUA). This EUA will remain in effect (meaning this test can be used) for the duration of the COVID-19 declaration under Section 564(b)(1) of the Act, 21 U.S.C. section 360bbb-3(b)(1), unless the authorization is terminated or revoked.     Resp Syncytial Virus by PCR NEGATIVE NEGATIVE Final    Comment: (NOTE) Fact Sheet for Patients: BloggerCourse.com  Fact Sheet for Healthcare Providers: SeriousBroker.it  This test is not yet approved or cleared by the [place] FDA and has been authorized for detection and/or diagnosis of SARS-CoV-2 by FDA under an Emergency Use Authorization (EUA). This EUA will remain in effect (meaning this test can be used) for the duration of the COVID-19 declaration under Section 564(b)(1) of the Act, 21 U.S.C. section 360bbb-3(b)(1), unless the authorization is terminated or revoked.  Performed at [facility], [address], [place], [place] [address]          Radiology Studies: CT CHEST W CONTRAST Result Date: 05/11/2023 CLINICAL DATA:  Pneumonia, complication suspected, general unwell feeling for a few days and worsening over the last few hours. History of AML. Ex-smoker. EXAM: CT CHEST WITH CONTRAST TECHNIQUE: Multidetector CT imaging of the chest was performed during intravenous contrast administration. RADIATION DOSE REDUCTION: This exam was performed according to the departmental dose-optimization program which includes automated exposure control, adjustment of the mA and/or kV according to patient size and/or use of  iterative reconstruction technique. CONTRAST:  75mL OMNIPAQUE IOHEXOL 300 MG/ML  SOLN COMPARISON:  Chest radiograph 05/10/2023 and CTA chest 08/09/2022 FINDINGS: Cardiovascular: No pericardial effusion. Right chest wall Port-A-Cath tip the in the low SVC. Normal caliber aorta. Mild aortic atherosclerotic calcification. Mediastinum/Nodes: Trachea and esophagus are unremarkable. 1.1 cm precarinal node on series 2/image 25 is favored reactive. No additional enlarged lymph nodes. Lungs/Pleura: Patchy consolidative and airspace opacities in the bilateral upper and lower lobes suspicious for multifocal pneumonia. No pleural effusion or pneumothorax. Upper Abdomen: No acute abnormality. Musculoskeletal: No acute fracture. IMPRESSION: 1. Multifocal pneumonia. Follow-up in 6-8 weeks after treatment is recommended to ensure resolution. 2. Aortic Atherosclerosis (ICD10-I70.0). Electronically Signed   By: Minerva Fester M.D.   On: 05/11/2023 02:10   DG Chest 2 View Result Date: 05/10/2023 CLINICAL DATA:  Chemo patient, fever EXAM: CHEST - 2 VIEW COMPARISON:  05/07/2023 FINDINGS: Right chest wall Port-A-Cath tip at the superior cavoatrial junction. Stable cardiomediastinal silhouette. Patchy airspace opacities in the right upper  lobe suspicious for pneumonia. No pleural effusion or pneumothorax. No displaced rib fractures. IMPRESSION: Right upper lobe pneumonia. Follow-up in 6-8 weeks is recommended to ensure resolution. Electronically Signed   By: Minerva Fester M.D.   On: 05/10/2023 21:03        Scheduled Meds:  acyclovir  400 mg Oral BID   vitamin B-12  1,000 mcg Oral Daily   enoxaparin (LOVENOX) injection  40 mg Subcutaneous Q24H   levofloxacin  250 mg Oral Daily   levothyroxine  50 mcg Oral Q0600   losartan  25 mg Oral Daily   metoprolol succinate  25 mg Oral Daily   posaconazole  300 mg Oral Daily   spironolactone  25 mg Oral Daily   zinc sulfate (50mg  elemental zinc)  220 mg Oral Daily   Continuous  Infusions:  ceFEPime (MAXIPIME) IV     [START ON 05/12/2023] vancomycin       LOS: 1 day    Time spent: 35 minutes    Mazzie Brodrick A Olivia Royse, MD [facility]   If 7PM-7AM, please contact night-coverage [email or web address]  05/11/2023, 8:03 AM

## 2023-05-11 NOTE — Progress Notes (Addendum)
 [address] [place]   DOB:1940-05-26   WU#:[id number]      ASSESSMENT & PLAN:  History of AML, recurrent - Patient also follows with [facility] and was recently admitted there from 04/19/2023 to 04/29/2023 for neutropenic fever with pneumonia. - Bone marrow biopsy done during above visit showed 7% blast in the bone marrow with 20% cellular marrow consistent with recurrent disease. - Last seen in outpatient oncology office by Dr. Leonides Schanz on 05/03/2023. - Supportive care for transfusions provided at [facility] with twice weekly lab checks and Q8 clinic visits. - Recommendations for AML treatment from Dr. Lowella Bandy at [facility]. - Medical oncology/Dr. Leonides Schanz following.  In Dr. Derek Mound absence today Dr. Mosetta Putt will see patient.  Generalized weakness Neutropenic fever -As previously stated patient was hospitalized at [facility] x 10 days in March for same. - WBC remains low 1.0 with ANC 0.1 today - Temp elevated with range 97.7-103.2 within the last 24 hours.  Most recent temp 99.1. - Continue antibiotics as ordered  Pancytopenia Anemia - Hemoglobin low 7.5 today - Baseline range appears to be in the 8-11 range - Transfuse PRBC for Hgb <7.0 - Continue to monitor CBC with differential closely  Thrombocytopenia - Platelets low 76K today - Baseline appears to be in the low to upper 100 range - No transfusion or intervention required at this time  Code Status Full  Subjective:  Patient seen awake and alert laying supine in bed.  Details her medical history as noted above, that she comes to see Dr. Leonides Schanz twice a week for labs and goes to Dr. Lowella Bandy at [facility].  States she continues to feel weak and has been hot on and off.  No shortness of breath is noted.  No complaints of GI symptoms.  Denies acute bleeding.  Objective:  Vitals:   05/11/23 0845 05/11/23 0918  BP: (!) 156/87 (!) 155/78  Pulse: 65 69  Resp: 17 18  Temp: 98.7 F (37.1 C) 99.1 F (37.3 C)  SpO2: 97% 100%      Intake/Output Summary (Last 24 hours) at 05/11/2023 1032 Last data filed at 05/11/2023 0957 Gross per 24 hour  Intake 1910 ml  Output --  Net 1910 ml     REVIEW OF SYSTEMS:   Constitutional: + Generalized weakness, + fevers,  Eyes: Denies blurriness of vision, double vision or watery eyes Ears, nose, mouth, throat, and face: Denies mucositis or sore throat Respiratory: Denies cough, dyspnea or wheezes Cardiovascular: Denies palpitation, chest discomfort or lower extremity swelling Gastrointestinal:  Denies nausea, heartburn or change in bowel habits Skin: Denies abnormal skin rashes Lymphatics: Denies new lymphadenopathy or easy bruising Neurological: Denies numbness, tingling or new weaknesses Behavioral/Psych: Mood is stable, no new changes  All other systems were reviewed with the patient and are negative.  PHYSICAL EXAMINATION: ECOG PERFORMANCE STATUS: 3 - Symptomatic, >50% confined to bed  Vitals:   05/11/23 0845 05/11/23 0918  BP: (!) 156/87 (!) 155/78  Pulse: 65 69  Resp: 17 18  Temp: 98.7 F (37.1 C) 99.1 F (37.3 C)  SpO2: 97% 100%   Filed Weights   05/10/23 1953  Weight: 111 lb 15.9 oz (50.8 kg)    GENERAL: alert, no distress and comfortable SKIN: + Pale skin color, texture, turgor are normal, no rashes or significant lesions EYES: normal, conjunctiva are pink and non-injected, sclera clear OROPHARYNX: no exudate, no erythema and lips, buccal mucosa, and tongue normal  NECK: supple, thyroid normal size, non-tender, without nodularity LYMPH: no palpable  lymphadenopathy in the cervical, axillary or inguinal LUNGS: clear to auscultation and percussion with normal breathing effort HEART: regular rate & rhythm and no murmurs and no lower extremity edema ABDOMEN: abdomen soft, non-tender and normal bowel sounds MUSCULOSKELETAL: no cyanosis of digits and no clubbing  PSYCH: alert & oriented x 3 with fluent speech NEURO: no focal motor/sensory deficits   All  questions were answered. The patient knows to call the clinic with any problems, questions or concerns.   The total time spent in the appointment was 40 minutes encounter with patient including review of chart and various tests results, discussions about plan of care and coordination of care plan  SUETTA HOFFMEISTER, NP 05/11/2023 10:32 AM    Labs Reviewed:  Lab Results  Component Value Date   WBC 1.0 (LL) 05/11/2023   HGB 7.5 (L) 05/11/2023   HCT 23.6 (L) 05/11/2023   MCV 95.2 05/11/2023   PLT 76 (L) 05/11/2023   Recent Labs    05/10/23 0743 05/10/23 2014 05/11/23 0417  NA 138 135 137  K 3.8 3.8 3.3*  CL 101 101 104  CO2 27 24 25   GLUCOSE 121* 115* 106*  BUN 14 16 14   CREATININE 0.78 0.79 0.64  CALCIUM 9.1 8.7* 8.4*  GFRNONAA >60 >60 >60  PROT 7.0 6.6 6.0*  ALBUMIN 3.3* 2.6* 2.3*  AST 30 34 25  ALT 39 39 33  ALKPHOS 140* 121 98  BILITOT 1.3* 1.3* 1.4*    Studies Reviewed:  CT CHEST W CONTRAST Result Date: 05/11/2023 CLINICAL DATA:  Pneumonia, complication suspected, general unwell feeling for a few days and worsening over the last few hours. History of AML. Ex-smoker. EXAM: CT CHEST WITH CONTRAST TECHNIQUE: Multidetector CT imaging of the chest was performed during intravenous contrast administration. RADIATION DOSE REDUCTION: This exam was performed according to the departmental dose-optimization program which includes automated exposure control, adjustment of the mA and/or kV according to patient size and/or use of iterative reconstruction technique. CONTRAST:  75mL OMNIPAQUE IOHEXOL 300 MG/ML  SOLN COMPARISON:  Chest radiograph 05/10/2023 and CTA chest 08/09/2022 FINDINGS: Cardiovascular: No pericardial effusion. Right chest wall Port-A-Cath tip the in the low SVC. Normal caliber aorta. Mild aortic atherosclerotic calcification. Mediastinum/Nodes: Trachea and esophagus are unremarkable. 1.1 cm precarinal node on series 2/image 25 is favored reactive. No additional enlarged lymph  nodes. Lungs/Pleura: Patchy consolidative and airspace opacities in the bilateral upper and lower lobes suspicious for multifocal pneumonia. No pleural effusion or pneumothorax. Upper Abdomen: No acute abnormality. Musculoskeletal: No acute fracture. IMPRESSION: 1. Multifocal pneumonia. Follow-up in 6-8 weeks after treatment is recommended to ensure resolution. 2. Aortic Atherosclerosis (ICD10-I70.0). Electronically Signed   By: Minerva Fester M.D.   On: 05/11/2023 02:10   DG Chest 2 View Result Date: 05/10/2023 CLINICAL DATA:  Chemo patient, fever EXAM: CHEST - 2 VIEW COMPARISON:  05/07/2023 FINDINGS: Right chest wall Port-A-Cath tip at the superior cavoatrial junction. Stable cardiomediastinal silhouette. Patchy airspace opacities in the right upper lobe suspicious for pneumonia. No pleural effusion or pneumothorax. No displaced rib fractures. IMPRESSION: Right upper lobe pneumonia. Follow-up in 6-8 weeks is recommended to ensure resolution. Electronically Signed   By: Minerva Fester M.D.   On: 05/10/2023 21:03   DG Chest Portable 1 View Result Date: 05/07/2023 CLINICAL DATA:  ALTERED MENTAL STATUS. EXAM: PORTABLE CHEST 1 VIEW COMPARISON:  05/17/2013. FINDINGS: Bilateral lung fields are clear. Bilateral costophrenic angles are clear. Normal cardio-mediastinal silhouette. No acute osseous abnormalities. The soft tissues are within normal  limits. Right-sided CT Port-A-Cath is seen with its tip overlying the cavoatrial junction region. IMPRESSION: No active disease. Electronically Signed   By: Jules Schick M.D.   On: 05/07/2023 11:01   Addendum I have seen the patient, examined her. I agree with the assessment and and plan and have edited the notes.   Patient was recently hospitalized in [facility] for pneumonia, presented with neutropenic fever, CT chest today showed a multifocal pneumonia.  Lab reviewed, continue blood transfusion if hemoglobin less than 7.5.  G-CSF is not indicated due to her acute  leukemia.  She does not need platelet transfusion at this point.  Will continue follow-up.  Malachy Mood  05/11/2023

## 2023-05-12 DIAGNOSIS — C9200 Acute myeloblastic leukemia, not having achieved remission: Secondary | ICD-10-CM | POA: Diagnosis not present

## 2023-05-12 LAB — CBC WITH DIFFERENTIAL/PLATELET
Abs Immature Granulocytes: 0 10*3/uL (ref 0.00–0.07)
Basophils Absolute: 0 10*3/uL (ref 0.0–0.1)
Basophils Relative: 0 %
Eosinophils Absolute: 0 10*3/uL (ref 0.0–0.5)
Eosinophils Relative: 0 %
HCT: 24.3 % — ABNORMAL LOW (ref 36.0–46.0)
Hemoglobin: 7.4 g/dL — ABNORMAL LOW (ref 12.0–15.0)
Immature Granulocytes: 0 %
Lymphocytes Relative: 76 %
Lymphs Abs: 0.7 10*3/uL (ref 0.7–4.0)
MCH: 29.8 pg (ref 26.0–34.0)
MCHC: 30.5 g/dL (ref 30.0–36.0)
MCV: 98 fL (ref 80.0–100.0)
Monocytes Absolute: 0.1 10*3/uL (ref 0.1–1.0)
Monocytes Relative: 10 %
Neutro Abs: 0.1 10*3/uL — CL (ref 1.7–7.7)
Neutrophils Relative %: 14 %
Platelets: 82 10*3/uL — ABNORMAL LOW (ref 150–400)
RBC: 2.48 MIL/uL — ABNORMAL LOW (ref 3.87–5.11)
RDW: 19.5 % — ABNORMAL HIGH (ref 11.5–15.5)
WBC: 1 10*3/uL — CL (ref 4.0–10.5)
nRBC: 0 % (ref 0.0–0.2)

## 2023-05-12 LAB — BASIC METABOLIC PANEL WITH GFR
Anion gap: 7 (ref 5–15)
BUN: 16 mg/dL (ref 8–23)
CO2: 24 mmol/L (ref 22–32)
Calcium: 8.7 mg/dL — ABNORMAL LOW (ref 8.9–10.3)
Chloride: 105 mmol/L (ref 98–111)
Creatinine, Ser: 0.75 mg/dL (ref 0.44–1.00)
GFR, Estimated: >60 mL/min (ref >60)
Glucose, Bld: 130 mg/dL — ABNORMAL HIGH (ref 70–99)
Potassium: 3.6 mmol/L (ref 3.5–5.1)
Sodium: 136 mmol/L (ref 135–145)

## 2023-05-12 LAB — CULTURE, BLOOD (ROUTINE X 2): Culture: NO GROWTH

## 2023-05-12 LAB — HEMOGLOBIN AND HEMATOCRIT, BLOOD
HCT: 28.5 % — ABNORMAL LOW (ref 36.0–46.0)
Hemoglobin: 9.3 g/dL — ABNORMAL LOW (ref 12.0–15.0)

## 2023-05-12 LAB — PREPARE RBC (CROSSMATCH)

## 2023-05-12 MED ORDER — SODIUM CHLORIDE 0.9% IV SOLUTION: Freq: Once | INTRAVENOUS | Status: AC

## 2023-05-12 MED ORDER — CARMEX CLASSIC LIP BALM EX OINT
TOPICAL_OINTMENT | CUTANEOUS | Status: DC | PRN
  Filled 2023-05-12: qty 10

## 2023-05-12 NOTE — Progress Notes (Signed)
 PROGRESS NOTE    [name]  ZOX:[id number] DOB: 06-17-1940 DOA: 05/10/2023 PCP: [name], MD   Brief Narrative: 58 with past medical history significant for AML followed by Dr. Leonides Schanz and Dr. Lowella Bandy from [facility], recently admitted at [facility] from 04/19/2023 through 04/29/2023, for neutropenic fever with multifocal pneumonia was treated with 10 days of antibiotics and supposed to have a follow-up with oncologist next Monday.  Patient presented with progressive weakness, developed subjective fever.  Had a temperature of 100 at home.  Evaluation in the ED patient was found to have a temperature of 103, white count of 1.  Admitted for recurrent neutropenic fever.   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  -Continue with broad spectrum antibiotics: Vancomycin and Cefepime.  -IV fluids.  -Blood Culture. No growth  -Respiratory panel. negative -Covid, Influenza, RSV negative.  -Oncology has been consulted.  Fever curve, trending down.  Continue with current care.   AML Pancytopenia -She had bone marrow biopsy on3/13/25; showing ~7% blasts in a ~20% cellular marrow concerning for recurrence of disease. Patient to follow-up with primary oncologist, Dr. Lowella Bandy on Monday. Dr Mosetta Putt will see patient in consultation. Follow recommendations.  Monitor platelet count. Hb 7., will proceed with one unit PRBC today   Cardiomyopathy associated chemotherapy,  ejection fraction July 20 24--25%.  Has improved since that to 55% hide 2D echo 04/19/2023. -On metoprolol, spironolactone and  hold  losartan.    Hypokalemia: Replaced orally.   Bradycardia; Holder parameter for metoprolol added.    Estimated body mass index is 19.22 kg/m as calculated from the following:   Height as of this encounter: 5\' 4"  (1.626 m).   Weight as of this encounter: 50.8 kg.   DVT prophylaxis: lovenox Code Status: Full code Family Communication: Disposition Plan:  Status is: Inpatient Remains inpatient appropriate because: management of neutropenic fever.     Consultants:  Oncology   Procedures:    Antimicrobials:    Subjective: Report no new complaints, no diarrhea, no worsening cough  Objective: Vitals:   05/11/23 2022 05/12/23 0428 05/12/23 0946 05/12/23 1330  BP: (!) 125/57 (!) 141/65  (!) 136/57  Pulse: 69 70 79 (!) 56  Resp: 17 16  18   Temp: 100.2 F (37.9 C) 99 F (37.2 C)  98.2 F (36.8 C)  TempSrc: Oral Oral  Oral  SpO2: 99% 98%  99%  Weight:      Height:        Intake/Output Summary (Last 24 hours) at 05/12/2023 1337 Last data filed at 05/12/2023 1004 Gross per 24 hour  Intake 590 ml  Output --  Net 590 ml   Filed Weights   05/10/23 1953  Weight: 50.8 kg    Examination:  General exam: NAD Respiratory system: normal resp effort.  Cardiovascular system: S 1, S 2 RRR Gastrointestinal system: BS present, NT, ND Central nervous system: Alert Extremities: no edema    Data Reviewed: I have personally reviewed following labs and imaging studies  CBC: Recent Labs  Lab 05/07/23 0748 05/10/23 0743 05/10/23 2014 05/11/23 0417 05/12/23 0900  WBC 0.9* 1.0* 1.0* 1.0*  1.0*  NEUTROABS 0.1* 0.1* 0.0* 0.1* 0.1*  HGB 10.0* 9.1* 8.7* 7.5* 7.4*  HCT 31.0* 28.3* 27.7* 23.6* 24.3*  MCV 91.2 92.8 94.5 95.2 98.0  PLT 117* 110* 96* 76* 82*   Basic Metabolic Panel: Recent Labs  Lab 05/07/23 0748 05/10/23 0743 05/10/23 2014 05/11/23 0417 05/12/23 0900  NA 139 138 135 137 136  K 4.1 3.8 3.8 3.3* 3.6  CL 102 101 101 104 105  CO2 27 27 24 25 24   GLUCOSE  134* 121* 115* 106* 130*  BUN 23 14 16 14 16   CREATININE 1.02* 0.78 0.79 0.64 0.75  CALCIUM 9.5 9.1 8.7* 8.4* 8.7*  MG 1.8 1.7  --   --   --    GFR: Estimated Creatinine Clearance: 45 mL/min (by C-G formula based on SCr of 0.75 mg/dL). Liver Function Tests: Recent Labs  Lab 05/07/23 0748 05/10/23 0743 05/10/23 2014 05/11/23 0417  AST 40 30 34 25  ALT 48* 39 39 33  ALKPHOS 153* 140* 121 98  BILITOT 1.0 1.3* 1.3* 1.4*  PROT 7.2 7.0 6.6 6.0*  ALBUMIN 3.4* 3.3* 2.6* 2.3*   No results for input(s): "LIPASE", "AMYLASE" in the last 168 hours. No results for input(s): "AMMONIA" in the last 168 hours. Coagulation Profile: No results for input(s): "INR", "PROTIME" in the last 168 hours. Cardiac Enzymes: No results for input(s): "CKTOTAL", "CKMB", "CKMBINDEX", "TROPONINI" in the last 168 hours. BNP (last 3 results) No results for input(s): "PROBNP" in the last 8760 hours. HbA1C: No results for input(s): "HGBA1C" in the last 72 hours. CBG: Recent Labs  Lab 05/07/23 0916  GLUCAP 112*   Lipid Profile: No results for input(s): "CHOL", "HDL", "LDLCALC", "TRIG", "CHOLHDL", "LDLDIRECT" in the last 72 hours. Thyroid Function Tests: No results for input(s): "TSH", "T4TOTAL", "FREET4", "T3FREE", "THYROIDAB" in the last 72 hours. Anemia Panel: No results for input(s): "VITAMINB12", "FOLATE", "FERRITIN", "TIBC", "IRON", "RETICCTPCT" in the last 72 hours. Sepsis Labs: Recent Labs  Lab 05/10/23 2015  LATICACIDVEN 1.0    Recent Results (from the past 240 hours)  Culture, blood (routine x 2)     Status: None   Collection Time: 05/07/23  9:26 AM   Specimen: BLOOD  Result Value Ref Range Status   Specimen Description   Final    BLOOD SITE NOT SPECIFIED Performed at [facility], [address], [place], [place] [address]    Special Requests   Final    BOTTLES DRAWN AEROBIC AND ANAEROBIC Blood Culture results may not be optimal due to an inadequate volume of blood  received in culture bottles Performed at [facility], [address], [place], [place] [address]    Culture   Final    NO GROWTH 5 DAYS Performed at [facility], [address] [place], [place] [address]    Report Status 05/12/2023 FINAL  Final  Resp panel by RT-PCR (RSV, Flu A&B, Covid) Anterior Nasal Swab     Status: None   Collection Time: 05/07/23  9:27 AM   Specimen: Anterior Nasal Swab  Result Value Ref Range Status   SARS Coronavirus 2 by RT PCR NEGATIVE NEGATIVE Final    Comment: (NOTE) SARS-CoV-2 target nucleic acids are NOT DETECTED.  The SARS-CoV-2 RNA is generally detectable in upper respiratory specimens during the acute phase of infection. The lowest concentration of SARS-CoV-2 viral copies this assay can detect is 138 copies/mL. A negative result does not preclude SARS-Cov-2 infection and should not be used as  the sole basis for treatment or other patient management decisions. A negative result may occur with  improper specimen collection/handling, submission of specimen other than nasopharyngeal swab, presence of viral mutation(s) within the areas targeted by this assay, and inadequate number of viral copies(<138 copies/mL). A negative result must be combined with clinical observations, patient history, and epidemiological information. The expected result is Negative.  Fact Sheet for Patients:  BloggerCourse.com  Fact Sheet for Healthcare Providers:  SeriousBroker.it  This test is no t yet approved or cleared by the [place] FDA and  has been authorized for detection and/or diagnosis of SARS-CoV-2 by FDA under an Emergency Use Authorization (EUA). This EUA will remain  in effect (meaning this test can be used) for the duration of the COVID-19 declaration under Section 564(b)(1) of the Act, 21 U.S.C.section 360bbb-3(b)(1), unless the authorization is terminated  or revoked sooner.        Influenza A by PCR NEGATIVE NEGATIVE Final   Influenza B by PCR NEGATIVE NEGATIVE Final    Comment: (NOTE) The Xpert Xpress SARS-CoV-2/FLU/RSV plus assay is intended as an aid in the diagnosis of influenza from Nasopharyngeal swab specimens and should not be used as a sole basis for treatment. Nasal washings and aspirates are unacceptable for Xpert Xpress SARS-CoV-2/FLU/RSV testing.  Fact Sheet for Patients: BloggerCourse.com  Fact Sheet for Healthcare Providers: SeriousBroker.it  This test is not yet approved or cleared by the [place] FDA and has been authorized for detection and/or diagnosis of SARS-CoV-2 by FDA under an Emergency Use Authorization (EUA). This EUA will remain in effect (meaning this test can be used) for the duration of the COVID-19 declaration under Section 564(b)(1) of the Act, 21 U.S.C. section 360bbb-3(b)(1), unless the authorization is terminated or revoked.     Resp Syncytial Virus by PCR NEGATIVE NEGATIVE Final    Comment: (NOTE) Fact Sheet for Patients: BloggerCourse.com  Fact Sheet for Healthcare Providers: SeriousBroker.it  This test is not yet approved or cleared by the [place] FDA and has been authorized for detection and/or diagnosis of SARS-CoV-2 by FDA under an Emergency Use Authorization (EUA). This EUA will remain in effect (meaning this test can be used) for the duration of the COVID-19 declaration under Section 564(b)(1) of the Act, 21 U.S.C. section 360bbb-3(b)(1), unless the authorization is terminated or revoked.  Performed at [facility], [address], [place], [place] [address]   Resp panel by RT-PCR (RSV, Flu A&B, Covid) Anterior Nasal Swab     Status: None   Collection Time: 05/10/23  9:04 PM   Specimen: Anterior Nasal Swab  Result Value Ref Range Status   SARS Coronavirus 2 by RT PCR  NEGATIVE NEGATIVE Final    Comment: (NOTE) SARS-CoV-2 target nucleic acids are NOT DETECTED.  The SARS-CoV-2 RNA is generally detectable in upper respiratory specimens during the acute phase of infection. The lowest concentration of SARS-CoV-2 viral copies this assay can detect is 138 copies/mL. A negative result does not preclude SARS-Cov-2 infection and should not be used as the sole basis for treatment or other patient management decisions. A negative result may occur with  improper specimen collection/handling, submission of specimen other than nasopharyngeal swab, presence of viral mutation(s) within the areas targeted by this assay, and inadequate number of viral copies(<138 copies/mL). A negative result must be combined with clinical observations, patient history, and epidemiological information. The expected result is Negative.  Fact Sheet for Patients:  BloggerCourse.com  Fact Sheet for Healthcare Providers:  SeriousBroker.it  This test is no t yet approved or cleared by the [place] and  has been authorized for detection and/or diagnosis of SARS-CoV-2 by FDA under an Emergency Use Authorization (EUA). This EUA will remain  in effect (meaning this test can be used) for the duration of the COVID-19 declaration under Section 564(b)(1) of the Act, 21 U.S.C.section 360bbb-3(b)(1), unless the authorization is terminated  or revoked sooner.       Influenza A by PCR NEGATIVE NEGATIVE Final   Influenza B by PCR NEGATIVE NEGATIVE Final    Comment: (NOTE) The Xpert Xpress SARS-CoV-2/FLU/RSV plus assay is intended as an aid in the diagnosis of influenza from Nasopharyngeal swab specimens and should not be used as a sole basis for treatment. Nasal washings and aspirates are unacceptable for Xpert Xpress SARS-CoV-2/FLU/RSV testing.  Fact Sheet for Patients: BloggerCourse.com  Fact Sheet for  Healthcare Providers: SeriousBroker.it  This test is not yet approved or cleared by the [place] FDA and has been authorized for detection and/or diagnosis of SARS-CoV-2 by FDA under an Emergency Use Authorization (EUA). This EUA will remain in effect (meaning this test can be used) for the duration of the COVID-19 declaration under Section 564(b)(1) of the Act, 21 U.S.C. section 360bbb-3(b)(1), unless the authorization is terminated or revoked.     Resp Syncytial Virus by PCR NEGATIVE NEGATIVE Final    Comment: (NOTE) Fact Sheet for Patients: BloggerCourse.com  Fact Sheet for Healthcare Providers: SeriousBroker.it  This test is not yet approved or cleared by the [place] FDA and has been authorized for detection and/or diagnosis of SARS-CoV-2 by FDA under an Emergency Use Authorization (EUA). This EUA will remain in effect (meaning this test can be used) for the duration of the COVID-19 declaration under Section 564(b)(1) of the Act, 21 U.S.C. section 360bbb-3(b)(1), unless the authorization is terminated or revoked.  Performed at [facility], [address] [place], [place] [address]   Blood culture (routine x 2)     Status: None (Preliminary result)   Collection Time: 05/10/23  9:49 PM   Specimen: BLOOD RIGHT FOREARM  Result Value Ref Range Status   Specimen Description   Final    BLOOD RIGHT FOREARM Performed at [facility], [address] [place], [place] [address]    Special Requests   Final    BOTTLES DRAWN AEROBIC AND ANAEROBIC Blood Culture adequate volume Performed at [facility], [address] [place], [place] [address]    Culture   Final    NO GROWTH 2 DAYS Performed at [facility], [address], [place], [place] [address]    Report Status PENDING  Incomplete  Blood culture (routine x 2)     Status: None  (Preliminary result)   Collection Time: 05/10/23  9:51 PM   Specimen: BLOOD LEFT HAND  Result Value Ref Range Status   Specimen Description   Final    BLOOD LEFT HAND Performed at [facility], [address] [place], [place] [address]    Special Requests   Final    BOTTLES DRAWN AEROBIC AND ANAEROBIC Blood Culture results may not be optimal due to an inadequate volume of blood received in culture bottles Performed at [facility], [address] [place], [place] [address]    Culture   Final    NO GROWTH 2 DAYS Performed at [facility], [address], [place], [place] [address]  Report Status PENDING  Incomplete  Respiratory (~20 pathogens) panel by PCR     Status: None   Collection Time: 05/11/23 11:25 AM   Specimen: Nasopharyngeal Swab; Respiratory  Result Value Ref Range Status   Adenovirus NOT DETECTED NOT DETECTED Final   Coronavirus 229E NOT DETECTED NOT DETECTED Final    Comment: (NOTE) The Coronavirus on the Respiratory Panel, DOES NOT test for the novel  Coronavirus (2019 nCoV)    Coronavirus HKU1 NOT DETECTED NOT DETECTED Final   Coronavirus NL63 NOT DETECTED NOT DETECTED Final   Coronavirus OC43 NOT DETECTED NOT DETECTED Final   Metapneumovirus NOT DETECTED NOT DETECTED Final   Rhinovirus / Enterovirus NOT DETECTED NOT DETECTED Final   Influenza A NOT DETECTED NOT DETECTED Final   Influenza B NOT DETECTED NOT DETECTED Final   Parainfluenza Virus 1 NOT DETECTED NOT DETECTED Final   Parainfluenza Virus 2 NOT DETECTED NOT DETECTED Final   Parainfluenza Virus 3 NOT DETECTED NOT DETECTED Final   Parainfluenza Virus 4 NOT DETECTED NOT DETECTED Final   Respiratory Syncytial Virus NOT DETECTED NOT DETECTED Final   Bordetella pertussis NOT DETECTED NOT DETECTED Final   Bordetella Parapertussis NOT DETECTED NOT DETECTED Final   Chlamydophila pneumoniae NOT DETECTED NOT DETECTED Final   Mycoplasma pneumoniae NOT DETECTED NOT DETECTED  Final    Comment: Performed at [facility], [address], [place], [place] [address]         Radiology Studies: CT CHEST W CONTRAST Result Date: 05/11/2023 CLINICAL DATA:  Pneumonia, complication suspected, general unwell feeling for a few days and worsening over the last few hours. History of AML. Ex-smoker. EXAM: CT CHEST WITH CONTRAST TECHNIQUE: Multidetector CT imaging of the chest was performed during intravenous contrast administration. RADIATION DOSE REDUCTION: This exam was performed according to the departmental dose-optimization program which includes automated exposure control, adjustment of the mA and/or kV according to patient size and/or use of iterative reconstruction technique. CONTRAST:  75mL OMNIPAQUE IOHEXOL 300 MG/ML  SOLN COMPARISON:  Chest radiograph 05/10/2023 and CTA chest 08/09/2022 FINDINGS: Cardiovascular: No pericardial effusion. Right chest wall Port-A-Cath tip the in the low SVC. Normal caliber aorta. Mild aortic atherosclerotic calcification. Mediastinum/Nodes: Trachea and esophagus are unremarkable. 1.1 cm precarinal node on series 2/image 25 is favored reactive. No additional enlarged lymph nodes. Lungs/Pleura: Patchy consolidative and airspace opacities in the bilateral upper and lower lobes suspicious for multifocal pneumonia. No pleural effusion or pneumothorax. Upper Abdomen: No acute abnormality. Musculoskeletal: No acute fracture. IMPRESSION: 1. Multifocal pneumonia. Follow-up in 6-8 weeks after treatment is recommended to ensure resolution. 2. Aortic Atherosclerosis (ICD10-I70.0). Electronically Signed   By: Minerva Fester M.D.   On: 05/11/2023 02:10   DG Chest 2 View Result Date: 05/10/2023 CLINICAL DATA:  Chemo patient, fever EXAM: CHEST - 2 VIEW COMPARISON:  05/07/2023 FINDINGS: Right chest wall Port-A-Cath tip at the superior cavoatrial junction. Stable cardiomediastinal silhouette. Patchy airspace opacities in the right upper lobe suspicious for  pneumonia. No pleural effusion or pneumothorax. No displaced rib fractures. IMPRESSION: Right upper lobe pneumonia. Follow-up in 6-8 weeks is recommended to ensure resolution. Electronically Signed   By: Minerva Fester M.D.   On: 05/10/2023 21:03        Scheduled Meds:  sodium chloride   Intravenous Once   acyclovir  400 mg Oral BID   Chlorhexidine Gluconate Cloth  6 each Topical Daily   vitamin B-12  1,000 mcg Oral Daily   enoxaparin (LOVENOX) injection  40 mg Subcutaneous  Q24H   feeding supplement  1 Container Oral BID BM   levothyroxine  50 mcg Oral Q0600   metoprolol succinate  25 mg Oral Daily   posaconazole  300 mg Oral Daily   spironolactone  25 mg Oral Daily   zinc sulfate (50mg  elemental zinc)  220 mg Oral Daily   Continuous Infusions:  ceFEPime (MAXIPIME) IV 2 g (05/12/23 0849)   vancomycin 750 mg (05/11/23 2351)     LOS: 2 days    Time spent: 35 minutes    Anival Pasha A Amelio Brosky, MD [facility]   If 7PM-7AM, please contact night-coverage [email or web address]  05/12/2023, 1:37 PM

## 2023-05-12 NOTE — Progress Notes (Signed)
   05/12/23 1415  TOC Brief Assessment  Insurance and Status Reviewed  Patient has primary care physician Yes  Home environment has been reviewed home  Prior level of function: independent  Prior/Current Home Services No current home services  Social Drivers of Health Review SDOH reviewed no interventions necessary  Readmission risk has been reviewed Yes  Transition of care needs no transition of care needs at this time

## 2023-05-12 NOTE — Evaluation (Signed)
 Occupational Therapy Evaluation Patient Details Name: [name] MRN: [id number] DOB: 01-10-1941 Today's Date: 05/12/2023   History of Present Illness   83 y.o. female with history of AML recently admitted at [facility] on 04/19/2023 through 04/29/2023 for neutropenic fever with multifocal pneumonia was treated with antibiotics for 10 days has follow-up with oncologist next Monday in 5 days has been progressively getting weaker and started having subjective feeling of fever chills. Dx of PNA. PMH: OA, transient global amnesia, HTN, migraines.     Clinical Impressions Patient evaluated by Occupational Therapy with no further acute OT needs identified. All education has been completed and the patient has no further questions.  See below for any follow-up Occupational Therapy or equipment needs. OT is signing off. Thank you for this referral.      If plan is discharge home, recommend the following:   Assistance with cooking/housework     Functional Status Assessment   Patient has had a recent decline in their functional status and/or demonstrates limited ability to make significant improvements in function in a reasonable and predictable amount of time     Equipment Recommendations   None recommended by OT     Recommendations for Other Services         Precautions/Restrictions         Mobility Bed Mobility Overal bed mobility: Modified Independent (HOB at 26 degrees. No use of rails)                  Transfers                          Balance Overall balance assessment: Independent                                         ADL either performed or assessed with clinical judgement   ADL Overall ADL's : At baseline                                       General ADL Comments: Pt able to demonstrate dressing, toileting, standing at sink for grooming, intact cognition and strength, no  external assistance needed except to manage IV as pt moves fast.     Vision Ability to See in Adequate Light: 0 Adequate Patient Visual Report: No change from baseline Vision Assessment?: No apparent visual deficits Additional Comments: "My vision is great"  Uses reading glasses only.     Perception Perception: Within Functional Limits       Praxis Praxis: WFL       Pertinent Vitals/Pain Pain Assessment Pain Assessment: No/denies pain     Extremity/Trunk Assessment Upper Extremity Assessment Upper Extremity Assessment: Overall WFL for tasks assessed   Lower Extremity Assessment Lower Extremity Assessment: Overall WFL for tasks assessed   Cervical / Trunk Assessment Cervical / Trunk Assessment: Normal   Communication Communication Factors Affecting Communication: Hearing impaired   Cognition Arousal: Alert Behavior During Therapy: WFL for tasks assessed/performed Cognition: No apparent impairments             OT - Cognition Comments: Pt does repeat herself at times and talks VERY fast and a lot. Needs redirection to topic but responds well to this.  Following commands: Intact       Cueing  General Comments          Exercises Other Exercises Other Exercises: Pt educated in fall prevention at home. Pt verbalized understanding and reported that her sister is currently clearing any clutter or trip hazards out of pt's home.   Shoulder Instructions      Home Living Family/patient expects to be discharged to:: Private residence Living Arrangements: Alone Available Help at Discharge: Family (Sister lives one mile a away and is pt's "caregiver".)   Home Access: Ramped entrance;Stairs to enter Entrance Stairs-Number of Steps: 2 Entrance Stairs-Rails: Right Home Layout: One level     Bathroom Shower/Tub: Chief Strategy Officer: Standard     Home Equipment: Grab bars - tub/shower;Shower seat;Grab bars - toilet;Rolling  Walker (2 wheels);Cane - single point          Prior Functioning/Environment Prior Level of Function : Needs assist;History of Falls (last six months);Driving (fell "a couple of weeks ago". No injury.)       Physical Assist : ADLs (physical)   ADLs (physical): IADLs   ADLs Comments: Last couple of weeks, pt has had her sister bring her groceries to her, but before would grocery shop "half and half" with sister.    OT Problem List: Decreased cognition   OT Treatment/Interventions:        OT Goals(Current goals can be found in the care plan section)   Acute Rehab OT Goals OT Goal Formulation: All assessment and education complete, DC therapy   OT Frequency:       Co-evaluation              AM-PAC OT "6 Clicks" Daily Activity     Outcome Measure Help from another person eating meals?: None Help from another person taking care of personal grooming?: None Help from another person toileting, which includes using toliet, bedpan, or urinal?: None Help from another person bathing (including washing, rinsing, drying)?:  (agrees to have sister present) Help from another person to put on and taking off regular upper body clothing?: None Help from another person to put on and taking off regular lower body clothing?: None 6 Click Score: 24   End of Session Equipment Utilized During Treatment: Gait belt Nurse Communication: Mobility status  Activity Tolerance: Patient tolerated treatment well Patient left: with bed alarm set;with call bell/phone within reach  OT Visit Diagnosis: History of falling (Z91.81)                Time: 4034-7425 OT Time Calculation (min): 29 min Charges:  OT General Charges $OT Visit: 1 Visit OT Evaluation $OT Eval Low Complexity: 1 Low OT Treatments $Self Care/Home Management : 8-22 mins  Victorino Dike, OT Acute Rehab Services Office: [phone] 05/12/2023   Theodoro Clock 05/12/2023, 10:04 AM

## 2023-05-12 NOTE — Evaluation (Signed)
 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 Dx of PNA. PMH: OA, transient global amnesia, HTN, migraines.  Clinical Impression  Pt is mobilizing well, she ambulated 350' with RW without loss of balance. She is mobilizing at her baseline and is ready to DC home from a PT standpoint. No further PT indicated, will sign off.         If plan is discharge home, recommend the following: Assistance with cooking/housework;Assist for transportation   Can travel by private vehicle        Equipment Recommendations None recommended by PT  Recommendations for Other Services       Functional Status Assessment Patient has not had a recent decline in their functional status     Precautions / Restrictions Precautions Precautions: Fall Recall of Precautions/Restrictions: Intact Precaution/Restrictions Comments: reports 1 fall in past 6 months, slipped out of bed while reaching for phone Restrictions Weight Bearing Restrictions Per Provider Order: No      Mobility  Bed Mobility Overal bed mobility: Modified Independent             General bed mobility comments: HOB up    Transfers Overall transfer level: Independent Equipment used: None                    Ambulation/Gait Ambulation/Gait assistance: Modified independent (Device/Increase time) Gait Distance (Feet): 350 Feet Assistive device: Rolling walker (2 wheels) Gait Pattern/deviations: WFL(Within Functional Limits) Gait velocity: WNL     General Gait Details:  steady, no loss of balance  Stairs            Wheelchair Mobility     Tilt Bed    Modified Rankin (Stroke Patients Only)       Balance Overall balance assessment: Independent                                           Pertinent Vitals/Pain Pain Assessment Pain Assessment: No/denies pain    Home Living Family/patient expects to be discharged to:: Private residence Living Arrangements: Alone Available Help at Discharge: Family (Sister lives one mile a away and is pt's "caregiver".)   Home Access: Ramped entrance;Stairs to enter Entrance Stairs-Rails: Right Entrance Stairs-Number of Steps: 2   Home Layout: One level Home Equipment: Grab bars - tub/shower;Shower seat;Grab bars - toilet;Rolling Walker (2 wheels);Cane - single point      Prior Function Prior Level of Function : Needs assist;History of Falls (last six months);Driving (fell "a couple of weeks ago". No injury.)       Physical Assist : ADLs (physical)   ADLs (physical): IADLs Mobility Comments: uses RW at home as needed, 1 fall in past 6 months (slipped out of bed while reaching for phone) ADLs Comments: Last couple of weeks, pt has had her sister bring her groceries to her, but before would grocery shop "half and half" with sister.     Extremity/Trunk Assessment   Upper Extremity Assessment Upper Extremity Assessment: Overall WFL for tasks assessed  Lower Extremity Assessment Lower Extremity Assessment: Overall WFL for tasks assessed    Cervical / Trunk Assessment Cervical / Trunk Assessment: Normal  Communication   Communication Factors Affecting Communication: Hearing impaired    Cognition Arousal: Alert Behavior During Therapy: WFL for tasks assessed/performed   PT - Cognitive impairments: No apparent impairments                         Following commands: Intact       Cueing       General Comments      Exercises     Assessment/Plan    PT  Assessment Patient does not need any further PT services  PT Problem List         PT Treatment Interventions      PT Goals (Current goals can be found in the Care Plan section)  Acute Rehab PT Goals PT Goal Formulation: All assessment and education complete, DC therapy    Frequency       Co-evaluation               AM-PAC PT "6 Clicks" Mobility  Outcome Measure Help needed turning from your back to your side while in a flat bed without using bedrails?: None Help needed moving from lying on your back to sitting on the side of a flat bed without using bedrails?: None Help needed moving to and from a bed to a chair (including a wheelchair)?: None Help needed standing up from a chair using your arms (e.g., wheelchair or bedside chair)?: None Help needed to walk in hospital room?: None Help needed climbing 3-5 steps with a railing? : None 6 Click Score: 24    End of Session Equipment Utilized During Treatment: Gait belt Activity Tolerance: Patient tolerated treatment well Patient left: in chair;with call bell/phone within reach;with nursing/sitter in room Nurse Communication: Mobility status      Time: 1340-1351 PT Time Calculation (min) (ACUTE ONLY): 11 min   Charges:   PT Evaluation $PT Eval Low Complexity: 1 Low   PT General Charges $$ ACUTE PT VISIT: 1 Visit         Tamala Ser PT 05/12/2023  Acute Rehabilitation Services  Office [phone]

## 2023-05-12 NOTE — Plan of Care (Signed)
   Problem: Education: Goal: Knowledge of General Education information will improve Description: Including pain rating scale, medication(s)/side effects and non-pharmacologic comfort measures Outcome: Progressing   Problem: Clinical Measurements: Goal: Ability to maintain clinical measurements within normal limits will improve Outcome: Progressing Goal: Will remain free from infection Outcome: Progressing

## 2023-05-12 NOTE — Plan of Care (Signed)

## 2023-05-13 DIAGNOSIS — C9200 Acute myeloblastic leukemia, not having achieved remission: Secondary | ICD-10-CM | POA: Diagnosis not present

## 2023-05-13 DIAGNOSIS — J189 Pneumonia, unspecified organism: Secondary | ICD-10-CM | POA: Diagnosis not present

## 2023-05-13 LAB — CBC WITH DIFFERENTIAL/PLATELET
Abs Immature Granulocytes: 0.07 10*3/uL (ref 0.00–0.07)
Basophils Absolute: 0 10*3/uL (ref 0.0–0.1)
Basophils Relative: 0 %
Eosinophils Absolute: 0 10*3/uL (ref 0.0–0.5)
Eosinophils Relative: 0 %
HCT: 26.3 % — ABNORMAL LOW (ref 36.0–46.0)
Hemoglobin: 8.7 g/dL — ABNORMAL LOW (ref 12.0–15.0)
Immature Granulocytes: 6 %
Lymphocytes Relative: 59 %
Lymphs Abs: 0.7 10*3/uL (ref 0.7–4.0)
MCH: 29.9 pg (ref 26.0–34.0)
MCHC: 33.1 g/dL (ref 30.0–36.0)
MCV: 90.4 fL (ref 80.0–100.0)
Monocytes Absolute: 0.2 10*3/uL (ref 0.1–1.0)
Monocytes Relative: 20 %
Neutro Abs: 0.2 10*3/uL — CL (ref 1.7–7.7)
Neutrophils Relative %: 15 %
Platelets: 99 10*3/uL — ABNORMAL LOW (ref 150–400)
RBC: 2.91 MIL/uL — ABNORMAL LOW (ref 3.87–5.11)
RDW: 20.8 % — ABNORMAL HIGH (ref 11.5–15.5)
WBC: 1.2 10*3/uL — CL (ref 4.0–10.5)
nRBC: 0 % (ref 0.0–0.2)

## 2023-05-13 LAB — BASIC METABOLIC PANEL WITH GFR
Anion gap: 11 (ref 5–15)
BUN: 19 mg/dL (ref 8–23)
CO2: 22 mmol/L (ref 22–32)
Calcium: 8.4 mg/dL — ABNORMAL LOW (ref 8.9–10.3)
Chloride: 100 mmol/L (ref 98–111)
Creatinine, Ser: 0.59 mg/dL (ref 0.44–1.00)
GFR, Estimated: >60 mL/min (ref >60)
Glucose, Bld: 115 mg/dL — ABNORMAL HIGH (ref 70–99)
Potassium: 3.2 mmol/L — ABNORMAL LOW (ref 3.5–5.1)
Sodium: 133 mmol/L — ABNORMAL LOW (ref 135–145)

## 2023-05-13 LAB — TSH: TSH: 3.949 u[IU]/mL (ref 0.350–4.500)

## 2023-05-13 MED ORDER — POTASSIUM CHLORIDE CRYS ER 20 MEQ PO TBCR
40.0000 meq | EXTENDED_RELEASE_TABLET | Freq: Once | ORAL | Status: AC
  Administered 2023-05-13: 40 meq via ORAL
  Filled 2023-05-13: qty 2

## 2023-05-13 NOTE — Progress Notes (Signed)
 [name]   DOB:March 01, 1940   ON#:[id number]   KGM#:[id number]  Heme-onc follow-up  Subjective: Patient is clinically stable, she has been afebrile.  Still has a severe neutropenia with ANC 0.2 today.  She remains to be chemotherapy antibiotics.  She is hoping to go home tomorrow.   Objective:  Vitals:   05/12/23 2248 05/13/23 0628  BP: (!) 137/58 134/62  Pulse: 71 62  Resp: 18 18  Temp: 99.5 F (37.5 C) 98.8 F (37.1 C)  SpO2: 95% 98%    Body mass index is 19.22 kg/m.  Intake/Output Summary (Last 24 hours) at 05/13/2023 1155 Last data filed at 05/13/2023 1020 Gross per 24 hour  Intake 1169.67 ml  Output --  Net 1169.67 ml     Sclerae unicteric  Oropharynx clear  MSK no focal spinal tenderness, no peripheral edema  Neuro nonfocal    CBG (last 3)  No results for input(s): "GLUCAP" in the last 72 hours.   Labs:  Lab Results  Component Value Date   WBC 1.2 (LL) 05/13/2023   HGB 8.7 (L) 05/13/2023   HCT 26.3 (L) 05/13/2023   MCV 90.4 05/13/2023   PLT 99 (L) 05/13/2023   NEUTROABS 0.2 (LL) 05/13/2023     Urine Studies No results for input(s): "UHGB", "CRYS" in the last 72 hours.  Invalid input(s): "UACOL", "UAPR", "USPG", "UPH", "UTP", "UGL", "UKET", "UBIL", "UNIT", "UROB", "ULEU", "UEPI", "UWBC", "URBC", "UBAC", "CAST", "UCOM", "BILUA"  Basic Metabolic Panel: Recent Labs  Lab 05/07/23 0748 05/10/23 0743 05/10/23 2014 05/11/23 0417 05/12/23 0900 05/13/23 1107  NA 139 138 135 137 136 133*  K 4.1 3.8 3.8 3.3* 3.6 3.2*  CL 102 101 101 104 105 100  CO2 27 27 24 25 24 22   GLUCOSE 134* 121* 115* 106* 130* 115*  BUN 23 14 16 14 16 19   CREATININE 1.02* 0.78 0.79 0.64 0.75 0.59  CALCIUM 9.5 9.1 8.7* 8.4* 8.7* 8.4*  MG 1.8 1.7  --   --   --   --    GFR Estimated Creatinine Clearance: 45 mL/min (by C-G formula based on SCr of 0.59 mg/dL). Liver Function Tests: Recent Labs  Lab 05/07/23 0748 05/10/23 0743 05/10/23 2014 05/11/23 0417  AST 40 30 34 25   ALT 48* 39 39 33  ALKPHOS 153* 140* 121 98  BILITOT 1.0 1.3* 1.3* 1.4*  PROT 7.2 7.0 6.6 6.0*  ALBUMIN 3.4* 3.3* 2.6* 2.3*   No results for input(s): "LIPASE", "AMYLASE" in the last 168 hours. No results for input(s): "AMMONIA" in the last 168 hours. Coagulation profile No results for input(s): "INR", "PROTIME" in the last 168 hours.  CBC: Recent Labs  Lab 05/10/23 0743 05/10/23 2014 05/11/23 0417 05/12/23 0900 05/12/23 2016 05/13/23 1107  WBC 1.0* 1.0* 1.0* 1.0*  --  1.2*  NEUTROABS 0.1* 0.0* 0.1* 0.1*  --  0.2*  HGB 9.1* 8.7* 7.5* 7.4* 9.3* 8.7*  HCT 28.3* 27.7* 23.6* 24.3* 28.5* 26.3*  MCV 92.8 94.5 95.2 98.0  --  90.4  PLT 110* 96* 76* 82*  --  99*   Cardiac Enzymes: No results for input(s): "CKTOTAL", "CKMB", "CKMBINDEX", "TROPONINI" in the last 168 hours. BNP: Invalid input(s): "POCBNP" CBG: Recent Labs  Lab 05/07/23 0916  GLUCAP 112*   D-Dimer No results for input(s): "DDIMER" in the last 72 hours. Hgb A1c No results for input(s): "HGBA1C" in the last 72 hours. Lipid Profile No results for input(s): "CHOL", "HDL", "LDLCALC", "TRIG", "CHOLHDL", "LDLDIRECT" in the last 72 hours.  Thyroid function studies No results for input(s): "TSH", "T4TOTAL", "T3FREE", "THYROIDAB" in the last 72 hours.  Invalid input(s): "FREET3" Anemia work up No results for input(s): "VITAMINB12", "FOLATE", "FERRITIN", "TIBC", "IRON", "RETICCTPCT" in the last 72 hours. Microbiology Recent Results (from the past 240 hours)  Culture, blood (routine x 2)     Status: None   Collection Time: 05/07/23  9:26 AM   Specimen: BLOOD  Result Value Ref Range Status   Specimen Description   Final    BLOOD SITE NOT SPECIFIED Performed at [facility], [address] [place], [place] [address]    Special Requests   Final    BOTTLES DRAWN AEROBIC AND ANAEROBIC Blood Culture results may not be optimal due to an inadequate volume of blood received in culture bottles Performed  at [facility], [address] [place], [place] [address]    Culture   Final    NO GROWTH 5 DAYS Performed at [facility], [address] [place], [place] [address]    Report Status 05/12/2023 FINAL  Final  Resp panel by RT-PCR (RSV, Flu A&B, Covid) Anterior Nasal Swab     Status: None   Collection Time: 05/07/23  9:27 AM   Specimen: Anterior Nasal Swab  Result Value Ref Range Status   SARS Coronavirus 2 by RT PCR NEGATIVE NEGATIVE Final    Comment: (NOTE) SARS-CoV-2 target nucleic acids are NOT DETECTED.  The SARS-CoV-2 RNA is generally detectable in upper respiratory specimens during the acute phase of infection. The lowest concentration of SARS-CoV-2 viral copies this assay can detect is 138 copies/mL. A negative result does not preclude SARS-Cov-2 infection and should not be used as the sole basis for treatment or other patient management decisions. A negative result may occur with  improper specimen collection/handling, submission of specimen other than nasopharyngeal swab, presence of viral mutation(s) within the areas targeted by this assay, and inadequate number of viral copies(<138 copies/mL). A negative result must be combined with clinical observations, patient history, and epidemiological information. The expected result is Negative.  Fact Sheet for Patients:  BloggerCourse.com  Fact Sheet for Healthcare Providers:  SeriousBroker.it  This test is no t yet approved or cleared by the [place] FDA and  has been authorized for detection and/or diagnosis of SARS-CoV-2 by FDA under an Emergency Use Authorization (EUA). This EUA will remain  in effect (meaning this test can be used) for the duration of the COVID-19 declaration under Section 564(b)(1) of the Act, 21 U.S.C.section 360bbb-3(b)(1), unless the authorization is terminated  or revoked sooner.       Influenza A by PCR NEGATIVE  NEGATIVE Final   Influenza B by PCR NEGATIVE NEGATIVE Final    Comment: (NOTE) The Xpert Xpress SARS-CoV-2/FLU/RSV plus assay is intended as an aid in the diagnosis of influenza from Nasopharyngeal swab specimens and should not be used as a sole basis for treatment. Nasal washings and aspirates are unacceptable for Xpert Xpress SARS-CoV-2/FLU/RSV testing.  Fact Sheet for Patients: BloggerCourse.com  Fact Sheet for Healthcare Providers: SeriousBroker.it  This test is not yet approved or cleared by the [place] FDA and has been authorized for detection and/or diagnosis of SARS-CoV-2 by FDA under an Emergency Use Authorization (EUA). This EUA will remain in effect (meaning this test can be used) for the duration of the COVID-19 declaration under Section 564(b)(1) of the Act, 21 U.S.C. section 360bbb-3(b)(1), unless the authorization is terminated or revoked.     Resp  Syncytial Virus by PCR NEGATIVE NEGATIVE Final    Comment: (NOTE) Fact Sheet for Patients: BloggerCourse.com  Fact Sheet for Healthcare Providers: SeriousBroker.it  This test is not yet approved or cleared by the [place] FDA and has been authorized for detection and/or diagnosis of SARS-CoV-2 by FDA under an Emergency Use Authorization (EUA). This EUA will remain in effect (meaning this test can be used) for the duration of the COVID-19 declaration under Section 564(b)(1) of the Act, 21 U.S.C. section 360bbb-3(b)(1), unless the authorization is terminated or revoked.  Performed at [facility], [address], [place], [place] [address]   Resp panel by RT-PCR (RSV, Flu A&B, Covid) Anterior Nasal Swab     Status: None   Collection Time: 05/10/23  9:04 PM   Specimen: Anterior Nasal Swab  Result Value Ref Range Status   SARS Coronavirus 2 by RT PCR NEGATIVE NEGATIVE Final    Comment:  (NOTE) SARS-CoV-2 target nucleic acids are NOT DETECTED.  The SARS-CoV-2 RNA is generally detectable in upper respiratory specimens during the acute phase of infection. The lowest concentration of SARS-CoV-2 viral copies this assay can detect is 138 copies/mL. A negative result does not preclude SARS-Cov-2 infection and should not be used as the sole basis for treatment or other patient management decisions. A negative result may occur with  improper specimen collection/handling, submission of specimen other than nasopharyngeal swab, presence of viral mutation(s) within the areas targeted by this assay, and inadequate number of viral copies(<138 copies/mL). A negative result must be combined with clinical observations, patient history, and epidemiological information. The expected result is Negative.  Fact Sheet for Patients:  BloggerCourse.com  Fact Sheet for Healthcare Providers:  SeriousBroker.it  This test is no t yet approved or cleared by the [place] FDA and  has been authorized for detection and/or diagnosis of SARS-CoV-2 by FDA under an Emergency Use Authorization (EUA). This EUA will remain  in effect (meaning this test can be used) for the duration of the COVID-19 declaration under Section 564(b)(1) of the Act, 21 U.S.C.section 360bbb-3(b)(1), unless the authorization is terminated  or revoked sooner.       Influenza A by PCR NEGATIVE NEGATIVE Final   Influenza B by PCR NEGATIVE NEGATIVE Final    Comment: (NOTE) The Xpert Xpress SARS-CoV-2/FLU/RSV plus assay is intended as an aid in the diagnosis of influenza from Nasopharyngeal swab specimens and should not be used as a sole basis for treatment. Nasal washings and aspirates are unacceptable for Xpert Xpress SARS-CoV-2/FLU/RSV testing.  Fact Sheet for Patients: BloggerCourse.com  Fact Sheet for Healthcare  Providers: SeriousBroker.it  This test is not yet approved or cleared by the [place] FDA and has been authorized for detection and/or diagnosis of SARS-CoV-2 by FDA under an Emergency Use Authorization (EUA). This EUA will remain in effect (meaning this test can be used) for the duration of the COVID-19 declaration under Section 564(b)(1) of the Act, 21 U.S.C. section 360bbb-3(b)(1), unless the authorization is terminated or revoked.     Resp Syncytial Virus by PCR NEGATIVE NEGATIVE Final    Comment: (NOTE) Fact Sheet for Patients: BloggerCourse.com  Fact Sheet for Healthcare Providers: SeriousBroker.it  This test is not yet approved or cleared by the [place] FDA and has been authorized for detection and/or diagnosis of SARS-CoV-2 by FDA under an Emergency Use Authorization (EUA). This EUA will remain in effect (meaning this test can be used) for the duration of the COVID-19 declaration under Section 564(b)(1) of  the Act, 21 U.S.C. section 360bbb-3(b)(1), unless the authorization is terminated or revoked.  Performed at [facility], [address] [place], [place] [address]   Blood culture (routine x 2)     Status: None (Preliminary result)   Collection Time: 05/10/23  9:49 PM   Specimen: BLOOD RIGHT FOREARM  Result Value Ref Range Status   Specimen Description   Final    BLOOD RIGHT FOREARM Performed at [facility], [address], [place], [place] [address]    Special Requests   Final    BOTTLES DRAWN AEROBIC AND ANAEROBIC Blood Culture adequate volume Performed at [facility], [address], [place], [place] [address]    Culture   Final    NO GROWTH 3 DAYS Performed at [facility], [address] [place], [place] [address]    Report Status PENDING  Incomplete  Blood culture (routine x 2)     Status: None (Preliminary result)    Collection Time: 05/10/23  9:51 PM   Specimen: BLOOD LEFT HAND  Result Value Ref Range Status   Specimen Description   Final    BLOOD LEFT HAND Performed at [facility], [address] [place], [place] [address]    Special Requests   Final    BOTTLES DRAWN AEROBIC AND ANAEROBIC Blood Culture results may not be optimal due to an inadequate volume of blood received in culture bottles Performed at [facility], [address] [place], [place] [address]    Culture   Final    NO GROWTH 3 DAYS Performed at [facility], [address] [place], [place] [address]    Report Status PENDING  Incomplete  Respiratory (~20 pathogens) panel by PCR     Status: None   Collection Time: 05/11/23 11:25 AM   Specimen: Nasopharyngeal Swab; Respiratory  Result Value Ref Range Status   Adenovirus NOT DETECTED NOT DETECTED Final   Coronavirus 229E NOT DETECTED NOT DETECTED Final    Comment: (NOTE) The Coronavirus on the Respiratory Panel, DOES NOT test for the novel  Coronavirus (2019 nCoV)    Coronavirus HKU1 NOT DETECTED NOT DETECTED Final   Coronavirus NL63 NOT DETECTED NOT DETECTED Final   Coronavirus OC43 NOT DETECTED NOT DETECTED Final   Metapneumovirus NOT DETECTED NOT DETECTED Final   Rhinovirus / Enterovirus NOT DETECTED NOT DETECTED Final   Influenza A NOT DETECTED NOT DETECTED Final   Influenza B NOT DETECTED NOT DETECTED Final   Parainfluenza Virus 1 NOT DETECTED NOT DETECTED Final   Parainfluenza Virus 2 NOT DETECTED NOT DETECTED Final   Parainfluenza Virus 3 NOT DETECTED NOT DETECTED Final   Parainfluenza Virus 4 NOT DETECTED NOT DETECTED Final   Respiratory Syncytial Virus NOT DETECTED NOT DETECTED Final   Bordetella pertussis NOT DETECTED NOT DETECTED Final   Bordetella Parapertussis NOT DETECTED NOT DETECTED Final   Chlamydophila pneumoniae NOT DETECTED NOT DETECTED Final   Mycoplasma pneumoniae NOT DETECTED NOT DETECTED Final    Comment:  Performed at [facility], [address], [place], [place] [address]      Studies:  No results found.  Assessment: 83 y.o. female   Neutropenic fever and multifocal pneumonia AML with pancytopenia Generalized weakness    Plan:  -Lab reviewed, her pancytopenia is overall stable, remains to be severely neutropenic with ANC 0.2.  This is related to her AML.  G-CSF is not recommended. -This is her second  hospital admission for multifocal pneumonia, I recommend her to complete the course of intravenous antibiotics, but will defer the final decision to the primary care team and Dr. Leonides Schanz.  He will follow-up with patient tomorrow. -No need for blood transfusion at this point.   Malachy Mood, MD 05/13/2023  11:55 AM

## 2023-05-13 NOTE — Progress Notes (Signed)
 Mobility Specialist - Progress Note   05/13/23 0820  Mobility  Activity Ambulated with assistance in hallway  Level of Assistance Modified independent, requires aide device or extra time  Assistive Device Front wheel walker  Distance Ambulated (ft) 350 ft  Activity Response Tolerated well  Mobility Referral Yes  Mobility visit 1 Mobility  Mobility Specialist Start Time (ACUTE ONLY) 0801  Mobility Specialist Stop Time (ACUTE ONLY) 0818  Mobility Specialist Time Calculation (min) (ACUTE ONLY) 17 min   Pt received in bed and agreeable to mobility. No complaints during session. Pt to recliner after session with all needs met. NT in room.   [facility]

## 2023-05-13 NOTE — Progress Notes (Signed)
 PROGRESS NOTE    [name]  ZOX:[id number] DOB: 03-22-1940 DOA: 05/10/2023 PCP: [name], MD   Brief Narrative: 47 with past medical history significant for AML followed by Dr. Leonides Schanz and Dr. Lowella Bandy from [facility], recently admitted at [facility] from 04/19/2023 through 04/29/2023, for neutropenic fever with multifocal pneumonia was treated with 10 days of antibiotics and supposed to have a follow-up with oncologist next Monday.  Patient presented with progressive weakness, developed subjective fever.  Had a temperature of 100 at home.  Evaluation in the ED patient was found to have a temperature of 103, white count of 1.  Admitted for recurrent neutropenic fever.   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  -Continue with broad spectrum antibiotics: Vancomycin and Cefepime.  -IV fluids.  -Blood Culture. No growth  -Respiratory panel. negative -Covid, Influenza, RSV negative.  -Oncology has been consulted.  Continue with current care.  Afebrile.   AML Pancytopenia -She had bone marrow biopsy on3/13/25; showing ~7% blasts in a ~20% cellular marrow concerning for recurrence of disease. Patient to follow-up with primary oncologist, Dr. Lowella Bandy on Monday. Dr Mosetta Putt will see patient in consultation. Follow recommendations.  Monitor platelet count. Received one unit PRBC 4/05. Hb increased to 8.7  Cardiomyopathy associated chemotherapy,  ejection fraction July 20 24--25%.  Has improved since that to 55% hide 2D echo 04/19/2023. -On metoprolol, spironolactone and  hold losartan.     Hypokalemia: Replaced orally.   Bradycardia; Holder parameter for metoprolol added.   Mild hyponatremia; monitor.   Estimated body mass index is 19.22 kg/m as calculated from the following:   Height as of this encounter: 5\' 4"  (1.626 m).   Weight as of this encounter: 50.8 kg.   DVT prophylaxis: lovenox Code Status: Full code Family Communication: Disposition Plan:  Status is: Inpatient Remains inpatient appropriate because: management of neutropenic fever.     Consultants:  Oncology   Procedures:    Antimicrobials:    Subjective: No new complaints. Feels better.   Objective: Vitals:   05/12/23 1651 05/12/23 2248 05/13/23 0628 05/13/23 1315  BP: (!) 146/60 (!) 137/58 134/62 131/60  Pulse: (!) 59 71 62 60  Resp: 18 18 18 18   Temp: 98.5 F (36.9 C) 99.5 F (37.5 C) 98.8 F (37.1 C) 99.4 F (37.4 C)  TempSrc: Oral Oral Oral Oral  SpO2: 100% 95% 98% 100%  Weight:      Height:        Intake/Output Summary (Last 24 hours) at 05/13/2023 1414 Last data filed at 05/13/2023 1020 Gross per 24 hour  Intake 1169.67 ml  Output --  Net 1169.67 ml   Filed Weights   05/10/23 1953  Weight: 50.8 kg    Examination:  General exam: NAD Respiratory system: CTA Cardiovascular system: S 1, S 2 RRR Gastrointestinal system: BS present, soft, nt Central nervous system: alert Extremities: no edema    Data Reviewed: I have personally reviewed following labs and imaging studies  CBC: Recent Labs  Lab 05/10/23 0743 05/10/23 2014 05/11/23 0417 05/12/23 0900 05/12/23 2016 05/13/23 1107  WBC 1.0* 1.0* 1.0* 1.0*  --  1.2*  NEUTROABS 0.1* 0.0* 0.1* 0.1*  --  0.2*  HGB 9.1* 8.7* 7.5* 7.4* 9.3* 8.7*  HCT 28.3* 27.7* 23.6* 24.3* 28.5* 26.3*  MCV 92.8 94.5 95.2 98.0  --  90.4  PLT 110* 96* 76* 82*  --  99*   Basic Metabolic Panel: Recent Labs  Lab 05/07/23 0748 05/10/23 0743 05/10/23 2014 05/11/23 0417 05/12/23 0900 05/13/23 1107  NA 139 138 135 137 136  133*  K 4.1 3.8 3.8 3.3* 3.6 3.2*  CL 102 101 101 104 105 100  CO2 27 27 24 25 24 22   GLUCOSE 134* 121* 115* 106* 130* 115*  BUN 23 14 16 14 16 19   CREATININE 1.02* 0.78 0.79 0.64 0.75 0.59  CALCIUM 9.5 9.1 8.7* 8.4* 8.7* 8.4*  MG 1.8 1.7  --   --   --   --    GFR: Estimated Creatinine Clearance: 45 mL/min (by C-G formula based on SCr of 0.59 mg/dL). Liver Function Tests: Recent Labs  Lab 05/07/23 0748 05/10/23 0743 05/10/23 2014 05/11/23 0417  AST 40 30 34 25  ALT 48* 39 39 33  ALKPHOS 153* 140* 121 98  BILITOT 1.0 1.3* 1.3* 1.4*  PROT 7.2 7.0 6.6 6.0*  ALBUMIN 3.4* 3.3* 2.6* 2.3*   No results for input(s): "LIPASE", "AMYLASE" in the last 168 hours. No results for input(s): "AMMONIA" in the last 168 hours. Coagulation Profile: No results for input(s): "INR", "PROTIME" in the last 168 hours. Cardiac Enzymes: No results for input(s): "CKTOTAL", "CKMB", "CKMBINDEX", "TROPONINI" in the last 168 hours. BNP (last 3 results) No results for input(s): "PROBNP" in the last 8760 hours. HbA1C: No results for input(s): "HGBA1C" in the last 72 hours. CBG: Recent Labs  Lab 05/07/23 0916  GLUCAP 112*   Lipid Profile: No results for input(s): "CHOL", "HDL", "LDLCALC", "TRIG", "CHOLHDL", "LDLDIRECT" in the last 72 hours. Thyroid Function Tests: Recent Labs    05/13/23 1107  TSH 3.949   Anemia Panel: No results for input(s): "VITAMINB12", "FOLATE", "FERRITIN", "TIBC", "IRON", "RETICCTPCT" in the last 72 hours. Sepsis Labs: Recent Labs  Lab 05/10/23 2015  LATICACIDVEN 1.0    Recent Results (from the past 240 hours)  Culture, blood (routine x 2)     Status: None   Collection Time: 05/07/23  9:26 AM   Specimen: BLOOD  Result Value Ref Range Status   Specimen Description   Final    BLOOD SITE NOT SPECIFIED Performed at [facility], [address] [place], [place] [address]    Special Requests   Final    BOTTLES DRAWN AEROBIC AND ANAEROBIC Blood  Culture results may not be optimal due to an inadequate volume of blood received in culture bottles Performed at [facility], [address], [place], [place] [address]    Culture   Final    NO GROWTH 5 DAYS Performed at [facility], [address] [place], [place] [address]    Report Status 05/12/2023 FINAL  Final  Resp panel by RT-PCR (RSV, Flu A&B, Covid) Anterior Nasal Swab     Status: None   Collection Time: 05/07/23  9:27 AM   Specimen: Anterior Nasal Swab  Result Value Ref Range Status   SARS Coronavirus 2 by RT PCR NEGATIVE NEGATIVE Final    Comment: (NOTE) SARS-CoV-2 target nucleic acids are NOT DETECTED.  The SARS-CoV-2 RNA is generally detectable in upper respiratory specimens during the acute phase of infection. The lowest concentration of SARS-CoV-2 viral  copies this assay can detect is 138 copies/mL. A negative result does not preclude SARS-Cov-2 infection and should not be used as the sole basis for treatment or other patient management decisions. A negative result may occur with  improper specimen collection/handling, submission of specimen other than nasopharyngeal swab, presence of viral mutation(s) within the areas targeted by this assay, and inadequate number of viral copies(<138 copies/mL). A negative result must be combined with clinical observations, patient history, and epidemiological information. The expected result is Negative.  Fact Sheet for Patients:  BloggerCourse.com  Fact Sheet for Healthcare Providers:  SeriousBroker.it  This test is no t yet approved or cleared by the [place] FDA and  has been authorized for detection and/or diagnosis of SARS-CoV-2 by FDA under an Emergency Use Authorization (EUA). This EUA will remain  in effect (meaning this test can be used) for the duration of the COVID-19 declaration under Section 564(b)(1) of the Act, 21 U.S.C.section  360bbb-3(b)(1), unless the authorization is terminated  or revoked sooner.       Influenza A by PCR NEGATIVE NEGATIVE Final   Influenza B by PCR NEGATIVE NEGATIVE Final    Comment: (NOTE) The Xpert Xpress SARS-CoV-2/FLU/RSV plus assay is intended as an aid in the diagnosis of influenza from Nasopharyngeal swab specimens and should not be used as a sole basis for treatment. Nasal washings and aspirates are unacceptable for Xpert Xpress SARS-CoV-2/FLU/RSV testing.  Fact Sheet for Patients: BloggerCourse.com  Fact Sheet for Healthcare Providers: SeriousBroker.it  This test is not yet approved or cleared by the [place] FDA and has been authorized for detection and/or diagnosis of SARS-CoV-2 by FDA under an Emergency Use Authorization (EUA). This EUA will remain in effect (meaning this test can be used) for the duration of the COVID-19 declaration under Section 564(b)(1) of the Act, 21 U.S.C. section 360bbb-3(b)(1), unless the authorization is terminated or revoked.     Resp Syncytial Virus by PCR NEGATIVE NEGATIVE Final    Comment: (NOTE) Fact Sheet for Patients: BloggerCourse.com  Fact Sheet for Healthcare Providers: SeriousBroker.it  This test is not yet approved or cleared by the [place] FDA and has been authorized for detection and/or diagnosis of SARS-CoV-2 by FDA under an Emergency Use Authorization (EUA). This EUA will remain in effect (meaning this test can be used) for the duration of the COVID-19 declaration under Section 564(b)(1) of the Act, 21 U.S.C. section 360bbb-3(b)(1), unless the authorization is terminated or revoked.  Performed at [facility], [address] [place], [place] [address]   Resp panel by RT-PCR (RSV, Flu A&B, Covid) Anterior Nasal Swab     Status: None   Collection Time: 05/10/23  9:04 PM   Specimen: Anterior  Nasal Swab  Result Value Ref Range Status   SARS Coronavirus 2 by RT PCR NEGATIVE NEGATIVE Final    Comment: (NOTE) SARS-CoV-2 target nucleic acids are NOT DETECTED.  The SARS-CoV-2 RNA is generally detectable in upper respiratory specimens during the acute phase of infection. The lowest concentration of SARS-CoV-2 viral copies this assay can detect is 138 copies/mL. A negative result does not preclude SARS-Cov-2 infection and should not be used as the sole basis for treatment or other patient management decisions. A negative result may occur with  improper specimen collection/handling, submission of specimen other than nasopharyngeal swab, presence of viral mutation(s) within the areas targeted by this assay, and inadequate number of viral copies(<138 copies/mL). A negative result must be combined with clinical observations, patient history, and  epidemiological information. The expected result is Negative.  Fact Sheet for Patients:  BloggerCourse.com  Fact Sheet for Healthcare Providers:  SeriousBroker.it  This test is no t yet approved or cleared by the [place] FDA and  has been authorized for detection and/or diagnosis of SARS-CoV-2 by FDA under an Emergency Use Authorization (EUA). This EUA will remain  in effect (meaning this test can be used) for the duration of the COVID-19 declaration under Section 564(b)(1) of the Act, 21 U.S.C.section 360bbb-3(b)(1), unless the authorization is terminated  or revoked sooner.       Influenza A by PCR NEGATIVE NEGATIVE Final   Influenza B by PCR NEGATIVE NEGATIVE Final    Comment: (NOTE) The Xpert Xpress SARS-CoV-2/FLU/RSV plus assay is intended as an aid in the diagnosis of influenza from Nasopharyngeal swab specimens and should not be used as a sole basis for treatment. Nasal washings and aspirates are unacceptable for Xpert Xpress SARS-CoV-2/FLU/RSV testing.  Fact Sheet for  Patients: BloggerCourse.com  Fact Sheet for Healthcare Providers: SeriousBroker.it  This test is not yet approved or cleared by the [place] FDA and has been authorized for detection and/or diagnosis of SARS-CoV-2 by FDA under an Emergency Use Authorization (EUA). This EUA will remain in effect (meaning this test can be used) for the duration of the COVID-19 declaration under Section 564(b)(1) of the Act, 21 U.S.C. section 360bbb-3(b)(1), unless the authorization is terminated or revoked.     Resp Syncytial Virus by PCR NEGATIVE NEGATIVE Final    Comment: (NOTE) Fact Sheet for Patients: BloggerCourse.com  Fact Sheet for Healthcare Providers: SeriousBroker.it  This test is not yet approved or cleared by the [place] FDA and has been authorized for detection and/or diagnosis of SARS-CoV-2 by FDA under an Emergency Use Authorization (EUA). This EUA will remain in effect (meaning this test can be used) for the duration of the COVID-19 declaration under Section 564(b)(1) of the Act, 21 U.S.C. section 360bbb-3(b)(1), unless the authorization is terminated or revoked.  Performed at [facility], [address] [place], [place] [address]   Blood culture (routine x 2)     Status: None (Preliminary result)   Collection Time: 05/10/23  9:49 PM   Specimen: BLOOD RIGHT FOREARM  Result Value Ref Range Status   Specimen Description   Final    BLOOD RIGHT FOREARM Performed at [facility], [address] [place], [place] [address]    Special Requests   Final    BOTTLES DRAWN AEROBIC AND ANAEROBIC Blood Culture adequate volume Performed at [facility], [address] [place], [place] [address]    Culture   Final    NO GROWTH 3 DAYS Performed at [facility], [address], [place], [place] [address]    Report Status PENDING   Incomplete  Blood culture (routine x 2)     Status: None (Preliminary result)   Collection Time: 05/10/23  9:51 PM   Specimen: BLOOD LEFT HAND  Result Value Ref Range Status   Specimen Description   Final    BLOOD LEFT HAND Performed at [facility], [address] [place], [place] [address]    Special Requests   Final    BOTTLES DRAWN AEROBIC AND ANAEROBIC Blood Culture results may not be optimal due to an inadequate volume of blood received in culture bottles Performed at [facility], [address] [place], [place] [address]    Culture   Final  NO GROWTH 3 DAYS Performed at [facility], [address] [place], [place] [address]    Report Status PENDING  Incomplete  Respiratory (~20 pathogens) panel by PCR     Status: None   Collection Time: 05/11/23 11:25 AM   Specimen: Nasopharyngeal Swab; Respiratory  Result Value Ref Range Status   Adenovirus NOT DETECTED NOT DETECTED Final   Coronavirus 229E NOT DETECTED NOT DETECTED Final    Comment: (NOTE) The Coronavirus on the Respiratory Panel, DOES NOT test for the novel  Coronavirus (2019 nCoV)    Coronavirus HKU1 NOT DETECTED NOT DETECTED Final   Coronavirus NL63 NOT DETECTED NOT DETECTED Final   Coronavirus OC43 NOT DETECTED NOT DETECTED Final   Metapneumovirus NOT DETECTED NOT DETECTED Final   Rhinovirus / Enterovirus NOT DETECTED NOT DETECTED Final   Influenza A NOT DETECTED NOT DETECTED Final   Influenza B NOT DETECTED NOT DETECTED Final   Parainfluenza Virus 1 NOT DETECTED NOT DETECTED Final   Parainfluenza Virus 2 NOT DETECTED NOT DETECTED Final   Parainfluenza Virus 3 NOT DETECTED NOT DETECTED Final   Parainfluenza Virus 4 NOT DETECTED NOT DETECTED Final   Respiratory Syncytial Virus NOT DETECTED NOT DETECTED Final   Bordetella pertussis NOT DETECTED NOT DETECTED Final   Bordetella Parapertussis NOT DETECTED NOT DETECTED Final   Chlamydophila pneumoniae NOT DETECTED NOT DETECTED  Final   Mycoplasma pneumoniae NOT DETECTED NOT DETECTED Final    Comment: Performed at [facility], [address], [place], [place] [address]         Radiology Studies: No results found.       Scheduled Meds:  acyclovir  400 mg Oral BID   Chlorhexidine Gluconate Cloth  6 each Topical Daily   vitamin B-12  1,000 mcg Oral Daily   enoxaparin (LOVENOX) injection  40 mg Subcutaneous Q24H   feeding supplement  1 Container Oral BID BM   levothyroxine  50 mcg Oral Q0600   metoprolol succinate  25 mg Oral Daily   posaconazole  300 mg Oral Daily   potassium chloride  40 mEq Oral Once   spironolactone  25 mg Oral Daily   zinc sulfate (50mg  elemental zinc)  220 mg Oral Daily   Continuous Infusions:  ceFEPime (MAXIPIME) IV 2 g (05/13/23 0937)   vancomycin Stopped (05/13/23 0140)     LOS: 3 days    Time spent: 35 minutes    Raihan Kimmel A Thalya Fouche, MD [facility]   If 7PM-7AM, please contact night-coverage [email or web address]  05/13/2023, 2:14 PM

## 2023-05-13 NOTE — Plan of Care (Signed)

## 2023-05-14 ENCOUNTER — Inpatient Hospital Stay

## 2023-05-14 DIAGNOSIS — C9200 Acute myeloblastic leukemia, not having achieved remission: Secondary | ICD-10-CM | POA: Diagnosis not present

## 2023-05-14 LAB — CBC WITH DIFFERENTIAL/PLATELET
Abs Immature Granulocytes: 0.1 10*3/uL — ABNORMAL HIGH (ref 0.00–0.07)
Basophils Absolute: 0 10*3/uL (ref 0.0–0.1)
Basophils Relative: 0 %
Eosinophils Absolute: 0 10*3/uL (ref 0.0–0.5)
Eosinophils Relative: 0 %
HCT: 27 % — ABNORMAL LOW (ref 36.0–46.0)
Hemoglobin: 9 g/dL — ABNORMAL LOW (ref 12.0–15.0)
Immature Granulocytes: 8 %
Lymphocytes Relative: 62 %
Lymphs Abs: 0.8 10*3/uL (ref 0.7–4.0)
MCH: 30.3 pg (ref 26.0–34.0)
MCHC: 33.3 g/dL (ref 30.0–36.0)
MCV: 90.9 fL (ref 80.0–100.0)
Monocytes Absolute: 0.2 10*3/uL (ref 0.1–1.0)
Monocytes Relative: 15 %
Neutro Abs: 0.2 10*3/uL — CL (ref 1.7–7.7)
Neutrophils Relative %: 15 %
Platelets: 116 10*3/uL — ABNORMAL LOW (ref 150–400)
RBC: 2.97 MIL/uL — ABNORMAL LOW (ref 3.87–5.11)
RDW: 20.2 % — ABNORMAL HIGH (ref 11.5–15.5)
WBC: 1.3 10*3/uL — CL (ref 4.0–10.5)
nRBC: 0 % (ref 0.0–0.2)

## 2023-05-14 LAB — BASIC METABOLIC PANEL WITH GFR
Anion gap: 10 (ref 5–15)
BUN: 19 mg/dL (ref 8–23)
CO2: 22 mmol/L (ref 22–32)
Calcium: 8.6 mg/dL — ABNORMAL LOW (ref 8.9–10.3)
Chloride: 103 mmol/L (ref 98–111)
Creatinine, Ser: 0.64 mg/dL (ref 0.44–1.00)
GFR, Estimated: >60 mL/min (ref >60)
Glucose, Bld: 106 mg/dL — ABNORMAL HIGH (ref 70–99)
Potassium: 3.9 mmol/L (ref 3.5–5.1)
Sodium: 135 mmol/L (ref 135–145)

## 2023-05-14 LAB — TYPE AND SCREEN
ABO/RH(D): A POS
Antibody Screen: NEGATIVE
Unit division: 0

## 2023-05-14 LAB — BPAM RBC
Blood Product Expiration Date: 202504152359
ISSUE DATE / TIME: 202504051407
Unit Type and Rh: 6200

## 2023-05-14 MED ORDER — GUAIFENESIN ER 600 MG PO TB12
1200.0000 mg | ORAL_TABLET | Freq: Two times a day (BID) | ORAL | Status: DC
  Administered 2023-05-14 – 2023-05-16 (×5): 1200 mg via ORAL
  Filled 2023-05-14 (×5): qty 2

## 2023-05-14 NOTE — Progress Notes (Addendum)
 PROGRESS NOTE    [name]  ZOX:[id number] DOB: 1940/11/18 DOA: 05/10/2023 PCP: [name], MD   Brief Narrative: 51 with past medical history significant for AML followed by Dr. Leonides Schanz and Dr. Lowella Bandy from [facility], recently admitted at [facility] from 04/19/2023 through 04/29/2023, for neutropenic fever with multifocal pneumonia was treated with 10 days of antibiotics and supposed to have a follow-up with oncologist next Monday.  Patient presented with progressive weakness, developed subjective fever.  Had a temperature of 100 at home.  Evaluation in the ED patient was found to have a temperature of 103, white count of 1.  Admitted for recurrent neutropenic fever.   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  -Continue with broad spectrum antibiotics: Vancomycin and Cefepime.  -Blood Culture. No growth  -Respiratory panel. negative -Covid, Influenza, RSV negative.  -Oncology has been consulted.  Continue with current care.  Afebrile.  WBC 1.3 today. Awaiting Dr Leonides Schanz evaluation.  Had T 100.2 this afternoon. Will monitor. If spike higher might have to change cefepime to Meropenem.   AML Pancytopenia -She had bone marrow biopsy on3/13/25; showing ~7% blasts in a ~20% cellular marrow concerning for recurrence of disease. Patient to follow-up with primary oncologist, Dr. Lowella Bandy on Monday. Dr Mosetta Putt will see patient in consultation. Follow recommendations.  Monitor platelet count. Received one unit PRBC 4/05. Hb increased to 8.7 WBC increased to 1.3 Dr Leonides Schanz to see patient  today  Cardiomyopathy associated chemotherapy,  ejection fraction July 20 24--25%.  Has improved since that to 55% hide 2D echo 04/19/2023. -On metoprolol, spironolactone and  hold losartan.    Hypokalemia: Replaced orally.   Bradycardia; Holder parameter for metoprolol added.   Mild hyponatremia; monitor.   Estimated body mass index is 19.22 kg/m as calculated from the following:   Height as of this encounter: 5\' 4"  (1.626 m).   Weight as of this encounter: 50.8 kg.   DVT prophylaxis: lovenox Code Status: Full code Family Communication: sister 4/05---4/07 Disposition Plan:  Status is: Inpatient Remains inpatient appropriate because: management of neutropenic fever. , awaiting Dr Leonides Schanz evaluation to determine discharge date    Consultants:  Oncology   Procedures:    Antimicrobials:    Subjective: Doing well, report dry cough.  No new complaints.   Objective: Vitals:   05/13/23 1315 05/13/23 2208 05/14/23 0555 05/14/23 0952  BP: 131/60 137/62 139/63 (!) 147/64  Pulse: 60 73 (!) 55 63  Resp: 18 18 17    Temp: 99.4 F (37.4 C) 99.8 F (37.7 C) 98.4 F (36.9 C)   TempSrc: Oral Oral Oral   SpO2: 100% 96% 98%   Weight:      Height:        Intake/Output Summary (Last 24 hours) at 05/14/2023 1229 Last data filed at 05/13/2023 1646 Gross per 24 hour  Intake 120 ml  Output --  Net 120 ml   Filed Weights   05/10/23 1953  Weight: 50.8 kg    Examination:  General exam: NAD Respiratory system: CTA Cardiovascular system: S 1, S 2 RRR Gastrointestinal system: BS present, soft, nt Central nervous system: Alert,  follows command Extremities: No edema    Data Reviewed: I have personally reviewed following labs and imaging studies  CBC: Recent Labs  Lab 05/10/23 2014 05/11/23 0417 05/12/23 0900 05/12/23 2016 05/13/23 1107 05/14/23 0408  WBC 1.0* 1.0* 1.0*  --  1.2* 1.3*  NEUTROABS 0.0* 0.1* 0.1*  --  0.2* 0.2*  HGB 8.7* 7.5* 7.4* 9.3* 8.7* 9.0*  HCT  27.7* 23.6* 24.3* 28.5* 26.3* 27.0*  MCV 94.5 95.2 98.0  --  90.4 90.9  PLT 96* 76* 82*  --  99* 116*   Basic Metabolic Panel: Recent Labs  Lab 05/10/23 0743 05/10/23 2014 05/11/23 0417 05/12/23 0900 05/13/23 1107 05/14/23 0408  NA 138 135 137 136 133* 135  K 3.8 3.8 3.3* 3.6 3.2* 3.9  CL 101 101 104 105 100 103  CO2 27 24 25 24 22 22   GLUCOSE 121* 115* 106* 130* 115* 106*  BUN 14 16 14 16 19 19   CREATININE 0.78 0.79 0.64 0.75 0.59 0.64  CALCIUM 9.1 8.7* 8.4* 8.7* 8.4* 8.6*  MG 1.7  --   --   --   --   --    GFR: Estimated Creatinine Clearance: 45 mL/min (by C-G formula based on SCr of 0.64 mg/dL). Liver Function Tests: Recent Labs  Lab 05/10/23 0743 05/10/23 2014 05/11/23 0417  AST 30 34 25  ALT 39 39 33  ALKPHOS 140* 121 98  BILITOT 1.3* 1.3* 1.4*  PROT 7.0 6.6 6.0*  ALBUMIN 3.3* 2.6* 2.3*   No results for input(s): "LIPASE", "AMYLASE" in the last 168 hours. No results for input(s): "AMMONIA" in the last 168 hours. Coagulation Profile: No results for input(s): "INR", "PROTIME" in the last 168 hours. Cardiac Enzymes: No results for input(s): "CKTOTAL", "CKMB", "CKMBINDEX", "TROPONINI" in the last 168 hours. BNP (last 3 results) No results for input(s): "PROBNP" in the last 8760 hours. HbA1C: No results for input(s): "HGBA1C" in the last 72 hours. CBG: No results for input(s): "GLUCAP" in the last 168 hours.  Lipid Profile: No results for input(s): "CHOL", "HDL", "LDLCALC", "TRIG", "CHOLHDL", "LDLDIRECT" in the last 72 hours. Thyroid Function Tests: Recent Labs    05/13/23 1107  TSH 3.949   Anemia Panel: No results for input(s): "VITAMINB12", "FOLATE", "FERRITIN", "TIBC", "IRON", "RETICCTPCT" in the last 72 hours. Sepsis Labs: Recent Labs  Lab 05/10/23 2015  LATICACIDVEN 1.0    Recent Results (from the past 240 hours)  Culture, blood (routine x 2)     Status: None   Collection Time: 05/07/23  9:26 AM   Specimen: BLOOD  Result Value Ref Range  Status   Specimen Description   Final    BLOOD SITE NOT SPECIFIED Performed at [facility], [address], [place], [place] [address]    Special Requests   Final    BOTTLES DRAWN AEROBIC AND ANAEROBIC Blood Culture results may not be optimal due to an inadequate volume of blood received in culture bottles Performed at [facility], [address] [place], [place] [address]    Culture   Final    NO GROWTH 5 DAYS Performed at [facility], [address] [place], [place] [address]    Report Status 05/12/2023 FINAL  Final  Resp panel by RT-PCR (RSV, Flu A&B, Covid) Anterior Nasal Swab     Status: None   Collection Time: 05/07/23  9:27 AM   Specimen: Anterior Nasal Swab  Result Value Ref Range Status   SARS Coronavirus 2 by  RT PCR NEGATIVE NEGATIVE Final    Comment: (NOTE) SARS-CoV-2 target nucleic acids are NOT DETECTED.  The SARS-CoV-2 RNA is generally detectable in upper respiratory specimens during the acute phase of infection. The lowest concentration of SARS-CoV-2 viral copies this assay can detect is 138 copies/mL. A negative result does not preclude SARS-Cov-2 infection and should not be used as the sole basis for treatment or other patient management decisions. A negative result may occur with  improper specimen collection/handling, submission of specimen other than nasopharyngeal swab, presence of viral mutation(s) within the areas targeted by this assay, and inadequate number of viral copies(<138 copies/mL). A negative result must be combined with clinical observations, patient history, and epidemiological information. The expected result is Negative.  Fact Sheet for Patients:  BloggerCourse.com  Fact Sheet for Healthcare Providers:  SeriousBroker.it  This test is no t yet approved or cleared by the [place] FDA and  has been authorized for detection and/or diagnosis  of SARS-CoV-2 by FDA under an Emergency Use Authorization (EUA). This EUA will remain  in effect (meaning this test can be used) for the duration of the COVID-19 declaration under Section 564(b)(1) of the Act, 21 U.S.C.section 360bbb-3(b)(1), unless the authorization is terminated  or revoked sooner.       Influenza A by PCR NEGATIVE NEGATIVE Final   Influenza B by PCR NEGATIVE NEGATIVE Final    Comment: (NOTE) The Xpert Xpress SARS-CoV-2/FLU/RSV plus assay is intended as an aid in the diagnosis of influenza from Nasopharyngeal swab specimens and should not be used as a sole basis for treatment. Nasal washings and aspirates are unacceptable for Xpert Xpress SARS-CoV-2/FLU/RSV testing.  Fact Sheet for Patients: BloggerCourse.com  Fact Sheet for Healthcare Providers: SeriousBroker.it  This test is not yet approved or cleared by the [place] FDA and has been authorized for detection and/or diagnosis of SARS-CoV-2 by FDA under an Emergency Use Authorization (EUA). This EUA will remain in effect (meaning this test can be used) for the duration of the COVID-19 declaration under Section 564(b)(1) of the Act, 21 U.S.C. section 360bbb-3(b)(1), unless the authorization is terminated or revoked.     Resp Syncytial Virus by PCR NEGATIVE NEGATIVE Final    Comment: (NOTE) Fact Sheet for Patients: BloggerCourse.com  Fact Sheet for Healthcare Providers: SeriousBroker.it  This test is not yet approved or cleared by the [place] FDA and has been authorized for detection and/or diagnosis of SARS-CoV-2 by FDA under an Emergency Use Authorization (EUA). This EUA will remain in effect (meaning this test can be used) for the duration of the COVID-19 declaration under Section 564(b)(1) of the Act, 21 U.S.C. section 360bbb-3(b)(1), unless the authorization is terminated  or revoked.  Performed at [facility], [address] [place], [place] [address]   Resp panel by RT-PCR (RSV, Flu A&B, Covid) Anterior Nasal Swab     Status: None   Collection Time: 05/10/23  9:04 PM   Specimen: Anterior Nasal Swab  Result Value Ref Range Status   SARS Coronavirus 2 by RT PCR NEGATIVE NEGATIVE Final    Comment: (NOTE) SARS-CoV-2 target nucleic acids are NOT DETECTED.  The SARS-CoV-2 RNA is generally detectable in upper respiratory specimens during the acute phase of infection. The lowest concentration of SARS-CoV-2 viral copies this assay can detect is 138 copies/mL. A negative result does not preclude SARS-Cov-2 infection and should not be used as the sole basis for treatment or other patient management decisions. A negative result may occur with  improper specimen collection/handling, submission of specimen other than nasopharyngeal swab, presence of viral mutation(s) within the areas targeted by this assay, and inadequate number of viral copies(<138 copies/mL). A negative result must be combined with clinical observations, patient history, and epidemiological information. The expected result is Negative.  Fact Sheet for Patients:  BloggerCourse.com  Fact Sheet for Healthcare Providers:  SeriousBroker.it  This test is no t yet approved or cleared by the [place] FDA and  has been authorized for detection and/or diagnosis of SARS-CoV-2 by FDA under an Emergency Use Authorization (EUA). This EUA will remain  in effect (meaning this test can be used) for the duration of the COVID-19 declaration under Section 564(b)(1) of the Act, 21 U.S.C.section 360bbb-3(b)(1), unless the authorization is terminated  or revoked sooner.       Influenza A by PCR NEGATIVE NEGATIVE Final   Influenza B by PCR NEGATIVE NEGATIVE Final    Comment: (NOTE) The Xpert Xpress SARS-CoV-2/FLU/RSV plus assay is  intended as an aid in the diagnosis of influenza from Nasopharyngeal swab specimens and should not be used as a sole basis for treatment. Nasal washings and aspirates are unacceptable for Xpert Xpress SARS-CoV-2/FLU/RSV testing.  Fact Sheet for Patients: BloggerCourse.com  Fact Sheet for Healthcare Providers: SeriousBroker.it  This test is not yet approved or cleared by the [place] FDA and has been authorized for detection and/or diagnosis of SARS-CoV-2 by FDA under an Emergency Use Authorization (EUA). This EUA will remain in effect (meaning this test can be used) for the duration of the COVID-19 declaration under Section 564(b)(1) of the Act, 21 U.S.C. section 360bbb-3(b)(1), unless the authorization is terminated or revoked.     Resp Syncytial Virus by PCR NEGATIVE NEGATIVE Final    Comment: (NOTE) Fact Sheet for Patients: BloggerCourse.com  Fact Sheet for Healthcare Providers: SeriousBroker.it  This test is not yet approved or cleared by the [place] FDA and has been authorized for detection and/or diagnosis of SARS-CoV-2 by FDA under an Emergency Use Authorization (EUA). This EUA will remain in effect (meaning this test can be used) for the duration of the COVID-19 declaration under Section 564(b)(1) of the Act, 21 U.S.C. section 360bbb-3(b)(1), unless the authorization is terminated or revoked.  Performed at [facility], [address], [place], [place] [address]   Blood culture (routine x 2)     Status: None (Preliminary result)   Collection Time: 05/10/23  9:49 PM   Specimen: BLOOD RIGHT FOREARM  Result Value Ref Range Status   Specimen Description   Final    BLOOD RIGHT FOREARM Performed at [facility], [address] [place], [place] [address]    Special Requests   Final    BOTTLES DRAWN AEROBIC AND ANAEROBIC Blood Culture  adequate volume Performed at [facility], [address], [place], [place] [address]    Culture   Final    NO GROWTH 4 DAYS Performed at [facility], [address], [place], [place] [address]    Report Status PENDING  Incomplete  Blood culture (routine x 2)     Status: None (Preliminary result)   Collection Time: 05/10/23  9:51 PM   Specimen: BLOOD LEFT HAND  Result Value Ref Range Status   Specimen Description   Final    BLOOD LEFT HAND Performed at [facility], [address] [place], [place] [address]    Special Requests   Final    BOTTLES DRAWN AEROBIC  AND ANAEROBIC Blood Culture results may not be optimal due to an inadequate volume of blood received in culture bottles Performed at [facility], [address], [place], [place] [address]    Culture   Final    NO GROWTH 4 DAYS Performed at [facility], [address] [place], [place] [address]    Report Status PENDING  Incomplete  Respiratory (~20 pathogens) panel by PCR     Status: None   Collection Time: 05/11/23 11:25 AM   Specimen: Nasopharyngeal Swab; Respiratory  Result Value Ref Range Status   Adenovirus NOT DETECTED NOT DETECTED Final   Coronavirus 229E NOT DETECTED NOT DETECTED Final    Comment: (NOTE) The Coronavirus on the Respiratory Panel, DOES NOT test for the novel  Coronavirus (2019 nCoV)    Coronavirus HKU1 NOT DETECTED NOT DETECTED Final   Coronavirus NL63 NOT DETECTED NOT DETECTED Final   Coronavirus OC43 NOT DETECTED NOT DETECTED Final   Metapneumovirus NOT DETECTED NOT DETECTED Final   Rhinovirus / Enterovirus NOT DETECTED NOT DETECTED Final   Influenza A NOT DETECTED NOT DETECTED Final   Influenza B NOT DETECTED NOT DETECTED Final   Parainfluenza Virus 1 NOT DETECTED NOT DETECTED Final   Parainfluenza Virus 2 NOT DETECTED NOT DETECTED Final   Parainfluenza Virus 3 NOT DETECTED NOT DETECTED Final   Parainfluenza Virus 4 NOT DETECTED  NOT DETECTED Final   Respiratory Syncytial Virus NOT DETECTED NOT DETECTED Final   Bordetella pertussis NOT DETECTED NOT DETECTED Final   Bordetella Parapertussis NOT DETECTED NOT DETECTED Final   Chlamydophila pneumoniae NOT DETECTED NOT DETECTED Final   Mycoplasma pneumoniae NOT DETECTED NOT DETECTED Final    Comment: Performed at [facility], [address], [place], [place] [address]         Radiology Studies: No results found.       Scheduled Meds:  acyclovir  400 mg Oral BID   Chlorhexidine Gluconate Cloth  6 each Topical Daily   vitamin B-12  1,000 mcg Oral Daily   enoxaparin (LOVENOX) injection  40 mg Subcutaneous Q24H   feeding supplement  1 Container Oral BID BM   guaiFENesin  1,200 mg Oral BID   levothyroxine  50 mcg Oral Q0600   metoprolol succinate  25 mg Oral Daily   posaconazole  300 mg Oral Daily   spironolactone  25 mg Oral Daily   zinc sulfate (50mg  elemental zinc)  220 mg Oral Daily   Continuous Infusions:  ceFEPime (MAXIPIME) IV 2 g (05/14/23 1002)   vancomycin 750 mg (05/14/23 0006)     LOS: 4 days    Time spent: 35 minutes    Daquane Aguilar A Taesean Reth, MD [facility]   If 7PM-7AM, please contact night-coverage [email or web address]  05/14/2023, 12:29 PM

## 2023-05-14 NOTE — Progress Notes (Signed)
 Hematology/Oncology Progress Note  Clinical Summary: Mrs. Neyda Durango is an 83 year old female with medical history significant for AML currently comanaged with [facility] who is admitted with pneumonia.  Interval History: -- Patient has been afebrile over the last 48 hours and finally stable. -- Hemoglobin improved to 9.0, remains neutropenic. -- patient reports low grade temperatures without frank fever -- denies nausea, vomiting, diarrhea -- no cough, runny nose, or sore throat.   O:  Vitals:   05/14/23 0952 05/14/23 1428  BP: (!) 147/64 (!) 154/82  Pulse: 63 69  Resp:  19  Temp:  100.2 F (37.9 C)  SpO2:  97%      Latest Ref Rng & Units 05/14/2023    4:08 AM 05/13/2023   11:07 AM 05/12/2023    9:00 AM  CMP  Glucose 70 - 99 mg/dL 161  096  045   BUN 8 - 23 mg/dL 19  19  16    Creatinine 0.44 - 1.00 mg/dL 4.09  8.11  9.14   Sodium 135 - 145 mmol/L 135  133  136   Potassium 3.5 - 5.1 mmol/L 3.9  3.2  3.6   Chloride 98 - 111 mmol/L 103  100  105   CO2 22 - 32 mmol/L 22  22  24    Calcium 8.9 - 10.3 mg/dL 8.6  8.4  8.7       Latest Ref Rng & Units 05/14/2023    4:08 AM 05/13/2023   11:07 AM 05/12/2023    8:16 PM  CBC  WBC 4.0 - 10.5 K/uL 1.3  1.2    Hemoglobin 12.0 - 15.0 g/dL 9.0  8.7  9.3   Hematocrit 36.0 - 46.0 % 27.0  26.3  28.5   Platelets 150 - 400 K/uL 116  99        GENERAL: well appearing elderly Caucasian female in NAD  SKIN: skin color, texture, turgor are normal, no rashes or significant lesions EYES: conjunctiva are pink and non-injected, sclera clear LUNGS: clear to auscultation and percussion with normal breathing effort HEART: regular rate & rhythm and no murmurs and no lower extremity edema Musculoskeletal: no cyanosis of digits and no clubbing  PSYCH: alert & oriented x 3, fluent speech NEURO: no focal motor/sensory deficits  Assessment/Plan:  Mrs. Laylia Mui is an 83 year old female with medical history significant for AML  currently comanaged with [facility] who is admitted with pneumonia.  # Neutropenia in Setting of AML # Pneumonia -- Agree with broad-spectrum antibiotics with Vancomycin and cefepime. -- No indication for G-CSF as the patient has AML -- Labs today show white blood cell 1.3, hemoglobin 9.0, MCV 90.9, platelets 116.  ANC 0.2 -- recommend transfusion for Hgb <8.0 or Plt <20  --continue best supportive care for now. Will begin discussing GOC with patient  -- will discuss her care with [facility]  --Oncology service will continue to follow    Ulysees Barns, MD Department of Hematology/Oncology [facility] Phone: [phone] Pager: [phone] Email: [email or web address]

## 2023-05-15 DIAGNOSIS — C9200 Acute myeloblastic leukemia, not having achieved remission: Secondary | ICD-10-CM | POA: Diagnosis not present

## 2023-05-15 LAB — CULTURE, BLOOD (ROUTINE X 2)
Culture: NO GROWTH
Culture: NO GROWTH
Special Requests: ADEQUATE

## 2023-05-15 LAB — CBC WITH DIFFERENTIAL/PLATELET
Abs Immature Granulocytes: 0.08 10*3/uL — ABNORMAL HIGH (ref 0.00–0.07)
Basophils Absolute: 0 10*3/uL (ref 0.0–0.1)
Basophils Relative: 0 %
Eosinophils Absolute: 0 10*3/uL (ref 0.0–0.5)
Eosinophils Relative: 0 %
HCT: 26.4 % — ABNORMAL LOW (ref 36.0–46.0)
Hemoglobin: 8.2 g/dL — ABNORMAL LOW (ref 12.0–15.0)
Immature Granulocytes: 9 %
Lymphocytes Relative: 53 %
Lymphs Abs: 0.5 10*3/uL — ABNORMAL LOW (ref 0.7–4.0)
MCH: 29.1 pg (ref 26.0–34.0)
MCHC: 31.1 g/dL (ref 30.0–36.0)
MCV: 93.6 fL (ref 80.0–100.0)
Monocytes Absolute: 0.2 10*3/uL (ref 0.1–1.0)
Monocytes Relative: 19 %
Neutro Abs: 0.2 10*3/uL — CL (ref 1.7–7.7)
Neutrophils Relative %: 19 %
Platelets: 111 10*3/uL — ABNORMAL LOW (ref 150–400)
RBC: 2.82 MIL/uL — ABNORMAL LOW (ref 3.87–5.11)
RDW: 19.7 % — ABNORMAL HIGH (ref 11.5–15.5)
Smear Review: NORMAL
WBC: 0.9 10*3/uL — CL (ref 4.0–10.5)
nRBC: 2.1 % — ABNORMAL HIGH (ref 0.0–0.2)

## 2023-05-15 LAB — BASIC METABOLIC PANEL WITH GFR
Anion gap: 9 (ref 5–15)
BUN: 20 mg/dL (ref 8–23)
CO2: 22 mmol/L (ref 22–32)
Calcium: 8.2 mg/dL — ABNORMAL LOW (ref 8.9–10.3)
Chloride: 103 mmol/L (ref 98–111)
Creatinine, Ser: 0.62 mg/dL (ref 0.44–1.00)
GFR, Estimated: >60 mL/min (ref >60)
Glucose, Bld: 142 mg/dL — ABNORMAL HIGH (ref 70–99)
Potassium: 3.1 mmol/L — ABNORMAL LOW (ref 3.5–5.1)
Sodium: 134 mmol/L — ABNORMAL LOW (ref 135–145)

## 2023-05-15 MED ORDER — POLYETHYLENE GLYCOL 3350 17 G PO PACK
17.0000 g | PACK | Freq: Two times a day (BID) | ORAL | Status: DC
  Administered 2023-05-15 – 2023-05-16 (×3): 17 g via ORAL
  Filled 2023-05-15 (×3): qty 1

## 2023-05-15 MED ORDER — POTASSIUM CHLORIDE CRYS ER 20 MEQ PO TBCR
40.0000 meq | EXTENDED_RELEASE_TABLET | Freq: Once | ORAL | Status: AC
  Administered 2023-05-15: 40 meq via ORAL
  Filled 2023-05-15: qty 2

## 2023-05-15 NOTE — Progress Notes (Signed)
 Pharmacy Antibiotic Note  [name] is a 83 y.o. female admitted on 05/10/2023 with fever and weakness. Pharmacy has been consulted to dose vancomycin and cefepime for pneumonia and neutropenic fever.  Patient is now afebrile. ANC 0.2. Oncology following. Today is day 5 of antibiotics  Plan: -Vancomycin 750mg  q24h -Cefepime 2gm IV q12h -Continue to follow clinical course for length of antibiotic therapy  Height: 5\' 4"  (162.6 cm) Weight: 50.8 kg (111 lb 15.9 oz) IBW/kg (Calculated) : 54.7  Temp (24hrs), Avg:98.8 F (37.1 C), Min:98.1 F (36.7 C), Max:100.2 F (37.9 C)  Recent Labs  Lab 05/10/23 2014 05/10/23 2015 05/11/23 0417 05/12/23 0900 05/13/23 1107 05/14/23 0408  WBC 1.0*  --  1.0* 1.0* 1.2* 1.3*  CREATININE 0.79  --  0.64 0.75 0.59 0.64  LATICACIDVEN  --  1.0  --   --   --   --     Estimated Creatinine Clearance: 45 mL/min (by C-G formula based on SCr of 0.64 mg/dL).    Allergies  Allergen Reactions   Ambien [Zolpidem]     Intolerant.   Codeine Nausea And Vomiting   Dust Mite Extract Cough   Grass Pollen(K-O-R-T-Swt Vern) Cough   Hydrocodone Nausea Only   Statins     Muscle pain   Sulfa Antibiotics Nausea And Vomiting   Zofran [Ondansetron]     Inc in anxiety and doesn't help with nausea.      Antimicrobials this admission: Cefepime 4/3 >> Vancomycin 4/4 >>  Microbiology results: 4/3 BCx: ngtd 4/4 Resp panel: negative  Thank you for allowing pharmacy to be a part of this patient's care.  Pricilla Riffle, PharmD, BCPS Clinical Pharmacist 05/15/2023 7:27 AM

## 2023-05-15 NOTE — Progress Notes (Signed)
 Mobility Specialist - Progress Note   05/15/23 0933  Mobility  Activity Ambulated independently in hallway  Level of Assistance Independent  Assistive Device None  Distance Ambulated (ft) 350 ft  Activity Response Tolerated well  Mobility Referral Yes  Mobility visit 1 Mobility  Mobility Specialist Start Time (ACUTE ONLY) 0929  Mobility Specialist Stop Time (ACUTE ONLY) 0932  Mobility Specialist Time Calculation (min) (ACUTE ONLY) 3 min   Pt received in bed and agreeable to mobility. No complaints during session. Pt to bathroom after session with all needs met.    [facility]

## 2023-05-15 NOTE — Plan of Care (Signed)
   Problem: Nutrition: Goal: Adequate nutrition will be maintained Outcome: Progressing   Problem: Pain Managment: Goal: General experience of comfort will improve and/or be controlled Outcome: Progressing   Problem: Safety: Goal: Ability to remain free from injury will improve Outcome: Progressing

## 2023-05-15 NOTE — Progress Notes (Signed)
 PROGRESS NOTE    [name]  QIO:[id number] DOB: 06-19-1940 DOA: 05/10/2023 PCP: [name], MD   Brief Narrative: 72 with past medical history significant for AML followed by Dr. Leonides Schanz and Dr. Lowella Bandy from [facility], recently admitted at [facility] from 04/19/2023 through 04/29/2023, for neutropenic fever with multifocal pneumonia was treated with 10 days of antibiotics and supposed to have a follow-up with oncologist next Monday.  Patient presented with progressive weakness, developed subjective fever.  Had a temperature of 100 at home.  Evaluation in the ED patient was found to have a temperature of 103, white count of 1.  Admitted for recurrent neutropenic fever.   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  -Continue with broad spectrum antibiotics: Vancomycin and Cefepime.  -Blood Culture. No growth  -Respiratory panel. negative -Covid, Influenza, RSV negative.  -Oncology consulted.  -Continue with current care.  -Dr Leonides Schanz recommend support care. He will discussed care with oncology at [facility]  -had Low grade fever 4/07. Monitor.   AML Pancytopenia: -She had bone marrow biopsy on3/13/25; showing ~7% blasts in a ~20% cellular marrow concerning for recurrence of disease. Patient to follow-up with primary oncologist, Dr. Lowella Bandy on Monday. Dr Mosetta Putt will see patient in consultation. Follow recommendations.  Monitor platelet count. Received one unit PRBC 4/05. Hb increased to 8.7 WBC increased to 1.3 Dr Leonides Schanz will discussed with oncology at [facility] further care. He will also  discussed goals of care.   Cardiomyopathy associated chemotherapy,  ejection fraction July 20 24--25%.  Has improved since that to 55% hide 2D echo 04/19/2023. -On metoprolol, spironolactone and  hold losartan.    Hypokalemia: Replaced orally.   Bradycardia; Holder parameter for metoprolol added.   Mild hyponatremia; monitor.   Estimated body mass index is 19.22 kg/m as calculated from the following:   Height as of this encounter: 5\' 4"  (1.626 m).   Weight as of this encounter: 50.8 kg.   DVT prophylaxis: lovenox Code Status: Full code Family Communication: sister 4/05---4/07 Disposition Plan:  Status is: Inpatient Remains inpatient appropriate because: management of neutropenic fever. , awaiting Dr Leonides Schanz evaluation to determine discharge date    Consultants:  Oncology   Procedures:    Antimicrobials:    Subjective: Constipation, started miralax.  No new complaints. No worsening cough  Objective: Vitals:   05/14/23 1428 05/14/23 2116 05/15/23 0606 05/15/23 0949  BP: (!) 154/82 135/60 (!) 141/61 126/69  Pulse: 69 (!) 56 76 65  Resp: 19 18 18    Temp: 100.2 F (37.9 C) 98.1 F (36.7 C) 98.2 F (36.8 C)   TempSrc: Oral Oral Oral   SpO2: 97% 96% 99%   Weight:      Height:        Intake/Output Summary (Last 24 hours) at 05/15/2023 1418 Last data filed at 05/14/2023 1500 Gross per 24 hour  Intake 120 ml  Output --  Net 120 ml   Filed Weights   05/10/23 1953  Weight: 50.8 kg    Examination:  General exam: NAD Respiratory system: CTA Cardiovascular system: S 1, S 2 RRR Gastrointestinal system: BS present, soft, nt  Central nervous system: alert Extremities: No edema    Data Reviewed: I have personally reviewed following labs and imaging studies  CBC: Recent Labs  Lab 05/11/23 0417 05/12/23 0900 05/12/23 2016 05/13/23 1107 05/14/23 0408 05/15/23 0839  WBC 1.0* 1.0*  --  1.2* 1.3* 0.9*  NEUTROABS 0.1* 0.1*  --  0.2* 0.2* 0.2*  HGB 7.5* 7.4*  9.3* 8.7* 9.0* 8.2*  HCT 23.6* 24.3* 28.5* 26.3* 27.0* 26.4*  MCV 95.2 98.0  --  90.4 90.9 93.6  PLT 76* 82*  --  99* 116* 111*   Basic Metabolic Panel: Recent Labs  Lab 05/10/23 0743 05/10/23 2014 05/11/23 0417 05/12/23 0900 05/13/23 1107 05/14/23 0408 05/15/23 0839  NA 138   < > 137 136 133* 135 134*  K 3.8   < > 3.3* 3.6 3.2* 3.9 3.1*  CL 101   < > 104 105 100 103 103  CO2 27   < > 25 24 22 22 22   GLUCOSE 121*   < > 106* 130* 115* 106* 142*  BUN 14   < > 14 16 19 19 20   CREATININE 0.78   < > 0.64 0.75 0.59 0.64 0.62  CALCIUM 9.1   < > 8.4* 8.7* 8.4* 8.6* 8.2*  MG 1.7  --   --   --   --   --   --    < > = values in this interval not displayed.   GFR: Estimated Creatinine Clearance: 45 mL/min (by C-G formula based on SCr of 0.62 mg/dL). Liver Function Tests: Recent Labs  Lab 05/10/23 0743 05/10/23 2014 05/11/23 0417  AST 30 34 25  ALT 39 39 33  ALKPHOS 140* 121 98  BILITOT 1.3* 1.3* 1.4*  PROT 7.0 6.6 6.0*  ALBUMIN 3.3* 2.6* 2.3*   No results for input(s): "LIPASE", "AMYLASE" in the last 168 hours. No results for input(s): "AMMONIA" in the last 168 hours. Coagulation Profile: No results for input(s): "INR", "PROTIME" in the last 168 hours. Cardiac Enzymes: No results for input(s): "CKTOTAL", "CKMB", "CKMBINDEX", "TROPONINI" in the last 168 hours. BNP (last 3 results) No results for input(s): "PROBNP" in the last 8760 hours. HbA1C: No results for input(s): "HGBA1C" in the last 72 hours. CBG: No results for input(s): "GLUCAP" in the last 168 hours.  Lipid Profile: No results for input(s): "CHOL", "HDL", "LDLCALC", "TRIG", "CHOLHDL", "LDLDIRECT" in the last 72 hours. Thyroid Function Tests: Recent Labs    05/13/23 1107  TSH 3.949   Anemia Panel: No results for input(s): "VITAMINB12", "FOLATE", "FERRITIN", "TIBC", "IRON", "RETICCTPCT" in the last 72 hours. Sepsis Labs: Recent Labs  Lab 05/10/23 2015  LATICACIDVEN 1.0    Recent Results (from the past  240 hours)  Culture, blood (routine x 2)     Status: None   Collection Time: 05/07/23  9:26 AM   Specimen: BLOOD  Result Value Ref Range Status   Specimen Description   Final    BLOOD SITE NOT SPECIFIED Performed at [facility], [address], [place], [place] [address]    Special Requests   Final    BOTTLES DRAWN AEROBIC AND ANAEROBIC Blood Culture results may not be optimal due to an inadequate volume of blood received in culture bottles Performed at [facility], [address] [place], [place] [address]    Culture   Final    NO GROWTH 5 DAYS Performed at [facility], [address] [place], [place] [address]  Report Status 05/12/2023 FINAL  Final  Resp panel by RT-PCR (RSV, Flu A&B, Covid) Anterior Nasal Swab     Status: None   Collection Time: 05/07/23  9:27 AM   Specimen: Anterior Nasal Swab  Result Value Ref Range Status   SARS Coronavirus 2 by RT PCR NEGATIVE NEGATIVE Final    Comment: (NOTE) SARS-CoV-2 target nucleic acids are NOT DETECTED.  The SARS-CoV-2 RNA is generally detectable in upper respiratory specimens during the acute phase of infection. The lowest concentration of SARS-CoV-2 viral copies this assay can detect is 138 copies/mL. A negative result does not preclude SARS-Cov-2 infection and should not be used as the sole basis for treatment or other patient management decisions. A negative result may occur with  improper specimen collection/handling, submission of specimen other than nasopharyngeal swab, presence of viral mutation(s) within the areas targeted by this assay, and inadequate number of viral copies(<138 copies/mL). A negative result must be combined with clinical observations, patient history, and epidemiological information. The expected result is Negative.  Fact Sheet for Patients:  BloggerCourse.com  Fact Sheet for Healthcare Providers:   SeriousBroker.it  This test is no t yet approved or cleared by the [place] FDA and  has been authorized for detection and/or diagnosis of SARS-CoV-2 by FDA under an Emergency Use Authorization (EUA). This EUA will remain  in effect (meaning this test can be used) for the duration of the COVID-19 declaration under Section 564(b)(1) of the Act, 21 U.S.C.section 360bbb-3(b)(1), unless the authorization is terminated  or revoked sooner.       Influenza A by PCR NEGATIVE NEGATIVE Final   Influenza B by PCR NEGATIVE NEGATIVE Final    Comment: (NOTE) The Xpert Xpress SARS-CoV-2/FLU/RSV plus assay is intended as an aid in the diagnosis of influenza from Nasopharyngeal swab specimens and should not be used as a sole basis for treatment. Nasal washings and aspirates are unacceptable for Xpert Xpress SARS-CoV-2/FLU/RSV testing.  Fact Sheet for Patients: BloggerCourse.com  Fact Sheet for Healthcare Providers: SeriousBroker.it  This test is not yet approved or cleared by the [place] FDA and has been authorized for detection and/or diagnosis of SARS-CoV-2 by FDA under an Emergency Use Authorization (EUA). This EUA will remain in effect (meaning this test can be used) for the duration of the COVID-19 declaration under Section 564(b)(1) of the Act, 21 U.S.C. section 360bbb-3(b)(1), unless the authorization is terminated or revoked.     Resp Syncytial Virus by PCR NEGATIVE NEGATIVE Final    Comment: (NOTE) Fact Sheet for Patients: BloggerCourse.com  Fact Sheet for Healthcare Providers: SeriousBroker.it  This test is not yet approved or cleared by the [place] FDA and has been authorized for detection and/or diagnosis of SARS-CoV-2 by FDA under an Emergency Use Authorization (EUA). This EUA will remain in effect (meaning this test can be used) for  the duration of the COVID-19 declaration under Section 564(b)(1) of the Act, 21 U.S.C. section 360bbb-3(b)(1), unless the authorization is terminated or revoked.  Performed at [facility], [address], [place], [place] [address]   Resp panel by RT-PCR (RSV, Flu A&B, Covid) Anterior Nasal Swab     Status: None   Collection Time: 05/10/23  9:04 PM   Specimen: Anterior Nasal Swab  Result Value Ref Range Status   SARS Coronavirus 2 by RT PCR NEGATIVE NEGATIVE Final    Comment: (NOTE) SARS-CoV-2 target nucleic acids are NOT DETECTED.  The SARS-CoV-2 RNA is generally detectable in upper respiratory specimens during  the acute phase of infection. The lowest concentration of SARS-CoV-2 viral copies this assay can detect is 138 copies/mL. A negative result does not preclude SARS-Cov-2 infection and should not be used as the sole basis for treatment or other patient management decisions. A negative result may occur with  improper specimen collection/handling, submission of specimen other than nasopharyngeal swab, presence of viral mutation(s) within the areas targeted by this assay, and inadequate number of viral copies(<138 copies/mL). A negative result must be combined with clinical observations, patient history, and epidemiological information. The expected result is Negative.  Fact Sheet for Patients:  BloggerCourse.com  Fact Sheet for Healthcare Providers:  SeriousBroker.it  This test is no t yet approved or cleared by the [place] FDA and  has been authorized for detection and/or diagnosis of SARS-CoV-2 by FDA under an Emergency Use Authorization (EUA). This EUA will remain  in effect (meaning this test can be used) for the duration of the COVID-19 declaration under Section 564(b)(1) of the Act, 21 U.S.C.section 360bbb-3(b)(1), unless the authorization is terminated  or revoked sooner.       Influenza  A by PCR NEGATIVE NEGATIVE Final   Influenza B by PCR NEGATIVE NEGATIVE Final    Comment: (NOTE) The Xpert Xpress SARS-CoV-2/FLU/RSV plus assay is intended as an aid in the diagnosis of influenza from Nasopharyngeal swab specimens and should not be used as a sole basis for treatment. Nasal washings and aspirates are unacceptable for Xpert Xpress SARS-CoV-2/FLU/RSV testing.  Fact Sheet for Patients: BloggerCourse.com  Fact Sheet for Healthcare Providers: SeriousBroker.it  This test is not yet approved or cleared by the [place] FDA and has been authorized for detection and/or diagnosis of SARS-CoV-2 by FDA under an Emergency Use Authorization (EUA). This EUA will remain in effect (meaning this test can be used) for the duration of the COVID-19 declaration under Section 564(b)(1) of the Act, 21 U.S.C. section 360bbb-3(b)(1), unless the authorization is terminated or revoked.     Resp Syncytial Virus by PCR NEGATIVE NEGATIVE Final    Comment: (NOTE) Fact Sheet for Patients: BloggerCourse.com  Fact Sheet for Healthcare Providers: SeriousBroker.it  This test is not yet approved or cleared by the [place] FDA and has been authorized for detection and/or diagnosis of SARS-CoV-2 by FDA under an Emergency Use Authorization (EUA). This EUA will remain in effect (meaning this test can be used) for the duration of the COVID-19 declaration under Section 564(b)(1) of the Act, 21 U.S.C. section 360bbb-3(b)(1), unless the authorization is terminated or revoked.  Performed at [facility], [address] [place], [place] [address]   Blood culture (routine x 2)     Status: None   Collection Time: 05/10/23  9:49 PM   Specimen: BLOOD RIGHT FOREARM  Result Value Ref Range Status   Specimen Description   Final    BLOOD RIGHT FOREARM Performed at [facility], [address], [place], [place] [address]    Special Requests   Final    BOTTLES DRAWN AEROBIC AND ANAEROBIC Blood Culture adequate volume Performed at [facility], [address] [place], [place] [address]    Culture   Final    NO GROWTH 5 DAYS Performed at [facility], [address], [place], [place] [address]    Report Status 05/15/2023 FINAL  Final  Blood culture (routine x 2)     Status: None   Collection Time: 05/10/23  9:51 PM   Specimen: BLOOD LEFT  HAND  Result Value Ref Range Status   Specimen Description   Final    BLOOD LEFT HAND Performed at [facility], [address] [place], [place] [address]    Special Requests   Final    BOTTLES DRAWN AEROBIC AND ANAEROBIC Blood Culture results may not be optimal due to an inadequate volume of blood received in culture bottles Performed at [facility], [address] [place], [place] [address]    Culture   Final    NO GROWTH 5 DAYS Performed at [facility], [address] [place], [place] [address]    Report Status 05/15/2023 FINAL  Final  Respiratory (~20 pathogens) panel by PCR     Status: None   Collection Time: 05/11/23 11:25 AM   Specimen: Nasopharyngeal Swab; Respiratory  Result Value Ref Range Status   Adenovirus NOT DETECTED NOT DETECTED Final   Coronavirus 229E NOT DETECTED NOT DETECTED Final    Comment: (NOTE) The Coronavirus on the Respiratory Panel, DOES NOT test for the novel  Coronavirus (2019 nCoV)    Coronavirus HKU1 NOT DETECTED NOT DETECTED Final   Coronavirus NL63 NOT DETECTED NOT DETECTED Final   Coronavirus OC43 NOT DETECTED NOT DETECTED Final   Metapneumovirus NOT DETECTED NOT DETECTED Final   Rhinovirus / Enterovirus NOT DETECTED NOT DETECTED Final   Influenza A NOT DETECTED NOT DETECTED Final   Influenza B NOT DETECTED NOT DETECTED Final   Parainfluenza Virus 1 NOT DETECTED NOT DETECTED Final   Parainfluenza Virus 2 NOT  DETECTED NOT DETECTED Final   Parainfluenza Virus 3 NOT DETECTED NOT DETECTED Final   Parainfluenza Virus 4 NOT DETECTED NOT DETECTED Final   Respiratory Syncytial Virus NOT DETECTED NOT DETECTED Final   Bordetella pertussis NOT DETECTED NOT DETECTED Final   Bordetella Parapertussis NOT DETECTED NOT DETECTED Final   Chlamydophila pneumoniae NOT DETECTED NOT DETECTED Final   Mycoplasma pneumoniae NOT DETECTED NOT DETECTED Final    Comment: Performed at [facility], [address] [place], [place] [address]         Radiology Studies: No results found.       Scheduled Meds:  acyclovir  400 mg Oral BID   Chlorhexidine Gluconate Cloth  6 each Topical Daily   vitamin B-12  1,000 mcg Oral Daily   enoxaparin (LOVENOX) injection  40 mg Subcutaneous Q24H   feeding supplement  1 Container Oral BID BM   guaiFENesin  1,200 mg Oral BID   levothyroxine  50 mcg Oral Q0600   metoprolol succinate  25 mg Oral Daily   posaconazole  300 mg Oral Daily   potassium chloride  40 mEq Oral Once   spironolactone  25 mg Oral Daily   zinc sulfate (50mg  elemental zinc)  220 mg Oral Daily   Continuous Infusions:  ceFEPime (MAXIPIME) IV 2 g (05/15/23 0949)   vancomycin 750 mg (05/14/23 2317)     LOS: 5 days    Time spent: 35 minutes    Giovannie Scerbo A Bronwyn Belasco, MD [facility]   If 7PM-7AM, please contact night-coverage [email or web address]  05/15/2023, 2:18 PM

## 2023-05-16 ENCOUNTER — Other Ambulatory Visit (HOSPITAL_COMMUNITY): Payer: Self-pay

## 2023-05-16 ENCOUNTER — Telehealth: Payer: Self-pay | Admitting: *Deleted

## 2023-05-16 DIAGNOSIS — J189 Pneumonia, unspecified organism: Secondary | ICD-10-CM | POA: Diagnosis not present

## 2023-05-16 DIAGNOSIS — C9200 Acute myeloblastic leukemia, not having achieved remission: Secondary | ICD-10-CM | POA: Diagnosis not present

## 2023-05-16 LAB — COMPREHENSIVE METABOLIC PANEL WITH GFR
ALT: 33 U/L (ref 0–44)
AST: 37 U/L (ref 15–41)
Albumin: 2.2 g/dL — ABNORMAL LOW (ref 3.5–5.0)
Alkaline Phosphatase: 114 U/L (ref 38–126)
Anion gap: 8 (ref 5–15)
BUN: 18 mg/dL (ref 8–23)
CO2: 21 mmol/L — ABNORMAL LOW (ref 22–32)
Calcium: 8.3 mg/dL — ABNORMAL LOW (ref 8.9–10.3)
Chloride: 105 mmol/L (ref 98–111)
Creatinine, Ser: 0.59 mg/dL (ref 0.44–1.00)
GFR, Estimated: >60 mL/min (ref >60)
Glucose, Bld: 140 mg/dL — ABNORMAL HIGH (ref 70–99)
Potassium: 3.3 mmol/L — ABNORMAL LOW (ref 3.5–5.1)
Sodium: 134 mmol/L — ABNORMAL LOW (ref 135–145)
Total Bilirubin: 1.1 mg/dL (ref 0.0–1.2)
Total Protein: 6 g/dL — ABNORMAL LOW (ref 6.5–8.1)

## 2023-05-16 LAB — CBC WITH DIFFERENTIAL/PLATELET
Abs Immature Granulocytes: 0.07 10*3/uL (ref 0.00–0.07)
Basophils Absolute: 0 10*3/uL (ref 0.0–0.1)
Basophils Relative: 0 %
Eosinophils Absolute: 0 10*3/uL (ref 0.0–0.5)
Eosinophils Relative: 0 %
HCT: 26.7 % — ABNORMAL LOW (ref 36.0–46.0)
Hemoglobin: 8.2 g/dL — ABNORMAL LOW (ref 12.0–15.0)
Immature Granulocytes: 7 %
Lymphocytes Relative: 61 %
Lymphs Abs: 0.6 10*3/uL — ABNORMAL LOW (ref 0.7–4.0)
MCH: 28.9 pg (ref 26.0–34.0)
MCHC: 30.7 g/dL (ref 30.0–36.0)
MCV: 94 fL (ref 80.0–100.0)
Monocytes Absolute: 0.2 10*3/uL (ref 0.1–1.0)
Monocytes Relative: 15 %
Neutro Abs: 0.2 10*3/uL — CL (ref 1.7–7.7)
Neutrophils Relative %: 17 %
Platelets: 122 10*3/uL — ABNORMAL LOW (ref 150–400)
RBC: 2.84 MIL/uL — ABNORMAL LOW (ref 3.87–5.11)
RDW: 19.4 % — ABNORMAL HIGH (ref 11.5–15.5)
WBC: 1 10*3/uL — CL (ref 4.0–10.5)
nRBC: 2.9 % — ABNORMAL HIGH (ref 0.0–0.2)

## 2023-05-16 MED ORDER — HEPARIN SOD (PORK) LOCK FLUSH 100 UNIT/ML IV SOLN
500.0000 [IU] | INTRAVENOUS | Status: AC | PRN
  Administered 2023-05-16: 500 [IU]

## 2023-05-16 MED ORDER — LEVOFLOXACIN 750 MG PO TABS
750.0000 mg | ORAL_TABLET | ORAL | 0 refills | Status: AC
  Filled 2023-05-16: qty 2, 4d supply, fill #0

## 2023-05-16 NOTE — Plan of Care (Signed)
   Problem: Activity: Goal: Risk for activity intolerance will decrease Outcome: Progressing   Problem: Pain Managment: Goal: General experience of comfort will improve and/or be controlled Outcome: Progressing   Problem: Safety: Goal: Ability to remain free from injury will improve Outcome: Progressing

## 2023-05-16 NOTE — Plan of Care (Signed)
  Problem: Education: Goal: Knowledge of General Education information will improve Description: Including pain rating scale, medication(s)/side effects and non-pharmacologic comfort measures Outcome: Progressing   Problem: Health Behavior/Discharge Planning: Goal: Ability to manage health-related needs will improve Outcome: Progressing   Problem: Clinical Measurements: Goal: Ability to maintain clinical measurements within normal limits will improve Outcome: Progressing Goal: Will remain Boster from infection Outcome: Progressing Goal: Diagnostic test results will improve Outcome: Progressing Goal: Respiratory complications will improve Outcome: Progressing Goal: Cardiovascular complication will be avoided Outcome: Progressing   Problem: Activity: Goal: Risk for activity intolerance will decrease Outcome: Progressing   Problem: Nutrition: Goal: Adequate nutrition will be maintained Outcome: Adequate for Discharge   Problem: Coping: Goal: Level of anxiety will decrease Outcome: Progressing   Problem: Elimination: Goal: Will not experience complications related to bowel motility Outcome: Progressing Goal: Will not experience complications related to urinary retention Outcome: Completed/Met   Problem: Pain Managment: Goal: General experience of comfort will improve and/or be controlled Outcome: Progressing   Problem: Safety: Goal: Ability to remain [place] from injury will improve Outcome: Progressing   Problem: Skin Integrity: Goal: Risk for impaired skin integrity will decrease Outcome: Progressing

## 2023-05-16 NOTE — Discharge Summary (Addendum)
 Physician Discharge Summary  [name] ZOX:[id number] DOB: 10-22-1940 DOA: 05/10/2023  PCP: [name], MD  Admit date: 05/10/2023 Discharge date: 05/16/2023  Admitted From: home Disposition:  home  Recommendations for Outpatient Follow-up:  Follow up with PCP in 1-2 weeks Follow up with Dr Leonides Schanz in 1-2 weeks  Home Health: none Equipment/Devices: none  Discharge Condition: stable CODE STATUS: Full code Diet Orders (From admission, onward)     Start     Ordered   05/11/23 0002  Diet Heart Room service appropriate? Yes; Fluid consistency: Thin  Diet effective now       Question Answer Comment  Room service appropriate? Yes   Fluid consistency: Thin      05/11/23 0002            HPI: Per admitting MD, [name] is a 83 y.o. female with history of AML being followed by Dr. Leonides Schanz at [facility] health and Dr. Lowella Bandy in [facility] recently admitted at [facility] on 04/19/2023 through 04/29/2023 for neutropenic fever with multifocal pneumonia was treated with antibiotics for 10 days has follow-up with oncologist next Monday in 5 days has been progressively getting weaker and today started having subjective feeling of fever chills and was advised by our oncology office to come to the ER.  Patient's sister states that patient had a temperature of 100 F at the house.  Denies any nausea vomiting shortness of breath cough headache dysuria or diarrhea.  Patient has been ambulating with the help of wheelchair since her discharge from [facility] 10 days ago.   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  COVID, influenza, RVP, blood cultures all negative.  She was initially maintained on broad-spectrum antibiotics with clinical improvement, she is now afebrile, feeling back to baseline, will be transitioned to oral antibiotics and discharged home in stable condition   Active problems AML, pancytopenia - She had bone marrow biopsy on 04/19/23; showing ~7% blasts in a ~20% cellular marrow concerning for recurrence of disease. Received one unit PRBC 4/05. Hb, WBC, Platelets all stable.  Cardiomyopathy - associated chemotherapy,  ejection fraction July 20 24--25%.  Has improved since that to 55% hide 2D echo 04/19/2023. Continue home medications on discharge  Hypokalemia - Replaced Bradycardia - Holder parameter for metoprolol added.  Mild hyponatremia - monitor.   Sepsis ruled out   Discharge Instructions   Allergies as of 05/16/2023       Reactions   Ambien [zolpidem]    Intolerant.   Codeine Nausea And Vomiting   Dust Mite Extract Cough   Grass Pollen(k-o-r-t-swt Vern) Cough   Hydrocodone Nausea Only   Statins    Muscle pain   Sulfa Antibiotics Nausea And Vomiting   Zofran [ondansetron]    Inc in anxiety and doesn't help with nausea.          Medication List     TAKE these medications    acyclovir 400 MG tablet Commonly known as: ZOVIRAX Take 400 mg by mouth 2 (two) times daily.   ALPRAZolam 0.25 MG tablet Commonly known as: XANAX TAKE  1 TABLET (0.25 MG TOTAL) BY MOUTH DAILY AS NEEDED.   furosemide 20 MG tablet Commonly known as: LASIX Take 20 mg by mouth daily as needed for fluid. Prn for weight gain >5 lbs   levofloxacin 250 MG tablet Commonly known as: LEVAQUIN Take 250 mg by mouth daily. What changed: Another medication with the same name was added. Make sure you understand how and when to take each.   levofloxacin 750 MG tablet Commonly known as: Levaquin Take 1 tablet (750 mg total)  by mouth every other day for 4 days. What changed: You were already taking a medication with the same name, and this prescription was added. Make sure you understand how and when to take each.   levothyroxine 50 MCG tablet Commonly known as: SYNTHROID TAKE 1 TABLET (50 MCG TOTAL) BY MOUTH DAILY.   losartan 25 MG tablet Commonly known as: COZAAR Take 1 tablet (25 mg total) by mouth daily.   metoprolol succinate 25 MG 24 hr tablet Commonly known as: TOPROL-XL Take 1 tablet by mouth daily.   mirtazapine 15 MG tablet Commonly known as: REMERON Take 15 mg by mouth at bedtime.   polycarbophil 625 MG tablet Commonly known as: FIBERCON Take 625 mg by mouth daily.   posaconazole 100 MG Tbec delayed-release tablet Commonly known as: NOXAFIL Take 300 mg by mouth daily.   prochlorperazine 5 MG tablet Commonly known as: COMPAZINE Take by mouth.   spironolactone 25 MG tablet Commonly known as: ALDACTONE Take by mouth.   VITAMIN B-12 PO Take 1,000 mcg by mouth daily.   zinc sulfate (50mg  elemental zinc) 220 (50 Zn) MG capsule Take 220 mg by mouth daily.       Consultations: Oncology   Procedures/Studies:  CT CHEST W CONTRAST Result Date: 05/11/2023 CLINICAL DATA:  Pneumonia, complication suspected, general unwell feeling for a few days and worsening over the last few hours. History of AML. Ex-smoker. EXAM: CT CHEST WITH CONTRAST TECHNIQUE: Multidetector CT imaging of the chest was performed during intravenous contrast administration. RADIATION DOSE REDUCTION: This exam was performed according to the departmental dose-optimization program which includes automated exposure control, adjustment of the mA and/or kV according to patient size and/or use of iterative reconstruction technique. CONTRAST:  75mL OMNIPAQUE IOHEXOL 300 MG/ML  SOLN COMPARISON:  Chest radiograph 05/10/2023 and CTA chest 08/09/2022 FINDINGS: Cardiovascular: No pericardial effusion. Right chest wall Port-A-Cath tip  the in the low SVC. Normal caliber aorta. Mild aortic atherosclerotic calcification. Mediastinum/Nodes: Trachea and esophagus are unremarkable. 1.1 cm precarinal node on series 2/image 25 is favored reactive. No additional enlarged lymph nodes. Lungs/Pleura: Patchy consolidative and airspace opacities in the bilateral upper and lower lobes suspicious for multifocal pneumonia. No pleural effusion or pneumothorax. Upper Abdomen: No acute abnormality. Musculoskeletal: No acute fracture. IMPRESSION: 1. Multifocal pneumonia. Follow-up in 6-8 weeks after treatment is recommended to ensure resolution. 2. Aortic Atherosclerosis (ICD10-I70.0). Electronically Signed   By: Minerva Fester M.D.   On: 05/11/2023 02:10   DG Chest 2 View Result Date: 05/10/2023 CLINICAL DATA:  Chemo patient, fever EXAM: CHEST - 2 VIEW COMPARISON:  05/07/2023 FINDINGS: Right chest wall Port-A-Cath tip at the superior cavoatrial junction. Stable cardiomediastinal silhouette. Patchy airspace opacities in the right upper lobe suspicious for pneumonia. No pleural effusion or pneumothorax. No displaced rib fractures. IMPRESSION: Right upper lobe pneumonia. Follow-up in 6-8 weeks is recommended to ensure resolution. Electronically Signed   By: Minerva Fester M.D.   On: 05/10/2023 21:03   DG Chest Portable 1  View Result Date: 05/07/2023 CLINICAL DATA:  ALTERED MENTAL STATUS. EXAM: PORTABLE CHEST 1 VIEW COMPARISON:  05/17/2013. FINDINGS: Bilateral lung fields are clear. Bilateral costophrenic angles are clear. Normal cardio-mediastinal silhouette. No acute osseous abnormalities. The soft tissues are within normal limits. Right-sided CT Port-A-Cath is seen with its tip overlying the cavoatrial junction region. IMPRESSION: No active disease. Electronically Signed   By: Jules Schick M.D.   On: 05/07/2023 11:01     Subjective: - no chest pain, shortness of breath, no abdominal pain, nausea or vomiting.   Discharge Exam: BP 139/78 (BP Location:  Right Arm)   Pulse (!) 57   Temp 98.5 F (36.9 C) (Oral)   Resp 18   Ht 5\' 4"  (1.626 m)   Wt 50.8 kg   LMP 01/19/1992 (Exact Date)   SpO2 100%   BMI 19.22 kg/m   General: Pt is alert, awake, not in acute distress Cardiovascular: RRR, S1/S2 +, no rubs, no gallops Respiratory: CTA bilaterally, no wheezing, no rhonchi Abdominal: Soft, NT, ND, bowel sounds + Extremities: no edema, no cyanosis    The results of significant diagnostics from this hospitalization (including imaging, microbiology, ancillary and laboratory) are listed below for reference.     Microbiology: Recent Results (from the past 240 hours)  Culture, blood (routine x 2)     Status: None   Collection Time: 05/07/23  9:26 AM   Specimen: BLOOD  Result Value Ref Range Status   Specimen Description   Final    BLOOD SITE NOT SPECIFIED Performed at [facility], [address], [place], [place] [address]    Special Requests   Final    BOTTLES DRAWN AEROBIC AND ANAEROBIC Blood Culture results may not be optimal due to an inadequate volume of blood received in culture bottles Performed at [facility], [address] [place], [place] [address]    Culture   Final    NO GROWTH 5 DAYS Performed at [facility], [address], [place], [place] [address]    Report Status 05/12/2023 FINAL  Final  Resp panel by RT-PCR (RSV, Flu A&B, Covid) Anterior Nasal Swab     Status: None   Collection Time: 05/07/23  9:27 AM   Specimen: Anterior Nasal Swab  Result Value Ref Range Status   SARS Coronavirus 2 by RT PCR NEGATIVE NEGATIVE Final    Comment: (NOTE) SARS-CoV-2 target nucleic acids are NOT DETECTED.  The SARS-CoV-2 RNA is generally detectable in upper respiratory specimens during the acute phase of infection. The lowest concentration of SARS-CoV-2 viral copies this assay can detect is 138 copies/mL. A negative result does not preclude SARS-Cov-2 infection and should not be  used as the sole basis for treatment or other patient management decisions. A negative result may occur with  improper specimen collection/handling, submission of specimen other than nasopharyngeal swab, presence of viral mutation(s) within the areas targeted by this assay, and inadequate number of viral copies(<138 copies/mL). A negative result must be combined with clinical observations, patient history, and epidemiological information. The expected result is Negative.  Fact Sheet for Patients:  BloggerCourse.com  Fact Sheet for Healthcare Providers:  SeriousBroker.it  This test is no t yet approved or cleared by the [place] FDA and  has been authorized for detection and/or diagnosis of SARS-CoV-2 by FDA under an Emergency Use Authorization (EUA). This EUA will remain  in effect (meaning this test can be used) for the duration of the COVID-19 declaration under  Section 564(b)(1) of the Act, 21 U.S.C.section 360bbb-3(b)(1), unless the authorization is terminated  or revoked sooner.       Influenza A by PCR NEGATIVE NEGATIVE Final   Influenza B by PCR NEGATIVE NEGATIVE Final    Comment: (NOTE) The Xpert Xpress SARS-CoV-2/FLU/RSV plus assay is intended as an aid in the diagnosis of influenza from Nasopharyngeal swab specimens and should not be used as a sole basis for treatment. Nasal washings and aspirates are unacceptable for Xpert Xpress SARS-CoV-2/FLU/RSV testing.  Fact Sheet for Patients: BloggerCourse.com  Fact Sheet for Healthcare Providers: SeriousBroker.it  This test is not yet approved or cleared by the [place] FDA and has been authorized for detection and/or diagnosis of SARS-CoV-2 by FDA under an Emergency Use Authorization (EUA). This EUA will remain in effect (meaning this test can be used) for the duration of the COVID-19 declaration under Section  564(b)(1) of the Act, 21 U.S.C. section 360bbb-3(b)(1), unless the authorization is terminated or revoked.     Resp Syncytial Virus by PCR NEGATIVE NEGATIVE Final    Comment: (NOTE) Fact Sheet for Patients: BloggerCourse.com  Fact Sheet for Healthcare Providers: SeriousBroker.it  This test is not yet approved or cleared by the [place] FDA and has been authorized for detection and/or diagnosis of SARS-CoV-2 by FDA under an Emergency Use Authorization (EUA). This EUA will remain in effect (meaning this test can be used) for the duration of the COVID-19 declaration under Section 564(b)(1) of the Act, 21 U.S.C. section 360bbb-3(b)(1), unless the authorization is terminated or revoked.  Performed at [facility], [address] [place], [place] [address]   Resp panel by RT-PCR (RSV, Flu A&B, Covid) Anterior Nasal Swab     Status: None   Collection Time: 05/10/23  9:04 PM   Specimen: Anterior Nasal Swab  Result Value Ref Range Status   SARS Coronavirus 2 by RT PCR NEGATIVE NEGATIVE Final    Comment: (NOTE) SARS-CoV-2 target nucleic acids are NOT DETECTED.  The SARS-CoV-2 RNA is generally detectable in upper respiratory specimens during the acute phase of infection. The lowest concentration of SARS-CoV-2 viral copies this assay can detect is 138 copies/mL. A negative result does not preclude SARS-Cov-2 infection and should not be used as the sole basis for treatment or other patient management decisions. A negative result may occur with  improper specimen collection/handling, submission of specimen other than nasopharyngeal swab, presence of viral mutation(s) within the areas targeted by this assay, and inadequate number of viral copies(<138 copies/mL). A negative result must be combined with clinical observations, patient history, and epidemiological information. The expected result is Negative.  Fact  Sheet for Patients:  BloggerCourse.com  Fact Sheet for Healthcare Providers:  SeriousBroker.it  This test is no t yet approved or cleared by the [place] FDA and  has been authorized for detection and/or diagnosis of SARS-CoV-2 by FDA under an Emergency Use Authorization (EUA). This EUA will remain  in effect (meaning this test can be used) for the duration of the COVID-19 declaration under Section 564(b)(1) of the Act, 21 U.S.C.section 360bbb-3(b)(1), unless the authorization is terminated  or revoked sooner.       Influenza A by PCR NEGATIVE NEGATIVE Final   Influenza B by PCR NEGATIVE NEGATIVE Final    Comment: (NOTE) The Xpert Xpress SARS-CoV-2/FLU/RSV plus assay is intended as an aid in the diagnosis of influenza from Nasopharyngeal swab specimens and should not be used as a sole basis for treatment. Nasal washings and aspirates  are unacceptable for Xpert Xpress SARS-CoV-2/FLU/RSV testing.  Fact Sheet for Patients: BloggerCourse.com  Fact Sheet for Healthcare Providers: SeriousBroker.it  This test is not yet approved or cleared by the [place] FDA and has been authorized for detection and/or diagnosis of SARS-CoV-2 by FDA under an Emergency Use Authorization (EUA). This EUA will remain in effect (meaning this test can be used) for the duration of the COVID-19 declaration under Section 564(b)(1) of the Act, 21 U.S.C. section 360bbb-3(b)(1), unless the authorization is terminated or revoked.     Resp Syncytial Virus by PCR NEGATIVE NEGATIVE Final    Comment: (NOTE) Fact Sheet for Patients: BloggerCourse.com  Fact Sheet for Healthcare Providers: SeriousBroker.it  This test is not yet approved or cleared by the [place] FDA and has been authorized for detection and/or diagnosis of SARS-CoV-2 by FDA under an  Emergency Use Authorization (EUA). This EUA will remain in effect (meaning this test can be used) for the duration of the COVID-19 declaration under Section 564(b)(1) of the Act, 21 U.S.C. section 360bbb-3(b)(1), unless the authorization is terminated or revoked.  Performed at [facility], [address], [place], [place] [address]   Blood culture (routine x 2)     Status: None   Collection Time: 05/10/23  9:49 PM   Specimen: BLOOD RIGHT FOREARM  Result Value Ref Range Status   Specimen Description   Final    BLOOD RIGHT FOREARM Performed at [facility], [address] [place], [place] [address]    Special Requests   Final    BOTTLES DRAWN AEROBIC AND ANAEROBIC Blood Culture adequate volume Performed at [facility], [address], [place], [place] [address]    Culture   Final    NO GROWTH 5 DAYS Performed at [facility], [address], [place], [place] [address]    Report Status 05/15/2023 FINAL  Final  Blood culture (routine x 2)     Status: None   Collection Time: 05/10/23  9:51 PM   Specimen: BLOOD LEFT HAND  Result Value Ref Range Status   Specimen Description   Final    BLOOD LEFT HAND Performed at [facility], [address] [place], [place] [address]    Special Requests   Final    BOTTLES DRAWN AEROBIC AND ANAEROBIC Blood Culture results may not be optimal due to an inadequate volume of blood received in culture bottles Performed at [facility], [address] [place], [place] [address]    Culture   Final    NO GROWTH 5 DAYS Performed at [facility], [address] [place], [place] [address]    Report Status 05/15/2023 FINAL  Final  Respiratory (~20 pathogens) panel by PCR     Status: None   Collection Time: 05/11/23 11:25 AM   Specimen: Nasopharyngeal Swab; Respiratory  Result Value Ref Range Status   Adenovirus NOT DETECTED NOT DETECTED Final   Coronavirus 229E NOT  DETECTED NOT DETECTED Final    Comment: (NOTE) The Coronavirus on the Respiratory Panel, DOES NOT test for the novel  Coronavirus (2019 nCoV)    Coronavirus HKU1 NOT DETECTED NOT DETECTED Final   Coronavirus NL63 NOT DETECTED NOT DETECTED Final   Coronavirus OC43 NOT DETECTED NOT DETECTED Final   Metapneumovirus NOT DETECTED NOT DETECTED Final   Rhinovirus / Enterovirus NOT DETECTED NOT DETECTED Final   Influenza A NOT DETECTED NOT DETECTED Final   Influenza B NOT  DETECTED NOT DETECTED Final   Parainfluenza Virus 1 NOT DETECTED NOT DETECTED Final   Parainfluenza Virus 2 NOT DETECTED NOT DETECTED Final   Parainfluenza Virus 3 NOT DETECTED NOT DETECTED Final   Parainfluenza Virus 4 NOT DETECTED NOT DETECTED Final   Respiratory Syncytial Virus NOT DETECTED NOT DETECTED Final   Bordetella pertussis NOT DETECTED NOT DETECTED Final   Bordetella Parapertussis NOT DETECTED NOT DETECTED Final   Chlamydophila pneumoniae NOT DETECTED NOT DETECTED Final   Mycoplasma pneumoniae NOT DETECTED NOT DETECTED Final    Comment: Performed at [facility], [address] [place], [place] [address]     Labs: Basic Metabolic Panel: Recent Labs  Lab 05/10/23 9629 05/10/23 2014 05/12/23 0900 05/13/23 1107 05/14/23 0408 05/15/23 0839 05/16/23 1057  NA 138   < > 136 133* 135 134* 134*  K 3.8   < > 3.6 3.2* 3.9 3.1* 3.3*  CL 101   < > 105 100 103 103 105  CO2 27   < > 24 22 22 22  21*  GLUCOSE 121*   < > 130* 115* 106* 142* 140*  BUN 14   < > 16 19 19 20 18   CREATININE 0.78   < > 0.75 0.59 0.64 0.62 0.59  CALCIUM 9.1   < > 8.7* 8.4* 8.6* 8.2* 8.3*  MG 1.7  --   --   --   --   --   --    < > = values in this interval not displayed.   Liver Function Tests: Recent Labs  Lab 05/10/23 0743 05/10/23 2014 05/11/23 0417 05/16/23 1057  AST 30 34 25 37  ALT 39 39 33 33  ALKPHOS 140* 121 98 114  BILITOT 1.3* 1.3* 1.4* 1.1  PROT 7.0 6.6 6.0* 6.0*  ALBUMIN 3.3* 2.6* 2.3* 2.2*   CBC: Recent  Labs  Lab 05/12/23 0900 05/12/23 2016 05/13/23 1107 05/14/23 0408 05/15/23 0839 05/16/23 1057  WBC 1.0*  --  1.2* 1.3* 0.9* 1.0*  NEUTROABS 0.1*  --  0.2* 0.2* 0.2* 0.2*  HGB 7.4* 9.3* 8.7* 9.0* 8.2* 8.2*  HCT 24.3* 28.5* 26.3* 27.0* 26.4* 26.7*  MCV 98.0  --  90.4 90.9 93.6 94.0  PLT 82*  --  99* 116* 111* 122*   CBG: No results for input(s): "GLUCAP" in the last 168 hours. Hgb A1c No results for input(s): "HGBA1C" in the last 72 hours. Lipid Profile No results for input(s): "CHOL", "HDL", "LDLCALC", "TRIG", "CHOLHDL", "LDLDIRECT" in the last 72 hours. Thyroid function studies No results for input(s): "TSH", "T4TOTAL", "T3FREE", "THYROIDAB" in the last 72 hours.  Invalid input(s): "FREET3" Urinalysis    Component Value Date/Time   COLORURINE YELLOW 05/10/2023 2328   APPEARANCEUR CLEAR 05/10/2023 2328   LABSPEC 1.005 05/10/2023 2328   PHURINE 6.0 05/10/2023 2328   GLUCOSEU NEGATIVE 05/10/2023 2328   GLUCOSEU NEGATIVE 08/08/2022 1618   HGBUR NEGATIVE 05/10/2023 2328   BILIRUBINUR NEGATIVE 05/10/2023 2328   KETONESUR NEGATIVE 05/10/2023 2328   PROTEINUR NEGATIVE 05/10/2023 2328   UROBILINOGEN 1.0 08/08/2022 1618   NITRITE NEGATIVE 05/10/2023 2328   LEUKOCYTESUR NEGATIVE 05/10/2023 2328    FURTHER DISCHARGE INSTRUCTIONS:   Get Medicines reviewed and adjusted: Please take all your medications with you for your next visit with your Primary MD   Laboratory/radiological data: Please request your Primary MD to go over all hospital tests and procedure/radiological results at the follow up, please ask your Primary MD to get all Hospital records sent to his/her  office.   In some cases, they will be blood work, cultures and biopsy results pending at the time of your discharge. Please request that your primary care M.D. goes through all the records of your hospital data and follows up on these results.   Also Note the following: If you experience worsening of your admission  symptoms, develop shortness of breath, life threatening emergency, suicidal or homicidal thoughts you must seek medical attention immediately by calling 911 or calling your MD immediately  if symptoms less severe.   You must read complete instructions/literature along with all the possible adverse reactions/side effects for all the Medicines you take and that have been prescribed to you. Take any new Medicines after you have completely understood and accpet all the possible adverse reactions/side effects.    Do not drive when taking Pain medications or sleeping medications (Benzodaizepines)   Do not take more than prescribed Pain, Sleep and Anxiety Medications. It is not advisable to combine anxiety,sleep and pain medications without talking with your primary care practitioner   Special Instructions: If you have smoked or chewed Tobacco  in the last 2 yrs please stop smoking, stop any regular Alcohol  and or any Recreational drug use.   Wear Seat belts while driving.   Please note: You were cared for by a hospitalist during your hospital stay. Once you are discharged, your primary care physician will handle any further medical issues. Please note that NO REFILLS for any discharge medications will be authorized once you are discharged, as it is imperative that you return to your primary care physician (or establish a relationship with a primary care physician if you do not have one) for your post hospital discharge needs so that they can reassess your need for medications and monitor your lab values.  Time coordinating discharge: 40 minutes  SIGNED:  Pamella Pert, MD, PhD 05/16/2023, 3:59 PM

## 2023-05-16 NOTE — Progress Notes (Signed)
 Mobility Specialist - Progress Note   05/16/23 1038  Mobility  Activity Ambulated independently in hallway  Level of Assistance Independent  Assistive Device None  Distance Ambulated (ft) 350 ft  Activity Response Tolerated well  Mobility Referral Yes  Mobility visit 1 Mobility  Mobility Specialist Start Time (ACUTE ONLY) 1027  Mobility Specialist Stop Time (ACUTE ONLY) 1034  Mobility Specialist Time Calculation (min) (ACUTE ONLY) 7 min   Pt received in bed and agreeable to mobility. No complaints during session. Pt to bed after session with all needs met.    [facility]

## 2023-05-16 NOTE — Telephone Encounter (Signed)
 Received call from pt's sister, [name]. She states Gaynell is home now from the hospital. They are asking if she needs to come in tomorrow for her regaularly scheduled lab appt. Advised that she had labs done today and HGB was 8.2 Advised that normally we would give her blood for that. Pt states she will be going to [facility] -Dr. Lowella Bandy on 05/21/23. She prefers to wait until that appt for her next labs.  She is aware of her appt on next Thursday, 05/24/23

## 2023-05-16 NOTE — Progress Notes (Signed)
 Discharge medication delivered to patient and family at bedside. Discharged patient to front of hospital to family. Sharrell Ku RN

## 2023-05-16 NOTE — Progress Notes (Signed)
 Discharge package printed and instructions given to pt and POA. Verbalize understanding.

## 2023-05-17 ENCOUNTER — Telehealth: Payer: Self-pay

## 2023-05-17 ENCOUNTER — Inpatient Hospital Stay

## 2023-05-17 NOTE — Transitions of Care (Post Inpatient/ED Visit) (Signed)
 05/17/2023  Name: [name] MRN: [id number] DOB: 1940/10/02  Today's TOC FU Call Status: Today's TOC FU Call Status:: Successful TOC FU Call Completed TOC FU Call Complete Date: 05/17/23 Patient's Name and Date of Birth confirmed.  Transition Care Management Follow-up Telephone Call Date of Discharge: 05/16/23 Discharge Facility: [facility]) Type of Discharge: Inpatient Admission Primary Inpatient Discharge Diagnosis:: Pneumonia How have you been since you were released from the hospital?: Same Any questions or concerns?: No  Items Reviewed: Did you receive and understand the discharge instructions provided?: Yes Medications obtained,verified, and reconciled?: Yes (Medications Reviewed) Any new allergies since your discharge?: No Dietary orders reviewed?: NA Do you have support at home?: Yes People in Home [RPT]: sibling(s) Name of Support/Comfort Primary Source: Ann Maki  Medications Reviewed Today: Medications Reviewed Today     Reviewed by Redge Gainer, RN (Case Manager) on 05/17/23 at 1128  Med List Status: <None>   Medication Order Taking? Sig Documenting Provider Last Dose Status Informant  acyclovir (ZOVIRAX) 400 MG tablet [id number]  Take 400 mg by mouth 2 (two) times daily. [provider]  Active Self, Pharmacy Records  ALPRAZolam Prudy Feeler) 0.25 MG tablet [id number]  TAKE 1 TABLET (0.25 MG TOTAL) BY MOUTH DAILY AS NEEDED. [name], MD  Active Self, Pharmacy Records  Cyanocobalamin (VITAMIN B-12 PO) [id number]  Take 1,000 mcg by mouth daily. [provider]  Active Self, Pharmacy Records  furosemide (LASIX) 20 MG tablet [id number]  Take 20 mg by mouth daily as needed for fluid. Prn for weight gain >5 lbs  Patient not taking: Reported on 05/10/2023   [provider]  Active Self, Pharmacy Records  levofloxacin (LEVAQUIN) 250 MG tablet [id number]  Take 250 mg by mouth daily. [provider]  Consider Medication  Status and Discontinue (No longer needed (for PRN medications)) Self, Pharmacy Records  levofloxacin (LEVAQUIN) 750 MG tablet [id number]  Take 1 tablet (750 mg total) by mouth every other day for 4 days. Leatha Gilding, MD  Active   levothyroxine (SYNTHROID) 50 MCG tablet [id number]  TAKE 1 TABLET (50 MCG TOTAL) BY MOUTH DAILY. [name], MD  Active Self, Pharmacy Records  losartan (COZAAR) 25 MG tablet [id number]  Take 1 tablet (25 mg total) by mouth daily. Jodelle Red, MD  Active Self, Pharmacy Records  metoprolol succinate (TOPROL-XL) 25 MG 24 hr tablet [id number]  Take 1 tablet by mouth daily. [provider]  Active Self, Pharmacy Records  mirtazapine (REMERON) 15 MG tablet [id number]  Take 15 mg by mouth at bedtime. [provider]  Active Self, Pharmacy Records           Med Note [facility], Laurie Lovejoy   Thu May 17, 2023 11:13 AM)    polycarbophil (FIBERCON) 625 MG tablet [id number]  Take 625 mg by mouth daily. [provider]  Active Self, Pharmacy Records  posaconazole (NOXAFIL) 100 MG TBEC delayed-release tablet [id number]  Take 300 mg by mouth daily. [provider]  Active Self, Pharmacy Records  prochlorperazine (COMPAZINE) 5 MG tablet [id number]  Take by mouth. [provider]  Active Self, Pharmacy Records           Med Note Sedonia Small May 07, 2023 12:12 PM)    spironolactone (ALDACTONE) 25 MG tablet [id number]  Take by mouth. [provider]  Active Self, Pharmacy Records  zinc sulfate 220 (50 Zn) MG capsule [id number]  Take 220 mg by mouth daily. [provider]  Active Self, Pharmacy Records            Home Care and Equipment/Supplies: Were Home Health Services Ordered?: No Any new equipment or medical supplies ordered?: No  Functional Questionnaire: Do you need assistance with bathing/showering or dressing?: No Do you need assistance with meal preparation?: Yes (Her sister provides  assistance) Do you need assistance with eating?: No Do you have difficulty maintaining continence: No Do you need assistance with getting out of bed/getting out of a chair/moving?: Yes (occasionally) Do you have difficulty managing or taking your medications?: No  Follow up appointments reviewed: PCP Follow-up appointment confirmed?: NA Specialist Hospital Follow-up appointment confirmed?: Yes Date of Specialist follow-up appointment?: 05/21/23 Follow-Up Specialty Provider:: Dr. Lowella Bandy Do you need transportation to your follow-up appointment?: No Do you understand care options if your condition(s) worsen?: Yes-patient verbalized understanding  SDOH Interventions Today    Flowsheet Row Most Recent Value  SDOH Interventions   Food Insecurity Interventions Intervention Not Indicated  Housing Interventions Intervention Not Indicated  Transportation Interventions Intervention Not Indicated  Utilities Interventions Intervention Not Indicated      The patient was discharged from [facility] due to Pneumonia. She has a history of AML. She was present on speaker phone with her sister who did most of the talking. The patient is very fatigued but plans to continue with treatment at [facility]. Her current weight in the chart is listed as 111lbs but her sister feels like she has lost weight since then. The patient declines follow up appointment with the PCP and the [facility] 30 Day outreach program.  Deidre Ala, BSN, RN [place]  [facility] - [facility] RN Care Manager [phone]

## 2023-05-21 ENCOUNTER — Inpatient Hospital Stay

## 2023-05-21 DIAGNOSIS — C9200 Acute myeloblastic leukemia, not having achieved remission: Secondary | ICD-10-CM | POA: Diagnosis not present

## 2023-05-23 DIAGNOSIS — H16223 Keratoconjunctivitis sicca, not specified as Sjogren's, bilateral: Secondary | ICD-10-CM | POA: Diagnosis not present

## 2023-05-23 DIAGNOSIS — Z961 Presence of intraocular lens: Secondary | ICD-10-CM | POA: Diagnosis not present

## 2023-05-23 DIAGNOSIS — H15101 Unspecified episcleritis, right eye: Secondary | ICD-10-CM | POA: Diagnosis not present

## 2023-05-24 ENCOUNTER — Inpatient Hospital Stay

## 2023-05-24 ENCOUNTER — Other Ambulatory Visit: Payer: Self-pay | Admitting: *Deleted

## 2023-05-24 DIAGNOSIS — C9200 Acute myeloblastic leukemia, not having achieved remission: Secondary | ICD-10-CM | POA: Diagnosis not present

## 2023-05-24 LAB — CBC WITH DIFFERENTIAL (CANCER CENTER ONLY)
Abs Immature Granulocytes: 0.02 10*3/uL (ref 0.00–0.07)
Basophils Absolute: 0 10*3/uL (ref 0.0–0.1)
Basophils Relative: 0 %
Eosinophils Absolute: 0 10*3/uL (ref 0.0–0.5)
Eosinophils Relative: 0 %
HCT: 25.4 % — ABNORMAL LOW (ref 36.0–46.0)
Hemoglobin: 8.1 g/dL — ABNORMAL LOW (ref 12.0–15.0)
Immature Granulocytes: 2 %
Lymphocytes Relative: 74 %
Lymphs Abs: 0.6 10*3/uL — ABNORMAL LOW (ref 0.7–4.0)
MCH: 29.5 pg (ref 26.0–34.0)
MCHC: 31.9 g/dL (ref 30.0–36.0)
MCV: 92.4 fL (ref 80.0–100.0)
Monocytes Absolute: 0.1 10*3/uL (ref 0.1–1.0)
Monocytes Relative: 14 %
Neutro Abs: 0.1 10*3/uL — CL (ref 1.7–7.7)
Neutrophils Relative %: 10 %
Platelet Count: 158 10*3/uL (ref 150–400)
RBC: 2.75 MIL/uL — ABNORMAL LOW (ref 3.87–5.11)
RDW: 19.7 % — ABNORMAL HIGH (ref 11.5–15.5)
WBC Count: 0.9 10*3/uL — CL (ref 4.0–10.5)
WBC Morphology: 2
nRBC: 2.3 % — ABNORMAL HIGH (ref 0.0–0.2)

## 2023-05-24 LAB — CMP (CANCER CENTER ONLY)
ALT: 70 U/L — ABNORMAL HIGH (ref 0–44)
AST: 58 U/L — ABNORMAL HIGH (ref 15–41)
Albumin: 3.3 g/dL — ABNORMAL LOW (ref 3.5–5.0)
Alkaline Phosphatase: 165 U/L — ABNORMAL HIGH (ref 38–126)
Anion gap: 7 (ref 5–15)
BUN: 17 mg/dL (ref 8–23)
CO2: 29 mmol/L (ref 22–32)
Calcium: 8.9 mg/dL (ref 8.9–10.3)
Chloride: 105 mmol/L (ref 98–111)
Creatinine: 0.69 mg/dL (ref 0.44–1.00)
GFR, Estimated: >60 mL/min (ref >60)
Glucose, Bld: 110 mg/dL — ABNORMAL HIGH (ref 70–99)
Potassium: 3.5 mmol/L (ref 3.5–5.1)
Sodium: 141 mmol/L (ref 135–145)
Total Bilirubin: 1 mg/dL (ref 0.0–1.2)
Total Protein: 6.6 g/dL (ref 6.5–8.1)

## 2023-05-24 LAB — MAGNESIUM: Magnesium: 1.6 mg/dL — ABNORMAL LOW (ref 1.7–2.4)

## 2023-05-24 LAB — SAMPLE TO BLOOD BANK

## 2023-05-24 LAB — PREPARE RBC (CROSSMATCH)

## 2023-05-24 MED ORDER — ACETAMINOPHEN 325 MG PO TABS
650.0000 mg | ORAL_TABLET | Freq: Once | ORAL | Status: AC
  Administered 2023-05-24: 650 mg via ORAL
  Filled 2023-05-24: qty 2

## 2023-05-24 MED ORDER — SODIUM CHLORIDE 0.9% IV SOLUTION
250.0000 mL | INTRAVENOUS | Status: DC
  Administered 2023-05-24: 250 mL via INTRAVENOUS

## 2023-05-24 MED ORDER — SODIUM CHLORIDE 0.9% FLUSH
10.0000 mL | INTRAVENOUS | Status: AC | PRN
  Administered 2023-05-24: 10 mL

## 2023-05-24 MED ORDER — HEPARIN SOD (PORK) LOCK FLUSH 100 UNIT/ML IV SOLN: 500.0000 [IU] | Freq: Once | INTRAVENOUS | Status: DC

## 2023-05-24 MED ORDER — SODIUM CHLORIDE 0.9% FLUSH
10.0000 mL | INTRAVENOUS | Status: DC | PRN
  Administered 2023-05-24: 10 mL via INTRAVENOUS

## 2023-05-24 MED ORDER — HEPARIN SOD (PORK) LOCK FLUSH 100 UNIT/ML IV SOLN
500.0000 [IU] | Freq: Every day | INTRAVENOUS | Status: AC | PRN
  Administered 2023-05-24: 500 [IU]

## 2023-05-24 NOTE — Patient Instructions (Signed)
 Blood Transfusion, Adult A blood transfusion is a procedure in which you receive blood or a type of blood cell (blood component) through an IV. You may need a blood transfusion when you have a low blood count, which is a low number of any blood cell. This may result from a bleeding disorder, illness, injury, or surgery. The blood may come from a donor, or you may be able to have your own blood collected and stored (autologous blood donation) before a planned surgery. The blood given in a transfusion may be made up of different blood components. You may receive: Red blood cells. These carry oxygen to the cells in the body. Platelets. These help your blood to clot. Plasma. This is the liquid part of your blood. It carries proteins and other substances throughout the body. White blood cells. These help you fight infections. If you have hemophilia or another clotting disorder, you may also receive other types of blood products. Depending on the type of blood product, this procedure may take 1-4 hours to complete. Tell a health care provider about: Any bleeding problems you have. Any previous reactions you have had during a blood transfusion. Any allergies you have. All medicines you are taking, including vitamins, herbs, eye drops, creams, and over-the-counter medicines. Any surgeries you have had. Any medical conditions you have. Whether you are pregnant or may be pregnant. What are the risks? Talk with your health care provider about risks. The most common problems include: A mild allergic reaction, such as red, swollen areas of skin (hives) and itching. Fever or chills. This may be the body's response to new blood cells received. This may occur during or up to 4 hours after the transfusion. More serious problems may include: A serious allergic reaction that causes difficulty breathing or swelling around the face and lips. Transfusion-associated circulatory overload (TACO), or too much fluid in  the lungs. This may cause breathing problems. Transfusion-related acute lung injury (TRALI), which causes breathing difficulty and low oxygen in the blood. This can occur within hours of the transfusion or several days later. Iron overload. This can happen after receiving many blood transfusions over a period of time. Infection or virus being transmitted. This is rare because donated blood is carefully tested before it is given. Hemolytic transfusion reaction. This is rare. It happens when the body's defense system (immune system)tries to attack the new blood cells. Symptoms may include fever, chills, nausea, low blood pressure, and low back or chest pain. Transfusion-associated graft-versus-host disease (TAGVHD). This is rare. It happens when donated cells attack the body's healthy tissues. What happens before the procedure? You will have a blood test to check your blood type. This test is done to know what kind of blood your body will accept and to match it to the donor blood. If you are going to have a planned surgery, you may be able to do an autologous blood donation. This may be done in case you need to have a transfusion. You will have your temperature, blood pressure, and pulse checked before the transfusion. If you have had an allergic reaction to a transfusion in the past, you may be given medicine to help prevent a reaction. This medicine may be given to you by mouth (orally) or through an IV. What happens during the procedure?  An IV will be inserted into one of your veins. The bag of blood will be attached to your IV. The blood will then enter through your vein. Your temperature, blood pressure,  and pulse will be monitored during the transfusion. This monitoring is done to detect early signs of a transfusion reaction. Tell your nurse right away if you have any of these symptoms during the transfusion: Shortness of breath or trouble breathing. Chest or back pain. Fever or  chills. Itching or hives. If you have any signs or symptoms of a reaction, your transfusion will be stopped and you may be given medicine. When the transfusion is complete, your IV will be removed. Pressure may be applied to the IV site for a few minutes. A bandage (dressing)will be applied. The procedure may vary among health care providers and hospitals. What happens after the procedure? Your temperature, blood pressure, pulse, breathing rate, and blood oxygen level will be monitored until you leave the hospital or clinic. Your blood may be tested to see how you have responded to the transfusion. You may be warmed with fluids or blankets to maintain a normal body temperature. If you receive your blood transfusion in an outpatient setting, you will be told whom to contact to report any reactions. Where to find more information Visit the [employer]: redcross.org Summary A blood transfusion is a procedure in which you receive blood or a type of blood cell (blood component) through an IV. The blood given in a transfusion may be made up of different blood components. You may receive red blood cells, platelets, plasma, or white blood cells depending on the condition treated. Your temperature, blood pressure, and pulse will be monitored before, during, and after the transfusion. After the transfusion, your blood may be tested to see how your body has responded. This information is not intended to replace advice given to you by your health care provider. Make sure you discuss any questions you have with your health care provider. Document Revised: 04/22/2021 Document Reviewed: 04/22/2021 Elsevier Patient Education  2024 [employer].

## 2023-05-24 NOTE — Progress Notes (Signed)
 CRITICAL VALUE STICKER  CRITICAL VALUE: WBC 0.9, ANC 0.1  RECEIVER (on-site recipient of call): [facility]   DATE & TIME NOTIFIED: 05/24/2023 @ 825  MESSENGER (representative from lab): Amber  MD NOTIFIED: Dr. Arlena Belts   TIME OF NOTIFICATION: 0830

## 2023-05-25 LAB — TYPE AND SCREEN
ABO/RH(D): A POS
Antibody Screen: NEGATIVE
Unit division: 0

## 2023-05-25 LAB — BPAM RBC
Blood Product Expiration Date: 202505012359
ISSUE DATE / TIME: 202504171108
Unit Type and Rh: 6200

## 2023-05-28 ENCOUNTER — Inpatient Hospital Stay

## 2023-05-28 ENCOUNTER — Other Ambulatory Visit: Payer: Self-pay | Admitting: *Deleted

## 2023-05-28 DIAGNOSIS — C9200 Acute myeloblastic leukemia, not having achieved remission: Secondary | ICD-10-CM | POA: Diagnosis not present

## 2023-05-28 LAB — CMP (CANCER CENTER ONLY)
ALT: 44 U/L (ref 0–44)
AST: 45 U/L — ABNORMAL HIGH (ref 15–41)
Albumin: 3.3 g/dL — ABNORMAL LOW (ref 3.5–5.0)
Alkaline Phosphatase: 305 U/L — ABNORMAL HIGH (ref 38–126)
Anion gap: 12 (ref 5–15)
BUN: 26 mg/dL — ABNORMAL HIGH (ref 8–23)
CO2: 26 mmol/L (ref 22–32)
Calcium: 8.8 mg/dL — ABNORMAL LOW (ref 8.9–10.3)
Chloride: 99 mmol/L (ref 98–111)
Creatinine: 1.1 mg/dL — ABNORMAL HIGH (ref 0.44–1.00)
GFR, Estimated: 51 mL/min — ABNORMAL LOW (ref >60)
Glucose, Bld: 144 mg/dL — ABNORMAL HIGH (ref 70–99)
Potassium: 2.9 mmol/L — ABNORMAL LOW (ref 3.5–5.1)
Sodium: 137 mmol/L (ref 135–145)
Total Bilirubin: 1.5 mg/dL — ABNORMAL HIGH (ref 0.0–1.2)
Total Protein: 6.8 g/dL (ref 6.5–8.1)

## 2023-05-28 LAB — CBC WITH DIFFERENTIAL (CANCER CENTER ONLY)
Abs Immature Granulocytes: 0 10*3/uL (ref 0.00–0.07)
Band Neutrophils: 1 %
Basophils Absolute: 0 10*3/uL (ref 0.0–0.1)
Basophils Relative: 0 %
Blasts: 3 %
Eosinophils Absolute: 0 10*3/uL (ref 0.0–0.5)
Eosinophils Relative: 0 %
HCT: 27.4 % — ABNORMAL LOW (ref 36.0–46.0)
Hemoglobin: 8.9 g/dL — ABNORMAL LOW (ref 12.0–15.0)
Lymphocytes Relative: 58 %
Lymphs Abs: 0.6 10*3/uL — ABNORMAL LOW (ref 0.7–4.0)
MCH: 29.7 pg (ref 26.0–34.0)
MCHC: 32.5 g/dL (ref 30.0–36.0)
MCV: 91.3 fL (ref 80.0–100.0)
Metamyelocytes Relative: 1 %
Monocytes Absolute: 0.2 10*3/uL (ref 0.1–1.0)
Monocytes Relative: 20 %
Myelocytes: 1 %
Neutro Abs: 0.2 10*3/uL — CL (ref 1.7–7.7)
Neutrophils Relative %: 16 %
Platelet Count: 96 10*3/uL — ABNORMAL LOW (ref 150–400)
RBC: 3 MIL/uL — ABNORMAL LOW (ref 3.87–5.11)
RDW: 18.7 % — ABNORMAL HIGH (ref 11.5–15.5)
Smear Review: DECREASED
WBC Count: 1 10*3/uL — ABNORMAL LOW (ref 4.0–10.5)
nRBC: 1.9 % — ABNORMAL HIGH (ref 0.0–0.2)

## 2023-05-28 LAB — SAMPLE TO BLOOD BANK

## 2023-05-28 LAB — PREPARE RBC (CROSSMATCH)

## 2023-05-28 LAB — MAGNESIUM: Magnesium: 1.4 mg/dL — ABNORMAL LOW (ref 1.7–2.4)

## 2023-05-28 MED ORDER — HEPARIN SOD (PORK) LOCK FLUSH 100 UNIT/ML IV SOLN
500.0000 [IU] | Freq: Every day | INTRAVENOUS | Status: AC | PRN
  Administered 2023-05-28: 500 [IU]

## 2023-05-28 MED ORDER — POTASSIUM CHLORIDE CRYS ER 20 MEQ PO TBCR
40.0000 meq | EXTENDED_RELEASE_TABLET | Freq: Once | ORAL | Status: AC
  Administered 2023-05-28: 40 meq via ORAL
  Filled 2023-05-28: qty 2

## 2023-05-28 MED ORDER — MAGNESIUM SULFATE 2 GM/50ML IV SOLN
2.0000 g | Freq: Once | INTRAVENOUS | Status: AC
  Administered 2023-05-28: 2 g via INTRAVENOUS
  Filled 2023-05-28: qty 50

## 2023-05-28 MED ORDER — POTASSIUM CHLORIDE CRYS ER 20 MEQ PO TBCR: 20.0000 meq | EXTENDED_RELEASE_TABLET | Freq: Two times a day (BID) | ORAL | 0 refills | Status: DC

## 2023-05-28 MED ORDER — SODIUM CHLORIDE 0.9% IV SOLUTION
250.0000 mL | INTRAVENOUS | Status: DC
Start: 2023-05-28 — End: 2023-05-28
  Administered 2023-05-28: 100 mL via INTRAVENOUS

## 2023-05-28 MED ORDER — MAGNESIUM OXIDE 250 MG PO TABS: 250.0000 mg | ORAL_TABLET | Freq: Every day | ORAL | 2 refills | Status: DC

## 2023-05-28 MED ORDER — SODIUM CHLORIDE 0.9% FLUSH
10.0000 mL | INTRAVENOUS | Status: AC | PRN
Start: 2023-05-28 — End: 2023-05-28
  Administered 2023-05-28: 10 mL

## 2023-05-28 MED ORDER — ACETAMINOPHEN 325 MG PO TABS
650.0000 mg | ORAL_TABLET | Freq: Once | ORAL | Status: AC
  Administered 2023-05-28: 650 mg via ORAL
  Filled 2023-05-28: qty 2

## 2023-05-28 NOTE — Patient Instructions (Signed)

## 2023-05-29 ENCOUNTER — Encounter (HOSPITAL_BASED_OUTPATIENT_CLINIC_OR_DEPARTMENT_OTHER): Payer: Self-pay | Admitting: Cardiology

## 2023-05-29 LAB — TYPE AND SCREEN
ABO/RH(D): A POS
Antibody Screen: NEGATIVE
Unit division: 0

## 2023-05-29 LAB — BPAM RBC
Blood Product Expiration Date: 202505022359
ISSUE DATE / TIME: 202504211101
Unit Type and Rh: 6200

## 2023-05-31 ENCOUNTER — Other Ambulatory Visit: Payer: Self-pay | Admitting: *Deleted

## 2023-05-31 ENCOUNTER — Inpatient Hospital Stay

## 2023-05-31 VITALS — BP 130/63 | HR 62 | Temp 97.8°F | Resp 18 | Ht 64.0 in | Wt 110.8 lb

## 2023-05-31 DIAGNOSIS — C9200 Acute myeloblastic leukemia, not having achieved remission: Secondary | ICD-10-CM | POA: Diagnosis not present

## 2023-05-31 LAB — CBC WITH DIFFERENTIAL (CANCER CENTER ONLY)
Abs Immature Granulocytes: 0.06 10*3/uL (ref 0.00–0.07)
Basophils Absolute: 0 10*3/uL (ref 0.0–0.1)
Basophils Relative: 0 %
Eosinophils Absolute: 0 10*3/uL (ref 0.0–0.5)
Eosinophils Relative: 0 %
HCT: 29.7 % — ABNORMAL LOW (ref 36.0–46.0)
Hemoglobin: 9.8 g/dL — ABNORMAL LOW (ref 12.0–15.0)
Immature Granulocytes: 5 %
Lymphocytes Relative: 68 %
Lymphs Abs: 0.8 10*3/uL (ref 0.7–4.0)
MCH: 30.2 pg (ref 26.0–34.0)
MCHC: 33 g/dL (ref 30.0–36.0)
MCV: 91.7 fL (ref 80.0–100.0)
Monocytes Absolute: 0.2 10*3/uL (ref 0.1–1.0)
Monocytes Relative: 15 %
Neutro Abs: 0.1 10*3/uL — CL (ref 1.7–7.7)
Neutrophils Relative %: 12 %
Platelet Count: 111 10*3/uL — ABNORMAL LOW (ref 150–400)
RBC: 3.24 MIL/uL — ABNORMAL LOW (ref 3.87–5.11)
RDW: 18.9 % — ABNORMAL HIGH (ref 11.5–15.5)
WBC Count: 1.2 10*3/uL — ABNORMAL LOW (ref 4.0–10.5)
nRBC: 0 % (ref 0.0–0.2)

## 2023-05-31 LAB — CMP (CANCER CENTER ONLY)
ALT: 61 U/L — ABNORMAL HIGH (ref 0–44)
AST: 67 U/L — ABNORMAL HIGH (ref 15–41)
Albumin: 3.2 g/dL — ABNORMAL LOW (ref 3.5–5.0)
Alkaline Phosphatase: 425 U/L — ABNORMAL HIGH (ref 38–126)
Anion gap: 7 (ref 5–15)
BUN: 22 mg/dL (ref 8–23)
CO2: 28 mmol/L (ref 22–32)
Calcium: 8.8 mg/dL — ABNORMAL LOW (ref 8.9–10.3)
Chloride: 104 mmol/L (ref 98–111)
Creatinine: 0.72 mg/dL (ref 0.44–1.00)
GFR, Estimated: >60 mL/min (ref >60)
Glucose, Bld: 113 mg/dL — ABNORMAL HIGH (ref 70–99)
Potassium: 4.4 mmol/L (ref 3.5–5.1)
Sodium: 139 mmol/L (ref 135–145)
Total Bilirubin: 1.1 mg/dL (ref 0.0–1.2)
Total Protein: 6.7 g/dL (ref 6.5–8.1)

## 2023-05-31 LAB — SAMPLE TO BLOOD BANK

## 2023-05-31 LAB — MAGNESIUM: Magnesium: 1.8 mg/dL (ref 1.7–2.4)

## 2023-05-31 MED ORDER — SODIUM CHLORIDE 0.9% FLUSH
10.0000 mL | INTRAVENOUS | Status: DC | PRN
  Administered 2023-05-31: 10 mL via INTRAVENOUS

## 2023-05-31 MED ORDER — HEPARIN SOD (PORK) LOCK FLUSH 100 UNIT/ML IV SOLN
500.0000 [IU] | Freq: Once | INTRAVENOUS | Status: AC
  Administered 2023-05-31: 500 [IU] via INTRAVENOUS

## 2023-05-31 NOTE — Progress Notes (Signed)
 Per Dr. Rosaline Coma- patient okay to be discharged today.  Patient's hemoglobin is 9.8, platelets are 111, potassium is 4.4, and magnesium  is 1.8.  Patient and sister aware of the results. Patient deaccessed and discharged via wheelchair to front lobby.

## 2023-06-04 ENCOUNTER — Inpatient Hospital Stay

## 2023-06-04 ENCOUNTER — Encounter: Payer: Self-pay | Admitting: *Deleted

## 2023-06-04 DIAGNOSIS — C9200 Acute myeloblastic leukemia, not having achieved remission: Secondary | ICD-10-CM

## 2023-06-04 LAB — CBC WITH DIFFERENTIAL (CANCER CENTER ONLY)
Abs Immature Granulocytes: 0 10*3/uL (ref 0.00–0.07)
Band Neutrophils: 1 %
Basophils Absolute: 0 10*3/uL (ref 0.0–0.1)
Basophils Relative: 0 %
Blasts: 5 %
Eosinophils Absolute: 0 10*3/uL (ref 0.0–0.5)
Eosinophils Relative: 0 %
HCT: 29.6 % — ABNORMAL LOW (ref 36.0–46.0)
Hemoglobin: 9.7 g/dL — ABNORMAL LOW (ref 12.0–15.0)
Lymphocytes Relative: 75 %
Lymphs Abs: 0.8 10*3/uL (ref 0.7–4.0)
MCH: 30.3 pg (ref 26.0–34.0)
MCHC: 32.8 g/dL (ref 30.0–36.0)
MCV: 92.5 fL (ref 80.0–100.0)
Metamyelocytes Relative: 1 %
Monocytes Absolute: 0.1 10*3/uL (ref 0.1–1.0)
Monocytes Relative: 13 %
Neutro Abs: 0.1 10*3/uL — CL (ref 1.7–7.7)
Neutrophils Relative %: 5 %
Platelet Count: 146 10*3/uL — ABNORMAL LOW (ref 150–400)
RBC: 3.2 MIL/uL — ABNORMAL LOW (ref 3.87–5.11)
RDW: 19.5 % — ABNORMAL HIGH (ref 11.5–15.5)
WBC Count: 1 10*3/uL — ABNORMAL LOW (ref 4.0–10.5)
nRBC: 2 % — ABNORMAL HIGH (ref 0.0–0.2)

## 2023-06-04 LAB — CMP (CANCER CENTER ONLY)
ALT: 85 U/L — ABNORMAL HIGH (ref 0–44)
AST: 101 U/L — ABNORMAL HIGH (ref 15–41)
Albumin: 3.2 g/dL — ABNORMAL LOW (ref 3.5–5.0)
Alkaline Phosphatase: 526 U/L — ABNORMAL HIGH (ref 38–126)
Anion gap: 9 (ref 5–15)
BUN: 20 mg/dL (ref 8–23)
CO2: 28 mmol/L (ref 22–32)
Calcium: 9 mg/dL (ref 8.9–10.3)
Chloride: 100 mmol/L (ref 98–111)
Creatinine: 0.8 mg/dL (ref 0.44–1.00)
GFR, Estimated: >60 mL/min (ref >60)
Glucose, Bld: 138 mg/dL — ABNORMAL HIGH (ref 70–99)
Potassium: 3.5 mmol/L (ref 3.5–5.1)
Sodium: 137 mmol/L (ref 135–145)
Total Bilirubin: 1.5 mg/dL — ABNORMAL HIGH (ref 0.0–1.2)
Total Protein: 6.7 g/dL (ref 6.5–8.1)

## 2023-06-04 LAB — SAMPLE TO BLOOD BANK

## 2023-06-04 LAB — MAGNESIUM: Magnesium: 1.7 mg/dL (ref 1.7–2.4)

## 2023-06-04 MED ORDER — SODIUM CHLORIDE 0.9% FLUSH
10.0000 mL | INTRAVENOUS | Status: DC | PRN
  Administered 2023-06-04: 10 mL via INTRAVENOUS

## 2023-06-04 MED ORDER — HEPARIN SOD (PORK) LOCK FLUSH 100 UNIT/ML IV SOLN
500.0000 [IU] | Freq: Once | INTRAVENOUS | Status: AC
  Administered 2023-06-04: 500 [IU] via INTRAVENOUS

## 2023-06-04 NOTE — Addendum Note (Signed)
 Addended by: Kennadie Brenner M on: 06/04/2023 08:53 AM   Modules accepted: Orders

## 2023-06-04 NOTE — Progress Notes (Signed)
 Blood counts within parameters. No transfusion needed per order and Dr. Rosaline Coma. Pt deaccessed. Lab results provided to pt. Left via wheelchair with sister. No distress noted.

## 2023-06-07 ENCOUNTER — Other Ambulatory Visit: Payer: Self-pay | Admitting: *Deleted

## 2023-06-07 ENCOUNTER — Inpatient Hospital Stay: Attending: Hematology and Oncology | Admitting: Hematology and Oncology

## 2023-06-07 ENCOUNTER — Inpatient Hospital Stay

## 2023-06-07 ENCOUNTER — Telehealth: Payer: Self-pay

## 2023-06-07 VITALS — BP 105/56 | HR 71 | Temp 98.0°F | Resp 14 | Wt 106.4 lb

## 2023-06-07 DIAGNOSIS — Z95828 Presence of other vascular implants and grafts: Secondary | ICD-10-CM

## 2023-06-07 DIAGNOSIS — Z87891 Personal history of nicotine dependence: Secondary | ICD-10-CM | POA: Diagnosis not present

## 2023-06-07 DIAGNOSIS — C9200 Acute myeloblastic leukemia, not having achieved remission: Secondary | ICD-10-CM | POA: Diagnosis not present

## 2023-06-07 LAB — SAMPLE TO BLOOD BANK

## 2023-06-07 LAB — CMP (CANCER CENTER ONLY)
ALT: 63 U/L — ABNORMAL HIGH (ref 0–44)
AST: 71 U/L — ABNORMAL HIGH (ref 15–41)
Albumin: 3.2 g/dL — ABNORMAL LOW (ref 3.5–5.0)
Alkaline Phosphatase: 334 U/L — ABNORMAL HIGH (ref 38–126)
Anion gap: 10 (ref 5–15)
BUN: 22 mg/dL (ref 8–23)
CO2: 27 mmol/L (ref 22–32)
Calcium: 8.9 mg/dL (ref 8.9–10.3)
Chloride: 100 mmol/L (ref 98–111)
Creatinine: 0.93 mg/dL (ref 0.44–1.00)
GFR, Estimated: >60 mL/min (ref >60)
Glucose, Bld: 133 mg/dL — ABNORMAL HIGH (ref 70–99)
Potassium: 3.7 mmol/L (ref 3.5–5.1)
Sodium: 137 mmol/L (ref 135–145)
Total Bilirubin: 1.4 mg/dL — ABNORMAL HIGH (ref 0.0–1.2)
Total Protein: 6.7 g/dL (ref 6.5–8.1)

## 2023-06-07 LAB — CBC WITH DIFFERENTIAL (CANCER CENTER ONLY)
Abs Immature Granulocytes: 0.06 10*3/uL (ref 0.00–0.07)
Basophils Absolute: 0 10*3/uL (ref 0.0–0.1)
Basophils Relative: 1 %
Eosinophils Absolute: 0 10*3/uL (ref 0.0–0.5)
Eosinophils Relative: 0 %
HCT: 27 % — ABNORMAL LOW (ref 36.0–46.0)
Hemoglobin: 8.8 g/dL — ABNORMAL LOW (ref 12.0–15.0)
Immature Granulocytes: 6 %
Lymphocytes Relative: 64 %
Lymphs Abs: 0.7 10*3/uL (ref 0.7–4.0)
MCH: 30.2 pg (ref 26.0–34.0)
MCHC: 32.6 g/dL (ref 30.0–36.0)
MCV: 92.8 fL (ref 80.0–100.0)
Monocytes Absolute: 0.2 10*3/uL (ref 0.1–1.0)
Monocytes Relative: 18 %
Neutro Abs: 0.1 10*3/uL — CL (ref 1.7–7.7)
Neutrophils Relative %: 11 %
Platelet Count: 130 10*3/uL — ABNORMAL LOW (ref 150–400)
RBC: 2.91 MIL/uL — ABNORMAL LOW (ref 3.87–5.11)
RDW: 19.1 % — ABNORMAL HIGH (ref 11.5–15.5)
WBC Count: 1 10*3/uL — ABNORMAL LOW (ref 4.0–10.5)
nRBC: 2 % — ABNORMAL HIGH (ref 0.0–0.2)

## 2023-06-07 LAB — PREPARE RBC (CROSSMATCH)

## 2023-06-07 LAB — MAGNESIUM: Magnesium: 1.6 mg/dL — ABNORMAL LOW (ref 1.7–2.4)

## 2023-06-07 MED ORDER — HEPARIN SOD (PORK) LOCK FLUSH 100 UNIT/ML IV SOLN
500.0000 [IU] | Freq: Once | INTRAVENOUS | Status: AC
  Administered 2023-06-07: 500 [IU] via INTRAVENOUS

## 2023-06-07 MED ORDER — SODIUM CHLORIDE 0.9% FLUSH
10.0000 mL | INTRAVENOUS | Status: DC | PRN
  Administered 2023-06-07: 10 mL via INTRAVENOUS

## 2023-06-07 MED ORDER — POTASSIUM CHLORIDE 20 MEQ/15ML (10%) PO SOLN: 20.0000 meq | Freq: Two times a day (BID) | ORAL | 1 refills | Status: DC

## 2023-06-07 NOTE — Telephone Encounter (Signed)
 CRITICAL VALUE STICKER  CRITICAL VALUE: ANC 0.1  RECEIVER (on-site recipient of call): A. Jonty Morrical, RN  DATE & TIME NOTIFIED: 06/07/2023  MESSENGER (representative from lab):Heather  MD NOTIFIED: Dr. Rosaline Coma  TIME OF NOTIFICATION: 4010  RESPONSE:

## 2023-06-07 NOTE — Progress Notes (Signed)
 [name] [phone]   [phone]  PROGRESS NOTE  Patient Care Team: Donnie Galea, MD as PCP - General (Family Medicine) Sheryle Donning, MD as PCP - Cardiology (Cardiology)  Hematological/Oncological History # Acute Myeloid Leukemia  08/09/2022: seen in [facility] emergency department for leukocytosis and anemia/thrombocytopenia. 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 Transferred to [facility] 7/3-7/18/2024: Admitted to [facility], treated with Azacytidine and Venetoclax. Course complicated by neutropenic fever.  7/19-7/25/2024: admitted for fluid overload/heart failure.   Interval History:  Phillis Orpilla 83 y.o. female with medical history significant for AML who presents for a follow up visit. The patient's last visit was on 05/14/2023. In the interim since the last visit [name] has had no major changes in her health.  On exam Mrs. Insley reports that [name] has been okay in interim since her last visit.  [name] reports that [name] feels very weak and tired.  [name] reports her energy is about a 2 out of 10.  [name] is not having any lightheadedness, dizziness, or shortness of breath.  [name] is also not having any fevers, chills, sweats.  [name] did have an episode of nausea vomiting as a result of her potassium pill.  [name] is hoping for a different formulation of the potassium.  [name] reports that [name] is doing her best to try to eat but her weight continues to drop.  [name] does enjoy [employer] as well as milkshakes.  Her sister notes [name] also enjoys boost.  Otherwise her health is stable.  A full 10 point ROS is otherwise negative.  Today we discussed options moving forward.  We discussed that he may be running out of treatments for her AML.  Additionally we discussed we are happy to continue supportive care but that if [name] wanted to pursue a more comfort based approach in the future that would be reasonable as well.  [name] voiced understanding of our concerns and plan moving forward.  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   Thyroid  disease     SURGICAL HISTORY: Past Surgical History:  Procedure Laterality Date   DILATION AND CURETTAGE OF UTERUS     menorrhagia   KNEE ARTHROSCOPY Right 10/06   TUBAL LIGATION      SOCIAL HISTORY: Social History   Socioeconomic History   Marital status: Divorced    Spouse name: Not on file   Number of children: 0   Years of education: Not on file   Highest education level: Not on file  Occupational History    Employer: [employer]  Tobacco Use   Smoking status: Former   Smokeless tobacco: Never  Advertising account planner   Vaping status: Never Used  Substance and Sexual Activity   Alcohol use: Yes    Comment: 1 every  few months   Drug use: No   Sexual activity: Not Currently    Partners: Male    Birth control/protection: Post-menopausal  Other Topics Concern   Not on file  Social History Narrative   Divorced.   High school graduate.   Previously worked at [employer] at [facility].   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   Social Drivers of Dispensing optician  Resource Strain: Not on file  Food Insecurity: No Food Insecurity (05/17/2023)   Hunger Vital Sign    Worried About Running Out of Food in the Last Year: Never true    Ran Out of Food in the Last Year: Never true  Transportation Needs: No Transportation Needs (05/17/2023)   PRAPARE - Administrator, Civil Service (Medical): No    Lack of Transportation (Non-Medical): No  Physical Activity: Not on file  Stress: Not on file  Social Connections: Socially Isolated (05/11/2023)   Social Connection and Isolation Panel [NHANES]    Frequency of Communication with Friends and Family: More than three times a week    Frequency of Social  Gatherings with Friends and Family: More than three times a week    Attends Religious Services: Never    Database administrator or Organizations: No    Attends Banker Meetings: Never    Marital Status: Divorced  Catering manager Violence: Not At Risk (05/17/2023)   Humiliation, Afraid, Rape, and Kick questionnaire    Fear of Current or Ex-Partner: No    Emotionally Abused: No    Physically Abused: No    Sexually Abused: No    FAMILY HISTORY: Family History  Problem Relation Age of Onset   Heart disease Mother    Diabetes Father    Heart failure Father    COPD Father    Hyperlipidemia Sister    Diabetes Brother    Colon cancer Neg Hx    Breast cancer Neg Hx     ALLERGIES:  is allergic to ambien [zolpidem], codeine, dust mite extract, grass pollen(k-o-r-t-swt vern), hydrocodone, statins, sulfa antibiotics, and zofran  [ondansetron ].  MEDICATIONS:  Current Outpatient Medications  Medication Sig Dispense Refill   potassium chloride  20 MEQ/15ML (10%) SOLN Take 15 mLs (20 mEq total) by mouth 2 (two) times daily. 473 mL 1   acyclovir  (ZOVIRAX ) 400 MG tablet Take 400 mg by mouth 2 (two) times daily.     ALPRAZolam  (XANAX ) 0.25 MG tablet TAKE 1 TABLET (0.25 MG TOTAL) BY MOUTH DAILY AS NEEDED. 30 tablet 3   Cyanocobalamin  (VITAMIN B-12 PO) Take 1,000 mcg by mouth daily.     furosemide (LASIX) 20 MG tablet Take 20 mg by mouth daily as needed for fluid. Prn for weight gain >5 lbs (Patient not taking: Reported on 05/10/2023)     levofloxacin  (LEVAQUIN ) 250 MG tablet Take 250 mg by mouth daily.     levothyroxine  (SYNTHROID ) 50 MCG tablet TAKE 1 TABLET (50 MCG TOTAL) BY MOUTH DAILY. 90 tablet 3   losartan  (COZAAR ) 25 MG tablet Take 1 tablet (25 mg total) by mouth daily.     Magnesium  Oxide 250 MG TABS Take 1 tablet (250 mg total) by mouth daily. 30 tablet 2   metoprolol  succinate (TOPROL -XL) 25 MG 24 hr tablet Take 1 tablet by mouth daily.     mirtazapine (REMERON) 15 MG tablet  Take 15 mg by mouth at bedtime.     polycarbophil (FIBERCON) 625 MG tablet Take 625 mg by mouth daily.     posaconazole  (NOXAFIL ) 100 MG TBEC delayed-release tablet Take 300 mg by mouth daily.     potassium chloride  SA (KLOR-CON  M) 20 MEQ tablet Take 1 tablet (20 mEq total) by mouth 2 (two) times daily for 7 days. 14 tablet 0   prochlorperazine  (COMPAZINE ) 5 MG tablet Take by mouth.     spironolactone  (ALDACTONE ) 25 MG tablet Take by mouth.  zinc  sulfate 220 (50 Zn) MG capsule Take 220 mg by mouth daily.     Current Facility-Administered Medications  Medication Dose Route Frequency Provider Last Rate Last Admin   sodium chloride  flush (NS) 0.9 % injection 10 mL  10 mL Intravenous PRN Barbarita Hutmacher T IV, MD   10 mL at 06/07/23 0960   Facility-Administered Medications Ordered in Other Visits  Medication Dose Route Frequency Provider Last Rate Last Admin   sodium chloride  flush (NS) 0.9 % injection 10 mL  10 mL Intravenous PRN Rogerio Clay IV, MD   10 mL at 06/07/23 0744    REVIEW OF SYSTEMS:   Constitutional: ( - ) fevers, ( - )  chills , ( - ) night sweats Eyes: ( - ) blurriness of vision, ( - ) double vision, ( - ) watery eyes Ears, nose, mouth, throat, and face: ( - ) mucositis, ( - ) sore throat Respiratory: ( - ) cough, ( - ) dyspnea, ( - ) wheezes Cardiovascular: ( - ) palpitation, ( - ) chest discomfort, ( - ) lower extremity swelling Gastrointestinal:  ( - ) nausea, ( - ) heartburn, ( - ) change in bowel habits Skin: ( - ) abnormal skin rashes Lymphatics: ( - ) new lymphadenopathy, ( - ) easy bruising Neurological: ( - ) numbness, ( - ) tingling, ( - ) new weaknesses Behavioral/Psych: ( - ) mood change, ( - ) new changes  All other systems were reviewed with the patient and are negative.  PHYSICAL EXAMINATION: ECOG PERFORMANCE STATUS: 2 - Symptomatic, <50% confined to bed  Vitals:   06/07/23 0834  BP: (!) 105/56  Pulse: 71  Resp: 14  Temp: 98 F (36.7 C)  SpO2: 100%       Filed Weights   06/07/23 0834  Weight: 106 lb 6.4 oz (48.3 kg)       GENERAL: Well-appearing elderly Caucasian female alert, no distress and comfortable SKIN: skin color, texture, turgor are normal, no rashes or significant lesions EYES: conjunctiva are pink and non-injected, sclera clear LUNGS: clear to auscultation and percussion with normal breathing effort HEART: regular rate & rhythm and no murmurs and no lower extremity edema Musculoskeletal: no cyanosis of digits and no clubbing  PSYCH: alert & oriented x 3, fluent speech NEURO: no focal motor/sensory deficits  LABORATORY DATA:  I have reviewed the data as listed    Latest Ref Rng & Units 06/07/2023    7:40 AM 06/04/2023    7:36 AM 05/31/2023    7:34 AM  CBC  WBC 4.0 - 10.5 K/uL 1.0  1.0  1.2   Hemoglobin 12.0 - 15.0 g/dL 8.8  9.7  9.8   Hematocrit 36.0 - 46.0 % 27.0  29.6  29.7   Platelets 150 - 400 K/uL 130  146  111        Latest Ref Rng & Units 06/07/2023    7:40 AM 06/04/2023    7:36 AM 05/31/2023    7:34 AM  CMP  Glucose 70 - 99 mg/dL 454  098  119   BUN 8 - 23 mg/dL 22  20  22    Creatinine 0.44 - 1.00 mg/dL 1.47  8.29  5.62   Sodium 135 - 145 mmol/L 137  137  139   Potassium 3.5 - 5.1 mmol/L 3.7  3.5  4.4   Chloride 98 - 111 mmol/L 100  100  104   CO2 22 - 32 mmol/L 27  28  28   Calcium 8.9 - 10.3 mg/dL 8.9  9.0  8.8   Total Protein 6.5 - 8.1 g/dL 6.7  6.7  6.7   Total Bilirubin 0.0 - 1.2 mg/dL 1.4  1.5  1.1   Alkaline Phos 38 - 126 U/L 334  526  425   AST 15 - 41 U/L 71  101  67   ALT 0 - 44 U/L 63  85  61     RADIOGRAPHIC STUDIES: CT CHEST W CONTRAST Result Date: 05/11/2023 CLINICAL DATA:  Pneumonia, complication suspected, general unwell feeling for a few days and worsening over the last few hours. History of AML. Ex-smoker. EXAM: CT CHEST WITH CONTRAST TECHNIQUE: Multidetector CT imaging of the chest was performed during intravenous contrast administration. RADIATION DOSE REDUCTION: This exam  was performed according to the departmental dose-optimization program which includes automated exposure control, adjustment of the mA and/or kV according to patient size and/or use of iterative reconstruction technique. CONTRAST:  75mL OMNIPAQUE  IOHEXOL  300 MG/ML  SOLN COMPARISON:  Chest radiograph 05/10/2023 and CTA chest 08/09/2022 FINDINGS: Cardiovascular: No pericardial effusion. Right chest wall Port-A-Cath tip the in the low SVC. Normal caliber aorta. Mild aortic atherosclerotic calcification. Mediastinum/Nodes: Trachea and esophagus are unremarkable. 1.1 cm precarinal node on series 2/image 25 is favored reactive. No additional enlarged lymph nodes. Lungs/Pleura: Patchy consolidative and airspace opacities in the bilateral upper and lower lobes suspicious for multifocal pneumonia. No pleural effusion or pneumothorax. Upper Abdomen: No acute abnormality. Musculoskeletal: No acute fracture. IMPRESSION: 1. Multifocal pneumonia. Follow-up in 6-8 weeks after treatment is recommended to ensure resolution. 2. Aortic Atherosclerosis (ICD10-I70.0). Electronically Signed   By: Rozell Cornet M.D.   On: 05/11/2023 02:10   DG Chest 2 View Result Date: 05/10/2023 CLINICAL DATA:  Chemo patient, fever EXAM: CHEST - 2 VIEW COMPARISON:  05/07/2023 FINDINGS: Right chest wall Port-A-Cath tip at the superior cavoatrial junction. Stable cardiomediastinal silhouette. Patchy airspace opacities in the right upper lobe suspicious for pneumonia. No pleural effusion or pneumothorax. No displaced rib fractures. IMPRESSION: Right upper lobe pneumonia. Follow-up in 6-8 weeks is recommended to ensure resolution. Electronically Signed   By: Rozell Cornet M.D.   On: 05/10/2023 21:03    ASSESSMENT & PLAN Eztli Deso 83 y.o. female with medical history significant for AML who presents for a follow up visit.  Previously we discussed the concept of comanaged care.  Comanaged care is when the patient has a local primary provider  who administers local support and therapy while expert advice and treatment recommendations are rendered by a cancer specialist at a large academic center.  In this arrangement we provide local support, labs, treatment, and emergency visits, however the major decisions regarding the course of treatment are decided by a physician at an academic medical center.  The patient voiced her understanding of comanaged care and was agreeable to proceeding forward with this care model.  # Acute Myeloid Leukemia  -- Patient currently follows with Dr. Corinne Dicker at [facility].   --Patient had bone marrow biopsy during hospitalization for neutropenic fever in March 2025.  Awaiting new treatment options/recommendations given concern for recurrence with 7% blast cells. -- We will provide supportive care as requested with labs on Mondays and Thursdays with transfusion as necessary -- Labs today show white blood cell count 1.0, hemoglobin 8.8, MCV 92.8, platelets 130 -- Recommend twice weekly lab checks with a return clinic visit in 8 weeks time.   # Gum Bleeding--resolved.  -- Patient  is having bleeding at the base of one of her left lower molars. -- Patient is not having any lightheadedness, dizziness, or shortness of breath. -- Recommended tranexamic acid  650 mg and 15 mL of water, swish and spit for 2 minutes. -- Strict return precautions if [name] were to develop any lightheadedness, dizziness, shortness of breath or if bleeding did not resolve by Wednesday. -- also received a prescription for Amicar   No orders of the defined types were placed in this encounter.   All questions were answered. The patient knows to call the clinic with any problems, questions or concerns.  A total of more than 30 minutes were spent on this encounter with face-to-face time and non-face-to-face time, including preparing to see the patient, ordering tests and/or medications, counseling the patient and coordination of  care as outlined above.   Rogerio Clay, MD Department of Hematology/Oncology [facility] Phone: [phone] Pager: [phone] Email: [email or web address]  06/07/2023 9:26 AM

## 2023-06-08 ENCOUNTER — Inpatient Hospital Stay

## 2023-06-08 DIAGNOSIS — C9200 Acute myeloblastic leukemia, not having achieved remission: Secondary | ICD-10-CM

## 2023-06-08 DIAGNOSIS — Z87891 Personal history of nicotine dependence: Secondary | ICD-10-CM | POA: Diagnosis not present

## 2023-06-08 MED ORDER — SODIUM CHLORIDE 0.9% FLUSH
10.0000 mL | INTRAVENOUS | Status: AC | PRN
  Administered 2023-06-08: 10 mL

## 2023-06-08 MED ORDER — HEPARIN SOD (PORK) LOCK FLUSH 100 UNIT/ML IV SOLN
500.0000 [IU] | Freq: Every day | INTRAVENOUS | Status: AC | PRN
  Administered 2023-06-08: 500 [IU]

## 2023-06-08 MED ORDER — ACETAMINOPHEN 325 MG PO TABS
650.0000 mg | ORAL_TABLET | Freq: Once | ORAL | Status: AC
Start: 2023-06-08 — End: 2023-06-08
  Administered 2023-06-08: 650 mg via ORAL
  Filled 2023-06-08: qty 2

## 2023-06-08 MED ORDER — SODIUM CHLORIDE 0.9% IV SOLUTION
250.0000 mL | INTRAVENOUS | Status: DC
Start: 2023-06-08 — End: 2023-06-08
  Administered 2023-06-08: 100 mL via INTRAVENOUS

## 2023-06-08 NOTE — Patient Instructions (Addendum)

## 2023-06-11 ENCOUNTER — Telehealth: Payer: Self-pay | Admitting: *Deleted

## 2023-06-11 ENCOUNTER — Inpatient Hospital Stay

## 2023-06-11 DIAGNOSIS — C9200 Acute myeloblastic leukemia, not having achieved remission: Secondary | ICD-10-CM

## 2023-06-11 DIAGNOSIS — Z87891 Personal history of nicotine dependence: Secondary | ICD-10-CM | POA: Diagnosis not present

## 2023-06-11 LAB — CBC WITH DIFFERENTIAL (CANCER CENTER ONLY)
Abs Immature Granulocytes: 0.06 10*3/uL (ref 0.00–0.07)
Basophils Absolute: 0 10*3/uL (ref 0.0–0.1)
Basophils Relative: 0 %
Eosinophils Absolute: 0 10*3/uL (ref 0.0–0.5)
Eosinophils Relative: 0 %
HCT: 31.5 % — ABNORMAL LOW (ref 36.0–46.0)
Hemoglobin: 10.2 g/dL — ABNORMAL LOW (ref 12.0–15.0)
Immature Granulocytes: 6 %
Lymphocytes Relative: 54 %
Lymphs Abs: 0.6 10*3/uL — ABNORMAL LOW (ref 0.7–4.0)
MCH: 29.7 pg (ref 26.0–34.0)
MCHC: 32.4 g/dL (ref 30.0–36.0)
MCV: 91.6 fL (ref 80.0–100.0)
Monocytes Absolute: 0.3 10*3/uL (ref 0.1–1.0)
Monocytes Relative: 25 %
Neutro Abs: 0.2 10*3/uL — CL (ref 1.7–7.7)
Neutrophils Relative %: 15 %
Platelet Count: 137 10*3/uL — ABNORMAL LOW (ref 150–400)
RBC: 3.44 MIL/uL — ABNORMAL LOW (ref 3.87–5.11)
RDW: 18.5 % — ABNORMAL HIGH (ref 11.5–15.5)
WBC Count: 1.1 10*3/uL — ABNORMAL LOW (ref 4.0–10.5)
nRBC: 0 % (ref 0.0–0.2)

## 2023-06-11 LAB — TYPE AND SCREEN
ABO/RH(D): A POS
Antibody Screen: NEGATIVE
Unit division: 0

## 2023-06-11 LAB — CMP (CANCER CENTER ONLY)
ALT: 62 U/L — ABNORMAL HIGH (ref 0–44)
AST: 51 U/L — ABNORMAL HIGH (ref 15–41)
Albumin: 3.3 g/dL — ABNORMAL LOW (ref 3.5–5.0)
Alkaline Phosphatase: 253 U/L — ABNORMAL HIGH (ref 38–126)
Anion gap: 10 (ref 5–15)
BUN: 24 mg/dL — ABNORMAL HIGH (ref 8–23)
CO2: 26 mmol/L (ref 22–32)
Calcium: 9.3 mg/dL (ref 8.9–10.3)
Chloride: 101 mmol/L (ref 98–111)
Creatinine: 0.81 mg/dL (ref 0.44–1.00)
GFR, Estimated: >60 mL/min (ref >60)
Glucose, Bld: 150 mg/dL — ABNORMAL HIGH (ref 70–99)
Potassium: 3.5 mmol/L (ref 3.5–5.1)
Sodium: 137 mmol/L (ref 135–145)
Total Bilirubin: 1.1 mg/dL (ref 0.0–1.2)
Total Protein: 7.1 g/dL (ref 6.5–8.1)

## 2023-06-11 LAB — MAGNESIUM: Magnesium: 1.8 mg/dL (ref 1.7–2.4)

## 2023-06-11 LAB — BPAM RBC
Blood Product Expiration Date: 202505192359
ISSUE DATE / TIME: 202505020849
Unit Type and Rh: 6200

## 2023-06-11 LAB — SAMPLE TO BLOOD BANK

## 2023-06-11 MED ORDER — HEPARIN SOD (PORK) LOCK FLUSH 100 UNIT/ML IV SOLN
500.0000 [IU] | Freq: Once | INTRAVENOUS | Status: AC
  Administered 2023-06-11: 500 [IU] via INTRAVENOUS

## 2023-06-11 MED ORDER — SODIUM CHLORIDE 0.9% FLUSH
10.0000 mL | INTRAVENOUS | Status: DC | PRN
  Administered 2023-06-11: 10 mL via INTRAVENOUS

## 2023-06-11 NOTE — Telephone Encounter (Signed)
 CRITICAL VALUE STICKER  CRITICAL VALUE: ANC 0.2  RECEIVER (on-site recipient of call): Skipper Dumas, RN  DATE & TIME NOTIFIED: 06/11/23  8:45 am  MESSENGER (representative from lab): Shelagh Derrick  MD NOTIFIED: Wyline Hearing, PA-C  TIME OF NOTIFICATION: 9am  RESPONSE:  No interventions needed  Spoke with pt and her sister regarding lab results. Copy of results given to pt.  She is aware on ongoing neutropenic precautions. Denies fever, chills, URI s/s Pt's sister states she is sleeping a lot, continues to not eat very much. Long discussion last week with Arzella Laurence regarding goals of care as pt seems to be slowly declining.  Pt lives alone though Arzella Laurence is there daily.  Pt is aware that eventually she will need to move in with her sister.  Pt has an appt with Dr. Corinne Dicker at [facility] next Monday, 06/18/23 and cardiology the next day. Dr. Rosaline Coma aware of pt's declining condition.

## 2023-06-11 NOTE — Progress Notes (Signed)
 No blood transfusion needed today per [facility]. Beth RN also notified about patient's skin irritation from dressing. RN advised pt to apply some hydrocortisone cream to area and to also use press-n-seal instead of tegaderm when applying lidocaine  cream.

## 2023-06-14 ENCOUNTER — Inpatient Hospital Stay

## 2023-06-14 VITALS — BP 108/79 | HR 65 | Temp 97.6°F | Resp 16

## 2023-06-14 DIAGNOSIS — C9200 Acute myeloblastic leukemia, not having achieved remission: Secondary | ICD-10-CM | POA: Diagnosis not present

## 2023-06-14 DIAGNOSIS — Z87891 Personal history of nicotine dependence: Secondary | ICD-10-CM | POA: Diagnosis not present

## 2023-06-14 LAB — CBC WITH DIFFERENTIAL (CANCER CENTER ONLY)
Abs Immature Granulocytes: 0.09 10*3/uL — ABNORMAL HIGH (ref 0.00–0.07)
Basophils Absolute: 0 10*3/uL (ref 0.0–0.1)
Basophils Relative: 0 %
Eosinophils Absolute: 0 10*3/uL (ref 0.0–0.5)
Eosinophils Relative: 0 %
HCT: 28.9 % — ABNORMAL LOW (ref 36.0–46.0)
Hemoglobin: 9.6 g/dL — ABNORMAL LOW (ref 12.0–15.0)
Immature Granulocytes: 9 %
Lymphocytes Relative: 59 %
Lymphs Abs: 0.6 10*3/uL — ABNORMAL LOW (ref 0.7–4.0)
MCH: 30.3 pg (ref 26.0–34.0)
MCHC: 33.2 g/dL (ref 30.0–36.0)
MCV: 91.2 fL (ref 80.0–100.0)
Monocytes Absolute: 0.2 10*3/uL (ref 0.1–1.0)
Monocytes Relative: 23 %
Neutro Abs: 0.1 10*3/uL — CL (ref 1.7–7.7)
Neutrophils Relative %: 9 %
Platelet Count: 156 10*3/uL (ref 150–400)
RBC: 3.17 MIL/uL — ABNORMAL LOW (ref 3.87–5.11)
RDW: 18.4 % — ABNORMAL HIGH (ref 11.5–15.5)
WBC Count: 1.1 10*3/uL — ABNORMAL LOW (ref 4.0–10.5)
nRBC: 0 % (ref 0.0–0.2)

## 2023-06-14 LAB — CMP (CANCER CENTER ONLY)
ALT: 59 U/L — ABNORMAL HIGH (ref 0–44)
AST: 56 U/L — ABNORMAL HIGH (ref 15–41)
Albumin: 3.3 g/dL — ABNORMAL LOW (ref 3.5–5.0)
Alkaline Phosphatase: 200 U/L — ABNORMAL HIGH (ref 38–126)
Anion gap: 9 (ref 5–15)
BUN: 23 mg/dL (ref 8–23)
CO2: 27 mmol/L (ref 22–32)
Calcium: 9.2 mg/dL (ref 8.9–10.3)
Chloride: 101 mmol/L (ref 98–111)
Creatinine: 0.83 mg/dL (ref 0.44–1.00)
GFR, Estimated: >60 mL/min (ref >60)
Glucose, Bld: 122 mg/dL — ABNORMAL HIGH (ref 70–99)
Potassium: 3.8 mmol/L (ref 3.5–5.1)
Sodium: 137 mmol/L (ref 135–145)
Total Bilirubin: 1.1 mg/dL (ref 0.0–1.2)
Total Protein: 7.1 g/dL (ref 6.5–8.1)

## 2023-06-14 LAB — MAGNESIUM: Magnesium: 1.8 mg/dL (ref 1.7–2.4)

## 2023-06-14 LAB — SAMPLE TO BLOOD BANK

## 2023-06-14 MED ORDER — SODIUM CHLORIDE 0.9% FLUSH
10.0000 mL | INTRAVENOUS | Status: DC | PRN
  Administered 2023-06-14: 10 mL via INTRAVENOUS

## 2023-06-14 MED ORDER — HEPARIN SOD (PORK) LOCK FLUSH 100 UNIT/ML IV SOLN
500.0000 [IU] | Freq: Once | INTRAVENOUS | Status: AC
  Administered 2023-06-14: 500 [IU] via INTRAVENOUS

## 2023-06-14 NOTE — Progress Notes (Signed)
 Patient here for port flush/labs.  HGB 9.6.  Beth Tracy/RN informed through secure chat.  States no blood transfusion needed.  Vital signs WNL as charted.  Patient and sister were informed and labs printed for patient.  Patient de accessed

## 2023-06-18 ENCOUNTER — Inpatient Hospital Stay

## 2023-06-18 DIAGNOSIS — C9200 Acute myeloblastic leukemia, not having achieved remission: Secondary | ICD-10-CM | POA: Diagnosis not present

## 2023-06-19 ENCOUNTER — Ambulatory Visit (HOSPITAL_BASED_OUTPATIENT_CLINIC_OR_DEPARTMENT_OTHER): Payer: Medicare Other | Admitting: Family

## 2023-06-21 ENCOUNTER — Inpatient Hospital Stay

## 2023-06-21 ENCOUNTER — Other Ambulatory Visit: Payer: Self-pay | Admitting: *Deleted

## 2023-06-21 DIAGNOSIS — C9200 Acute myeloblastic leukemia, not having achieved remission: Secondary | ICD-10-CM | POA: Diagnosis not present

## 2023-06-21 DIAGNOSIS — Z87891 Personal history of nicotine dependence: Secondary | ICD-10-CM | POA: Diagnosis not present

## 2023-06-21 LAB — CMP (CANCER CENTER ONLY)
ALT: 66 U/L — ABNORMAL HIGH (ref 0–44)
AST: 63 U/L — ABNORMAL HIGH (ref 15–41)
Albumin: 3.1 g/dL — ABNORMAL LOW (ref 3.5–5.0)
Alkaline Phosphatase: 198 U/L — ABNORMAL HIGH (ref 38–126)
Anion gap: 9 (ref 5–15)
BUN: 27 mg/dL — ABNORMAL HIGH (ref 8–23)
CO2: 27 mmol/L (ref 22–32)
Calcium: 9 mg/dL (ref 8.9–10.3)
Chloride: 103 mmol/L (ref 98–111)
Creatinine: 0.9 mg/dL (ref 0.44–1.00)
GFR, Estimated: >60 mL/min (ref >60)
Glucose, Bld: 107 mg/dL — ABNORMAL HIGH (ref 70–99)
Potassium: 3.4 mmol/L — ABNORMAL LOW (ref 3.5–5.1)
Sodium: 139 mmol/L (ref 135–145)
Total Bilirubin: 1.1 mg/dL (ref 0.0–1.2)
Total Protein: 6.7 g/dL (ref 6.5–8.1)

## 2023-06-21 LAB — CBC WITH DIFFERENTIAL (CANCER CENTER ONLY)
Abs Immature Granulocytes: 0.07 10*3/uL (ref 0.00–0.07)
Basophils Absolute: 0 10*3/uL (ref 0.0–0.1)
Basophils Relative: 0 %
Eosinophils Absolute: 0 10*3/uL (ref 0.0–0.5)
Eosinophils Relative: 0 %
HCT: 25.3 % — ABNORMAL LOW (ref 36.0–46.0)
Hemoglobin: 8.2 g/dL — ABNORMAL LOW (ref 12.0–15.0)
Immature Granulocytes: 8 %
Lymphocytes Relative: 63 %
Lymphs Abs: 0.5 10*3/uL — ABNORMAL LOW (ref 0.7–4.0)
MCH: 30 pg (ref 26.0–34.0)
MCHC: 32.4 g/dL (ref 30.0–36.0)
MCV: 92.7 fL (ref 80.0–100.0)
Monocytes Absolute: 0.2 10*3/uL (ref 0.1–1.0)
Monocytes Relative: 21 %
Neutro Abs: 0.1 10*3/uL — CL (ref 1.7–7.7)
Neutrophils Relative %: 8 %
Platelet Count: 145 10*3/uL — ABNORMAL LOW (ref 150–400)
RBC: 2.73 MIL/uL — ABNORMAL LOW (ref 3.87–5.11)
RDW: 18.5 % — ABNORMAL HIGH (ref 11.5–15.5)
WBC Count: 0.9 10*3/uL — CL (ref 4.0–10.5)
nRBC: 0 % (ref 0.0–0.2)

## 2023-06-21 LAB — SAMPLE TO BLOOD BANK

## 2023-06-21 LAB — MAGNESIUM: Magnesium: 1.6 mg/dL — ABNORMAL LOW (ref 1.7–2.4)

## 2023-06-21 LAB — PREPARE RBC (CROSSMATCH)

## 2023-06-21 MED ORDER — SODIUM CHLORIDE 0.9% FLUSH
10.0000 mL | INTRAVENOUS | Status: DC | PRN
  Administered 2023-06-21: 10 mL via INTRAVENOUS

## 2023-06-21 MED ORDER — HEPARIN SOD (PORK) LOCK FLUSH 100 UNIT/ML IV SOLN
500.0000 [IU] | Freq: Once | INTRAVENOUS | Status: AC
  Administered 2023-06-21: 500 [IU] via INTRAVENOUS

## 2023-06-21 NOTE — Progress Notes (Signed)
 CRITICAL VALUE STICKER  CRITICAL VALUE: WBC 0.9, ANC 0.1  RECEIVER (on-site recipient of call): [facility]    DATE & TIME NOTIFIED: 5/15 @ 0825  MESSENGER (representative from lab): Amber   MD NOTIFIED: Dr. Rosaline Coma   TIME OF NOTIFICATION: [phone]

## 2023-06-21 NOTE — Progress Notes (Signed)
 Patient here for port flush/labs.  HGB 8.2.  Beth Tracey/RN and Irene/PA made aware through secure chat.  Delores Fester states patient is to get one unit of blood.  Beth scheduled it for 06/22/23.  Patient and sister are made aware

## 2023-06-22 ENCOUNTER — Inpatient Hospital Stay

## 2023-06-22 DIAGNOSIS — C9200 Acute myeloblastic leukemia, not having achieved remission: Secondary | ICD-10-CM | POA: Diagnosis not present

## 2023-06-22 DIAGNOSIS — Z87891 Personal history of nicotine dependence: Secondary | ICD-10-CM | POA: Diagnosis not present

## 2023-06-22 MED ORDER — ACETAMINOPHEN 325 MG PO TABS
650.0000 mg | ORAL_TABLET | Freq: Once | ORAL | Status: AC
Start: 2023-06-22 — End: 2023-06-22
  Administered 2023-06-22: 650 mg via ORAL
  Filled 2023-06-22: qty 2

## 2023-06-22 MED ORDER — SODIUM CHLORIDE 0.9% IV SOLUTION
250.0000 mL | INTRAVENOUS | Status: DC
  Administered 2023-06-22: 100 mL via INTRAVENOUS

## 2023-06-22 NOTE — Patient Instructions (Signed)

## 2023-06-25 ENCOUNTER — Inpatient Hospital Stay

## 2023-06-25 DIAGNOSIS — C9200 Acute myeloblastic leukemia, not having achieved remission: Secondary | ICD-10-CM

## 2023-06-25 DIAGNOSIS — Z87891 Personal history of nicotine dependence: Secondary | ICD-10-CM | POA: Diagnosis not present

## 2023-06-25 LAB — CMP (CANCER CENTER ONLY)
ALT: 53 U/L — ABNORMAL HIGH (ref 0–44)
AST: 39 U/L (ref 15–41)
Albumin: 3.2 g/dL — ABNORMAL LOW (ref 3.5–5.0)
Alkaline Phosphatase: 159 U/L — ABNORMAL HIGH (ref 38–126)
Anion gap: 8 (ref 5–15)
BUN: 18 mg/dL (ref 8–23)
CO2: 27 mmol/L (ref 22–32)
Calcium: 9 mg/dL (ref 8.9–10.3)
Chloride: 102 mmol/L (ref 98–111)
Creatinine: 0.8 mg/dL (ref 0.44–1.00)
GFR, Estimated: >60 mL/min (ref >60)
Glucose, Bld: 130 mg/dL — ABNORMAL HIGH (ref 70–99)
Potassium: 3.7 mmol/L (ref 3.5–5.1)
Sodium: 137 mmol/L (ref 135–145)
Total Bilirubin: 1.2 mg/dL (ref 0.0–1.2)
Total Protein: 6.6 g/dL (ref 6.5–8.1)

## 2023-06-25 LAB — CBC WITH DIFFERENTIAL (CANCER CENTER ONLY)
Abs Immature Granulocytes: 0.06 K/uL (ref 0.00–0.07)
Basophils Absolute: 0 K/uL (ref 0.0–0.1)
Basophils Relative: 0 %
Eosinophils Absolute: 0 K/uL (ref 0.0–0.5)
Eosinophils Relative: 0 %
HCT: 30.2 % — ABNORMAL LOW (ref 36.0–46.0)
Hemoglobin: 10 g/dL — ABNORMAL LOW (ref 12.0–15.0)
Immature Granulocytes: 6 %
Lymphocytes Relative: 56 %
Lymphs Abs: 0.5 K/uL — ABNORMAL LOW (ref 0.7–4.0)
MCH: 30.2 pg (ref 26.0–34.0)
MCHC: 33.1 g/dL (ref 30.0–36.0)
MCV: 91.2 fL (ref 80.0–100.0)
Monocytes Absolute: 0.3 K/uL (ref 0.1–1.0)
Monocytes Relative: 26 %
Neutro Abs: 0.1 K/uL — CL (ref 1.7–7.7)
Neutrophils Relative %: 12 %
Platelet Count: 104 K/uL — ABNORMAL LOW (ref 150–400)
RBC: 3.31 MIL/uL — ABNORMAL LOW (ref 3.87–5.11)
RDW: 17.4 % — ABNORMAL HIGH (ref 11.5–15.5)
WBC Count: 1 K/uL — ABNORMAL LOW (ref 4.0–10.5)
nRBC: 0 % (ref 0.0–0.2)

## 2023-06-25 LAB — TYPE AND SCREEN
ABO/RH(D): A POS
Antibody Screen: NEGATIVE
Unit division: 0

## 2023-06-25 LAB — BPAM RBC
Blood Product Expiration Date: 202505292359
ISSUE DATE / TIME: 202505161310
Unit Type and Rh: 6200

## 2023-06-25 LAB — MAGNESIUM: Magnesium: 1.6 mg/dL — ABNORMAL LOW (ref 1.7–2.4)

## 2023-06-25 LAB — SAMPLE TO BLOOD BANK

## 2023-06-25 MED ORDER — SODIUM CHLORIDE 0.9% FLUSH
10.0000 mL | INTRAVENOUS | Status: DC | PRN
  Administered 2023-06-25: 10 mL via INTRAVENOUS

## 2023-06-25 MED ORDER — HEPARIN SOD (PORK) LOCK FLUSH 100 UNIT/ML IV SOLN
500.0000 [IU] | Freq: Once | INTRAVENOUS | Status: AC
Start: 2023-06-25 — End: 2023-06-25
  Administered 2023-06-25: 500 [IU] via INTRAVENOUS

## 2023-06-25 NOTE — Progress Notes (Signed)
 CRITICAL VALUE STICKER  CRITICAL VALUE: ANC 0.1  RECEIVER (on-site recipient of call): Odilia Bennett   DATE & TIME NOTIFIED: 06/25/2023 @ 0851  MESSENGER (representative from lab): Amber  MD NOTIFIED: Dr. Arlena Belts   TIME OF NOTIFICATION: 0919  RESPONSE:

## 2023-06-28 ENCOUNTER — Inpatient Hospital Stay

## 2023-06-28 VITALS — BP 105/64 | HR 74 | Temp 98.4°F | Resp 15

## 2023-06-28 DIAGNOSIS — Z87891 Personal history of nicotine dependence: Secondary | ICD-10-CM | POA: Diagnosis not present

## 2023-06-28 DIAGNOSIS — C9200 Acute myeloblastic leukemia, not having achieved remission: Secondary | ICD-10-CM

## 2023-06-28 LAB — CBC WITH DIFFERENTIAL (CANCER CENTER ONLY)
Abs Immature Granulocytes: 0.05 10*3/uL (ref 0.00–0.07)
Basophils Absolute: 0 10*3/uL (ref 0.0–0.1)
Basophils Relative: 0 %
Eosinophils Absolute: 0 10*3/uL (ref 0.0–0.5)
Eosinophils Relative: 0 %
HCT: 29 % — ABNORMAL LOW (ref 36.0–46.0)
Hemoglobin: 9.5 g/dL — ABNORMAL LOW (ref 12.0–15.0)
Immature Granulocytes: 5 %
Lymphocytes Relative: 53 %
Lymphs Abs: 0.5 10*3/uL — ABNORMAL LOW (ref 0.7–4.0)
MCH: 30.2 pg (ref 26.0–34.0)
MCHC: 32.8 g/dL (ref 30.0–36.0)
MCV: 92.1 fL (ref 80.0–100.0)
Monocytes Absolute: 0.3 10*3/uL (ref 0.1–1.0)
Monocytes Relative: 33 %
Neutro Abs: 0.1 10*3/uL — CL (ref 1.7–7.7)
Neutrophils Relative %: 9 %
Platelet Count: 116 10*3/uL — ABNORMAL LOW (ref 150–400)
RBC: 3.15 MIL/uL — ABNORMAL LOW (ref 3.87–5.11)
RDW: 16.9 % — ABNORMAL HIGH (ref 11.5–15.5)
WBC Count: 1 10*3/uL — ABNORMAL LOW (ref 4.0–10.5)
nRBC: 0 % (ref 0.0–0.2)

## 2023-06-28 LAB — CMP (CANCER CENTER ONLY)
ALT: 33 U/L (ref 0–44)
AST: 29 U/L (ref 15–41)
Albumin: 3.3 g/dL — ABNORMAL LOW (ref 3.5–5.0)
Alkaline Phosphatase: 153 U/L — ABNORMAL HIGH (ref 38–126)
Anion gap: 9 (ref 5–15)
BUN: 19 mg/dL (ref 8–23)
CO2: 27 mmol/L (ref 22–32)
Calcium: 9.2 mg/dL (ref 8.9–10.3)
Chloride: 99 mmol/L (ref 98–111)
Creatinine: 0.85 mg/dL (ref 0.44–1.00)
GFR, Estimated: >60 mL/min (ref >60)
Glucose, Bld: 126 mg/dL — ABNORMAL HIGH (ref 70–99)
Potassium: 3.7 mmol/L (ref 3.5–5.1)
Sodium: 135 mmol/L (ref 135–145)
Total Bilirubin: 1.4 mg/dL — ABNORMAL HIGH (ref 0.0–1.2)
Total Protein: 7 g/dL (ref 6.5–8.1)

## 2023-06-28 LAB — SAMPLE TO BLOOD BANK

## 2023-06-28 LAB — MAGNESIUM: Magnesium: 1.7 mg/dL (ref 1.7–2.4)

## 2023-06-28 MED ORDER — SODIUM CHLORIDE 0.9% FLUSH
10.0000 mL | INTRAVENOUS | Status: DC | PRN
  Administered 2023-06-28: 10 mL via INTRAVENOUS

## 2023-06-28 MED ORDER — HEPARIN SOD (PORK) LOCK FLUSH 100 UNIT/ML IV SOLN
500.0000 [IU] | Freq: Once | INTRAVENOUS | Status: AC
  Administered 2023-06-28: 500 [IU] via INTRAVENOUS

## 2023-06-28 NOTE — Progress Notes (Signed)
 CRITICAL VALUE STICKER  CRITICAL VALUE: ANC 0.1  RECEIVER (on-site recipient of call): Odilia Bennett   DATE & TIME NOTIFIED: 5/22 @ 0830  MESSENGER (representative from lab): Maria   RESPONSE: patient has acute myeloid leukemia

## 2023-06-28 NOTE — Progress Notes (Signed)
 Patient here for port flush and labs. Vitals WNL. Hgb 9.5, Platelets 116 today. MD and RN notified. No blood needed today per Skipper Dumas, RN. Patient deaccessed and labs printed and given to patient.

## 2023-07-03 ENCOUNTER — Inpatient Hospital Stay

## 2023-07-03 ENCOUNTER — Telehealth: Payer: Self-pay

## 2023-07-03 DIAGNOSIS — C9200 Acute myeloblastic leukemia, not having achieved remission: Secondary | ICD-10-CM

## 2023-07-03 LAB — CMP (CANCER CENTER ONLY)
ALT: 29 U/L (ref 0–44)
AST: 33 U/L (ref 15–41)
Albumin: 3.2 g/dL — ABNORMAL LOW (ref 3.5–5.0)
Alkaline Phosphatase: 125 U/L (ref 38–126)
Anion gap: 9 (ref 5–15)
BUN: 20 mg/dL (ref 8–23)
CO2: 27 mmol/L (ref 22–32)
Calcium: 9 mg/dL (ref 8.9–10.3)
Chloride: 101 mmol/L (ref 98–111)
Creatinine: 0.73 mg/dL (ref 0.44–1.00)
GFR, Estimated: >60 mL/min (ref >60)
Glucose, Bld: 125 mg/dL — ABNORMAL HIGH (ref 70–99)
Potassium: 3.5 mmol/L (ref 3.5–5.1)
Sodium: 137 mmol/L (ref 135–145)
Total Bilirubin: 1.3 mg/dL — ABNORMAL HIGH (ref 0.0–1.2)
Total Protein: 6.8 g/dL (ref 6.5–8.1)

## 2023-07-03 LAB — CBC WITH DIFFERENTIAL (CANCER CENTER ONLY)
Abs Immature Granulocytes: 0.04 10*3/uL (ref 0.00–0.07)
Basophils Absolute: 0 10*3/uL (ref 0.0–0.1)
Basophils Relative: 0 %
Eosinophils Absolute: 0 10*3/uL (ref 0.0–0.5)
Eosinophils Relative: 0 %
HCT: 27.6 % — ABNORMAL LOW (ref 36.0–46.0)
Hemoglobin: 9 g/dL — ABNORMAL LOW (ref 12.0–15.0)
Immature Granulocytes: 4 %
Lymphocytes Relative: 63 %
Lymphs Abs: 0.6 10*3/uL — ABNORMAL LOW (ref 0.7–4.0)
MCH: 30.4 pg (ref 26.0–34.0)
MCHC: 32.6 g/dL (ref 30.0–36.0)
MCV: 93.2 fL (ref 80.0–100.0)
Monocytes Absolute: 0.2 10*3/uL (ref 0.1–1.0)
Monocytes Relative: 22 %
Neutro Abs: 0.1 10*3/uL — CL (ref 1.7–7.7)
Neutrophils Relative %: 11 %
Platelet Count: 132 10*3/uL — ABNORMAL LOW (ref 150–400)
RBC: 2.96 MIL/uL — ABNORMAL LOW (ref 3.87–5.11)
RDW: 17.2 % — ABNORMAL HIGH (ref 11.5–15.5)
WBC Count: 1 10*3/uL — ABNORMAL LOW (ref 4.0–10.5)
nRBC: 0 % (ref 0.0–0.2)

## 2023-07-03 LAB — SAMPLE TO BLOOD BANK

## 2023-07-03 LAB — MAGNESIUM: Magnesium: 1.7 mg/dL (ref 1.7–2.4)

## 2023-07-03 MED ORDER — HEPARIN SOD (PORK) LOCK FLUSH 100 UNIT/ML IV SOLN
500.0000 [IU] | Freq: Once | INTRAVENOUS | Status: AC
  Administered 2023-07-03: 500 [IU] via INTRAVENOUS

## 2023-07-03 MED ORDER — SODIUM CHLORIDE 0.9% FLUSH
10.0000 mL | INTRAVENOUS | Status: DC | PRN
  Administered 2023-07-03: 10 mL via INTRAVENOUS

## 2023-07-03 NOTE — Telephone Encounter (Signed)
 Pt here today for lab only appt.  Hgb 9.0 and ANC 0.1- lab is re checking diff  appt with Dr Rosaline Coma 5/28 at 10:00

## 2023-07-03 NOTE — Progress Notes (Signed)
 Pt's hgb 9.0 today. Does not meet parameters for transfusion per note. Dr. Rosaline Coma and charge RN notified. Ok to d/c home per doctor. Port deaccessed. D/c home with sister via w/c.

## 2023-07-04 ENCOUNTER — Inpatient Hospital Stay

## 2023-07-04 ENCOUNTER — Inpatient Hospital Stay: Admitting: Hematology and Oncology

## 2023-07-04 VITALS — BP 127/60 | HR 70 | Temp 97.8°F | Resp 14 | Wt 100.3 lb

## 2023-07-04 DIAGNOSIS — C9200 Acute myeloblastic leukemia, not having achieved remission: Secondary | ICD-10-CM

## 2023-07-04 DIAGNOSIS — Z95828 Presence of other vascular implants and grafts: Secondary | ICD-10-CM | POA: Diagnosis not present

## 2023-07-04 NOTE — Progress Notes (Signed)
 [facility] [phone]   [phone]  PROGRESS NOTE  Patient Care Team: Donnie Galea, MD as PCP - General (Family Medicine) Sheryle Donning, MD as PCP - Cardiology (Cardiology)  Hematological/Oncological History # Acute Myeloid Leukemia  08/09/2022: seen in [facility] emergency department for leukocytosis and anemia/thrombocytopenia. 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 Transferred to [facility] 7/3-7/18/2024: Admitted to [facility], treated with Azacytidine and Venetoclax. Course complicated by neutropenic fever.  7/19-7/25/2024: admitted for fluid overload/heart failure.   Interval History:  [name] 83 y.o. female with medical history significant for AML who presents for a follow up visit. The patient's last visit was on 05/14/2023. In the interim since the last visit she has had no major changes in her health.  On exam [name] reports she has been well overall in the interim since her last visit other than fatigue and poor appetite.  She reports she is not walking well and is currently in a chair today.  She reports she does not wish to go back to [facility] as she does not have any other treatment options available to her at this time.  She reports that she has had no issues with bleeding, bruising, or dark stools.  She notes that she has a poor appetite and simply does not have a desire to eat and that "nothing tastes good".  She notes that she is interested in hospice referral at this time.  We agreed to continue supportive care for the time being with twice weekly check-in's.  She notes that she is aware that this will continue to progress and that there is no cure for her current situation.  Will focus on supportive care and eventually comfort based care.  She voiced understanding of the plan and was in agreement.  A full 10 point ROS is otherwise negative.  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   Thyroid  disease     SURGICAL HISTORY: Past Surgical History:  Procedure Laterality Date   DILATION AND CURETTAGE OF UTERUS     menorrhagia   KNEE ARTHROSCOPY Right 10/06   TUBAL LIGATION      SOCIAL HISTORY: Social History   Socioeconomic History   Marital status: Divorced    Spouse name: Not on file   Number of children: 0   Years of education: Not on file   Highest education level: Not on file  Occupational History    Employer: [employer]  Tobacco Use   Smoking status: Former   Smokeless tobacco: Never  Advertising account planner   Vaping status: Never Used  Substance and Sexual Activity   Alcohol use: Yes    Comment: 1 every  few months   Drug use: No   Sexual activity: Not Currently    Partners: Male    Birth control/protection: Post-menopausal  Other Topics Concern   Not on file  Social History Narrative   Divorced.   High school graduate.   Previously worked at [employer] at [facility].   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   Social Drivers of Corporate investment banker Strain: Not on file  Food Insecurity: No Food Insecurity (05/17/2023)   Hunger Vital  Sign    Worried About Programme researcher, broadcasting/film/video in the Last Year: Never true    Ran Out of Food in the Last Year: Never true  Transportation Needs: No Transportation Needs (05/17/2023)   PRAPARE - Administrator, Civil Service (Medical): No    Lack of Transportation (Non-Medical): No  Physical Activity: Not on file  Stress: Not on file  Social Connections: Socially Isolated (05/11/2023)   Social Connection and Isolation Panel [NHANES]    Frequency of Communication with Friends and Family: More than three times a week    Frequency of Social Gatherings with Friends and Family: More than three times a week    Attends Religious Services:  Never    Database administrator or Organizations: No    Attends Banker Meetings: Never    Marital Status: Divorced  Catering manager Violence: Not At Risk (05/17/2023)   Humiliation, Afraid, Rape, and Kick questionnaire    Fear of Current or Ex-Partner: No    Emotionally Abused: No    Physically Abused: No    Sexually Abused: No    FAMILY HISTORY: Family History  Problem Relation Age of Onset   Heart disease Mother    Diabetes Father    Heart failure Father    COPD Father    Hyperlipidemia Sister    Diabetes Brother    Colon cancer Neg Hx    Breast cancer Neg Hx     ALLERGIES:  is allergic to ambien [zolpidem], codeine, dust mite extract, grass pollen(k-o-r-t-swt vern), hydrocodone, statins, sulfa antibiotics, and zofran  [ondansetron ].  MEDICATIONS:  Current Outpatient Medications  Medication Sig Dispense Refill   spironolactone  (ALDACTONE ) 25 MG tablet Take by mouth.     acyclovir  (ZOVIRAX ) 400 MG tablet Take 400 mg by mouth 2 (two) times daily.     ALPRAZolam  (XANAX ) 0.25 MG tablet TAKE 1 TABLET (0.25 MG TOTAL) BY MOUTH DAILY AS NEEDED. 30 tablet 3   Cyanocobalamin  (VITAMIN B-12 PO) Take 1,000 mcg by mouth daily.     furosemide (LASIX) 20 MG tablet Take 20 mg by mouth daily as needed for fluid. Prn for weight gain >5 lbs (Patient not taking: Reported on 05/10/2023)     levofloxacin  (LEVAQUIN ) 250 MG tablet Take 250 mg by mouth daily.     levothyroxine  (SYNTHROID ) 50 MCG tablet TAKE 1 TABLET (50 MCG TOTAL) BY MOUTH DAILY. 90 tablet 3   losartan  (COZAAR ) 25 MG tablet Take 1 tablet (25 mg total) by mouth daily.     Magnesium  Oxide 250 MG TABS Take 1 tablet (250 mg total) by mouth daily. 30 tablet 2   metoprolol  succinate (TOPROL -XL) 25 MG 24 hr tablet Take 1 tablet by mouth daily.     mirtazapine  (REMERON ) 15 MG tablet Take 15 mg by mouth at bedtime.     polycarbophil (FIBERCON) 625 MG tablet Take 625 mg by mouth daily.     posaconazole  (NOXAFIL ) 100 MG TBEC  delayed-release tablet Take 300 mg by mouth daily.     potassium chloride  20 MEQ/15ML (10%) SOLN Take 15 mLs (20 mEq total) by mouth 2 (two) times daily. 473 mL 1   potassium chloride  SA (KLOR-CON  M) 20 MEQ tablet Take 1 tablet (20 mEq total) by mouth 2 (two) times daily for 7 days. 14 tablet 0   prochlorperazine  (COMPAZINE ) 5 MG tablet Take by mouth.     zinc  sulfate 220 (50 Zn) MG capsule Take 220 mg by mouth daily.  No current facility-administered medications for this visit.    REVIEW OF SYSTEMS:   Constitutional: ( - ) fevers, ( - )  chills , ( - ) night sweats Eyes: ( - ) blurriness of vision, ( - ) double vision, ( - ) watery eyes Ears, nose, mouth, throat, and face: ( - ) mucositis, ( - ) sore throat Respiratory: ( - ) cough, ( - ) dyspnea, ( - ) wheezes Cardiovascular: ( - ) palpitation, ( - ) chest discomfort, ( - ) lower extremity swelling Gastrointestinal:  ( - ) nausea, ( - ) heartburn, ( - ) change in bowel habits Skin: ( - ) abnormal skin rashes Lymphatics: ( - ) new lymphadenopathy, ( - ) easy bruising Neurological: ( - ) numbness, ( - ) tingling, ( - ) new weaknesses Behavioral/Psych: ( - ) mood change, ( - ) new changes  All other systems were reviewed with the patient and are negative.  PHYSICAL EXAMINATION: ECOG PERFORMANCE STATUS: 2 - Symptomatic, <50% confined to bed  Vitals:   07/04/23 1005  BP: 127/60  Pulse: 70  Resp: 14  Temp: 97.8 F (36.6 C)  SpO2: 100%      Filed Weights   07/04/23 1005  Weight: 100 lb 4.8 oz (45.5 kg)       GENERAL: Well-appearing elderly Caucasian female alert, no distress and comfortable SKIN: skin color, texture, turgor are normal, no rashes or significant lesions EYES: conjunctiva are pink and non-injected, sclera clear LUNGS: clear to auscultation and percussion with normal breathing effort HEART: regular rate & rhythm and no murmurs and no lower extremity edema Musculoskeletal: no cyanosis of digits and no  clubbing  PSYCH: alert & oriented x 3, fluent speech NEURO: no focal motor/sensory deficits  LABORATORY DATA:  I have reviewed the data as listed    Latest Ref Rng & Units 07/03/2023    8:06 AM 06/28/2023    7:39 AM 06/25/2023    7:42 AM  CBC  WBC 4.0 - 10.5 K/uL 1.0  1.0  1.0   Hemoglobin 12.0 - 15.0 g/dL 9.0  9.5  40.9   Hematocrit 36.0 - 46.0 % 27.6  29.0  30.2   Platelets 150 - 400 K/uL 132  116  104        Latest Ref Rng & Units 07/03/2023    8:06 AM 06/28/2023    7:39 AM 06/25/2023    7:42 AM  CMP  Glucose 70 - 99 mg/dL 811  914  782   BUN 8 - 23 mg/dL 20  19  18    Creatinine 0.44 - 1.00 mg/dL 9.56  2.13  0.86   Sodium 135 - 145 mmol/L 137  135  137   Potassium 3.5 - 5.1 mmol/L 3.5  3.7  3.7   Chloride 98 - 111 mmol/L 101  99  102   CO2 22 - 32 mmol/L 27  27  27    Calcium 8.9 - 10.3 mg/dL 9.0  9.2  9.0   Total Protein 6.5 - 8.1 g/dL 6.8  7.0  6.6   Total Bilirubin 0.0 - 1.2 mg/dL 1.3  1.4  1.2   Alkaline Phos 38 - 126 U/L 125  153  159   AST 15 - 41 U/L 33  29  39   ALT 0 - 44 U/L 29  33  53     RADIOGRAPHIC STUDIES: No results found.   ASSESSMENT & PLAN [name] 83 y.o. female with medical  history significant for AML who presents for a follow up visit.  Previously we discussed the concept of comanaged care.  Comanaged care is when the patient has a local primary provider who administers local support and therapy while expert advice and treatment recommendations are rendered by a cancer specialist at a large academic center.  In this arrangement we provide local support, labs, treatment, and emergency visits, however the major decisions regarding the course of treatment are decided by a physician at an academic medical center.  The patient voiced her understanding of comanaged care and was agreeable to proceeding forward with this care model.  # Acute Myeloid Leukemia  -- Patient followed with Dr. Corinne Dicker at [facility]. No further treatment  options were available, patient would prefer to follow here for supportive care alone.  --Patient had bone marrow biopsy during hospitalization for neutropenic fever in March 2025. Bmbx showed recurrence with 7% blast cells. -- We will provide supportive care as requested with labs on Mondays and Thursdays with transfusion as necessary -- Labs today show white blood cell count 1.0, hemoglobin 9.0, MCV 93.2, platelets 132 --had GOC discussion today, patient would like to continue supportive care at this time but voices understanding about the prognosis of her condition.  She also understands that we are doing supportive care only and that there will come a time or we need to transition to comfort based care. -- Recommend twice weekly lab checks with a return clinic visit in 4 weeks time.   # Gum Bleeding--resolved.  -- Patient is having bleeding at the base of one of her left lower molars. -- Patient is not having any lightheadedness, dizziness, or shortness of breath. -- Recommended tranexamic acid  650 mg and 15 mL of water, swish and spit for 2 minutes. -- Strict return precautions if she were to develop any lightheadedness, dizziness, shortness of breath or if bleeding did not resolve by Wednesday. -- also received a prescription for Amicar   No orders of the defined types were placed in this encounter.   All questions were answered. The patient knows to call the clinic with any problems, questions or concerns.  A total of more than 30 minutes were spent on this encounter with face-to-face time and non-face-to-face time, including preparing to see the patient, ordering tests and/or medications, counseling the patient and coordination of care as outlined above.   Rogerio Clay, MD Department of Hematology/Oncology [facility] at [facility] Phone: [phone] Pager: [phone] Email: [email or web address]  07/04/2023 7:27 PM

## 2023-07-04 NOTE — Progress Notes (Signed)
 Goals of care discussion occurred between Dr. Rosaline Coma and  Ms. Slaydon and her sister, Arzella Laurence. They are agreeable to Hospice referral. Referral made to [facility] today, 07/04/23 Pt still desires supportive blood transfusions at this time. Hospice made aware of this.

## 2023-07-06 ENCOUNTER — Inpatient Hospital Stay

## 2023-07-06 ENCOUNTER — Encounter (HOSPITAL_COMMUNITY): Payer: Self-pay | Admitting: Internal Medicine

## 2023-07-06 ENCOUNTER — Inpatient Hospital Stay (HOSPITAL_COMMUNITY)
Admission: AD | Admit: 2023-07-06 | Discharge: 2023-07-10 | DRG: 809 | Disposition: A | Discharge status: Hospice | Source: Ambulatory Visit | Attending: Internal Medicine | Admitting: Internal Medicine

## 2023-07-06 ENCOUNTER — Other Ambulatory Visit: Payer: Self-pay | Admitting: *Deleted

## 2023-07-06 ENCOUNTER — Inpatient Hospital Stay (HOSPITAL_COMMUNITY)

## 2023-07-06 ENCOUNTER — Telehealth: Payer: Self-pay | Admitting: *Deleted

## 2023-07-06 ENCOUNTER — Other Ambulatory Visit: Payer: Self-pay

## 2023-07-06 ENCOUNTER — Encounter (HOSPITAL_COMMUNITY): Payer: Self-pay

## 2023-07-06 VITALS — BP 118/56 | HR 71 | Temp 100.5°F | Resp 16

## 2023-07-06 DIAGNOSIS — R627 Adult failure to thrive: Secondary | ICD-10-CM | POA: Diagnosis present

## 2023-07-06 DIAGNOSIS — I10 Essential (primary) hypertension: Secondary | ICD-10-CM | POA: Diagnosis present

## 2023-07-06 DIAGNOSIS — R634 Abnormal weight loss: Secondary | ICD-10-CM | POA: Diagnosis not present

## 2023-07-06 DIAGNOSIS — D709 Neutropenia, unspecified: Principal | ICD-10-CM | POA: Diagnosis present

## 2023-07-06 DIAGNOSIS — Z7189 Other specified counseling: Secondary | ICD-10-CM | POA: Diagnosis not present

## 2023-07-06 DIAGNOSIS — Z83438 Family history of other disorder of lipoprotein metabolism and other lipidemia: Secondary | ICD-10-CM | POA: Diagnosis not present

## 2023-07-06 DIAGNOSIS — Z885 Allergy status to narcotic agent status: Secondary | ICD-10-CM | POA: Diagnosis not present

## 2023-07-06 DIAGNOSIS — Z79899 Other long term (current) drug therapy: Secondary | ICD-10-CM

## 2023-07-06 DIAGNOSIS — C9200 Acute myeloblastic leukemia, not having achieved remission: Secondary | ICD-10-CM

## 2023-07-06 DIAGNOSIS — I5022 Chronic systolic (congestive) heart failure: Secondary | ICD-10-CM | POA: Diagnosis present

## 2023-07-06 DIAGNOSIS — Z681 Body mass index (BMI) 19 or less, adult: Secondary | ICD-10-CM | POA: Diagnosis not present

## 2023-07-06 DIAGNOSIS — R5081 Fever presenting with conditions classified elsewhere: Principal | ICD-10-CM | POA: Diagnosis present

## 2023-07-06 DIAGNOSIS — I110 Hypertensive heart disease with heart failure: Secondary | ICD-10-CM | POA: Diagnosis not present

## 2023-07-06 DIAGNOSIS — Z515 Encounter for palliative care: Secondary | ICD-10-CM | POA: Diagnosis not present

## 2023-07-06 DIAGNOSIS — Z66 Do not resuscitate: Secondary | ICD-10-CM | POA: Diagnosis not present

## 2023-07-06 DIAGNOSIS — R451 Restlessness and agitation: Secondary | ICD-10-CM | POA: Diagnosis present

## 2023-07-06 DIAGNOSIS — R531 Weakness: Secondary | ICD-10-CM | POA: Diagnosis not present

## 2023-07-06 DIAGNOSIS — R54 Age-related physical debility: Secondary | ICD-10-CM | POA: Diagnosis present

## 2023-07-06 DIAGNOSIS — Z825 Family history of asthma and other chronic lower respiratory diseases: Secondary | ICD-10-CM

## 2023-07-06 DIAGNOSIS — Z8249 Family history of ischemic heart disease and other diseases of the circulatory system: Secondary | ICD-10-CM

## 2023-07-06 DIAGNOSIS — Z888 Allergy status to other drugs, medicaments and biological substances status: Secondary | ICD-10-CM

## 2023-07-06 DIAGNOSIS — G2581 Restless legs syndrome: Secondary | ICD-10-CM | POA: Diagnosis present

## 2023-07-06 DIAGNOSIS — Z833 Family history of diabetes mellitus: Secondary | ICD-10-CM

## 2023-07-06 DIAGNOSIS — D61818 Other pancytopenia: Secondary | ICD-10-CM | POA: Diagnosis not present

## 2023-07-06 DIAGNOSIS — I5020 Unspecified systolic (congestive) heart failure: Secondary | ICD-10-CM | POA: Diagnosis present

## 2023-07-06 DIAGNOSIS — Z882 Allergy status to sulfonamides status: Secondary | ICD-10-CM | POA: Diagnosis not present

## 2023-07-06 DIAGNOSIS — Z87891 Personal history of nicotine dependence: Secondary | ICD-10-CM | POA: Diagnosis not present

## 2023-07-06 DIAGNOSIS — D708 Other neutropenia: Principal | ICD-10-CM | POA: Diagnosis present

## 2023-07-06 DIAGNOSIS — Z79890 Hormone replacement therapy (postmenopausal): Secondary | ICD-10-CM | POA: Diagnosis not present

## 2023-07-06 DIAGNOSIS — R000 Tachycardia, unspecified: Secondary | ICD-10-CM | POA: Diagnosis present

## 2023-07-06 DIAGNOSIS — E039 Hypothyroidism, unspecified: Secondary | ICD-10-CM | POA: Diagnosis present

## 2023-07-06 DIAGNOSIS — H5789 Other specified disorders of eye and adnexa: Secondary | ICD-10-CM | POA: Diagnosis present

## 2023-07-06 DIAGNOSIS — Z452 Encounter for adjustment and management of vascular access device: Secondary | ICD-10-CM | POA: Diagnosis not present

## 2023-07-06 LAB — CBC WITH DIFFERENTIAL (CANCER CENTER ONLY)
Abs Immature Granulocytes: 0.05 10*3/uL (ref 0.00–0.07)
Basophils Absolute: 0 10*3/uL (ref 0.0–0.1)
Basophils Relative: 0 %
Eosinophils Absolute: 0 10*3/uL (ref 0.0–0.5)
Eosinophils Relative: 0 %
HCT: 26.2 % — ABNORMAL LOW (ref 36.0–46.0)
Hemoglobin: 8.5 g/dL — ABNORMAL LOW (ref 12.0–15.0)
Immature Granulocytes: 5 %
Lymphocytes Relative: 55 %
Lymphs Abs: 0.5 10*3/uL — ABNORMAL LOW (ref 0.7–4.0)
MCH: 30.5 pg (ref 26.0–34.0)
MCHC: 32.4 g/dL (ref 30.0–36.0)
MCV: 93.9 fL (ref 80.0–100.0)
Monocytes Absolute: 0.3 10*3/uL (ref 0.1–1.0)
Monocytes Relative: 27 %
Neutro Abs: 0.1 10*3/uL — CL (ref 1.7–7.7)
Neutrophils Relative %: 13 %
Platelet Count: 130 10*3/uL — ABNORMAL LOW (ref 150–400)
RBC: 2.79 MIL/uL — ABNORMAL LOW (ref 3.87–5.11)
RDW: 17.5 % — ABNORMAL HIGH (ref 11.5–15.5)
WBC Count: 1 10*3/uL — ABNORMAL LOW (ref 4.0–10.5)
nRBC: 0 % (ref 0.0–0.2)

## 2023-07-06 LAB — CBC
HCT: 30.5 % — ABNORMAL LOW (ref 36.0–46.0)
Hemoglobin: 9.4 g/dL — ABNORMAL LOW (ref 12.0–15.0)
MCH: 29.8 pg (ref 26.0–34.0)
MCHC: 30.8 g/dL (ref 30.0–36.0)
MCV: 96.8 fL (ref 80.0–100.0)
Platelets: 76 10*3/uL — ABNORMAL LOW (ref 150–400)
RBC: 3.15 MIL/uL — ABNORMAL LOW (ref 3.87–5.11)
RDW: 18.1 % — ABNORMAL HIGH (ref 11.5–15.5)
WBC: 1.5 10*3/uL — ABNORMAL LOW (ref 4.0–10.5)
nRBC: 0 % (ref 0.0–0.2)

## 2023-07-06 LAB — CMP (CANCER CENTER ONLY)
ALT: 35 U/L (ref 0–44)
AST: 32 U/L (ref 15–41)
Albumin: 3.3 g/dL — ABNORMAL LOW (ref 3.5–5.0)
Alkaline Phosphatase: 131 U/L — ABNORMAL HIGH (ref 38–126)
Anion gap: 7 (ref 5–15)
BUN: 29 mg/dL — ABNORMAL HIGH (ref 8–23)
CO2: 29 mmol/L (ref 22–32)
Calcium: 9.1 mg/dL (ref 8.9–10.3)
Chloride: 102 mmol/L (ref 98–111)
Creatinine: 0.76 mg/dL (ref 0.44–1.00)
GFR, Estimated: >60 mL/min (ref >60)
Glucose, Bld: 117 mg/dL — ABNORMAL HIGH (ref 70–99)
Potassium: 3.9 mmol/L (ref 3.5–5.1)
Sodium: 138 mmol/L (ref 135–145)
Total Bilirubin: 1.3 mg/dL — ABNORMAL HIGH (ref 0.0–1.2)
Total Protein: 7 g/dL (ref 6.5–8.1)

## 2023-07-06 LAB — RESPIRATORY PANEL BY PCR

## 2023-07-06 LAB — CREATININE, SERUM
Creatinine, Ser: 0.84 mg/dL (ref 0.44–1.00)
GFR, Estimated: >60 mL/min (ref >60)

## 2023-07-06 LAB — MAGNESIUM: Magnesium: 1.9 mg/dL (ref 1.7–2.4)

## 2023-07-06 LAB — PREPARE RBC (CROSSMATCH)

## 2023-07-06 LAB — SAMPLE TO BLOOD BANK

## 2023-07-06 MED ORDER — SODIUM CHLORIDE 0.9% FLUSH
3.0000 mL | Freq: Two times a day (BID) | INTRAVENOUS | Status: DC
  Administered 2023-07-06 – 2023-07-09 (×6): 3 mL via INTRAVENOUS

## 2023-07-06 MED ORDER — MIRTAZAPINE 15 MG PO TABS
15.0000 mg | ORAL_TABLET | Freq: Every day | ORAL | Status: DC
  Administered 2023-07-06 – 2023-07-08 (×3): 15 mg via ORAL
  Filled 2023-07-06 (×3): qty 1

## 2023-07-06 MED ORDER — TRAZODONE HCL 50 MG PO TABS: 25.0000 mg | ORAL_TABLET | Freq: Every evening | ORAL | Status: DC | PRN

## 2023-07-06 MED ORDER — ONDANSETRON HCL 4 MG/2ML IJ SOLN: 4.0000 mg | Freq: Four times a day (QID) | INTRAMUSCULAR | Status: DC | PRN

## 2023-07-06 MED ORDER — SODIUM CHLORIDE 0.9 % IV SOLN
2.0000 g | Freq: Once | INTRAVENOUS | Status: AC
  Administered 2023-07-06: 2 g via INTRAVENOUS

## 2023-07-06 MED ORDER — POLYETHYLENE GLYCOL 3350 17 G PO PACK: 17.0000 g | PACK | Freq: Every day | ORAL | Status: DC | PRN

## 2023-07-06 MED ORDER — VANCOMYCIN HCL 500 MG/100ML IV SOLN
500.0000 mg | INTRAVENOUS | Status: DC
  Administered 2023-07-07 – 2023-07-08 (×2): 500 mg via INTRAVENOUS
  Filled 2023-07-06 (×2): qty 100

## 2023-07-06 MED ORDER — ACETAMINOPHEN 650 MG RE SUPP: 650.0000 mg | Freq: Four times a day (QID) | RECTAL | Status: DC | PRN

## 2023-07-06 MED ORDER — ACETAMINOPHEN 325 MG PO TABS
650.0000 mg | ORAL_TABLET | Freq: Once | ORAL | Status: AC
  Administered 2023-07-06: 650 mg via ORAL
  Filled 2023-07-06: qty 2

## 2023-07-06 MED ORDER — SPIRONOLACTONE 25 MG PO TABS
25.0000 mg | ORAL_TABLET | Freq: Every day | ORAL | Status: DC
  Administered 2023-07-06 – 2023-07-09 (×4): 25 mg via ORAL
  Filled 2023-07-06 (×4): qty 1

## 2023-07-06 MED ORDER — SODIUM CHLORIDE 0.9 % IV SOLN
2.0000 g | Freq: Two times a day (BID) | INTRAVENOUS | Status: DC
  Administered 2023-07-07 – 2023-07-09 (×5): 2 g via INTRAVENOUS
  Filled 2023-07-06 (×6): qty 12.5

## 2023-07-06 MED ORDER — SODIUM CHLORIDE 0.9 % IV SOLN: 250.0000 mL | INTRAVENOUS | Status: AC | PRN

## 2023-07-06 MED ORDER — ACETAMINOPHEN 325 MG PO TABS
650.0000 mg | ORAL_TABLET | Freq: Four times a day (QID) | ORAL | Status: DC | PRN
  Administered 2023-07-06 – 2023-07-07 (×2): 650 mg via ORAL
  Filled 2023-07-06 (×2): qty 2

## 2023-07-06 MED ORDER — VANCOMYCIN HCL IN DEXTROSE 1-5 GM/200ML-% IV SOLN
1000.0000 mg | Freq: Once | INTRAVENOUS | Status: AC
  Administered 2023-07-06: 1000 mg via INTRAVENOUS
  Filled 2023-07-06: qty 200

## 2023-07-06 MED ORDER — OXYCODONE HCL 5 MG PO TABS: 5.0000 mg | ORAL_TABLET | ORAL | Status: DC | PRN

## 2023-07-06 MED ORDER — ACYCLOVIR 400 MG PO TABS
400.0000 mg | ORAL_TABLET | Freq: Two times a day (BID) | ORAL | Status: DC
  Administered 2023-07-06 – 2023-07-09 (×6): 400 mg via ORAL
  Filled 2023-07-06 (×6): qty 1

## 2023-07-06 MED ORDER — MAGNESIUM OXIDE -MG SUPPLEMENT 400 (240 MG) MG PO TABS
200.0000 mg | ORAL_TABLET | Freq: Every day | ORAL | Status: DC
  Administered 2023-07-06 – 2023-07-09 (×4): 200 mg via ORAL
  Filled 2023-07-06 (×4): qty 1

## 2023-07-06 MED ORDER — SODIUM CHLORIDE 0.9 % IV SOLN: 2.0000 g | Freq: Two times a day (BID) | INTRAVENOUS | Status: DC

## 2023-07-06 MED ORDER — ONDANSETRON HCL 4 MG PO TABS: 4.0000 mg | ORAL_TABLET | Freq: Four times a day (QID) | ORAL | Status: DC | PRN

## 2023-07-06 MED ORDER — OLOPATADINE HCL 0.1 % OP SOLN
1.0000 [drp] | Freq: Two times a day (BID) | OPHTHALMIC | Status: DC
  Administered 2023-07-06 – 2023-07-09 (×6): 1 [drp] via OPHTHALMIC
  Filled 2023-07-06 (×2): qty 5

## 2023-07-06 MED ORDER — LOSARTAN POTASSIUM 50 MG PO TABS
25.0000 mg | ORAL_TABLET | Freq: Every day | ORAL | Status: DC
  Administered 2023-07-07 – 2023-07-09 (×3): 25 mg via ORAL
  Filled 2023-07-06 (×3): qty 1

## 2023-07-06 MED ORDER — SODIUM CHLORIDE 0.9% IV SOLUTION
250.0000 mL | INTRAVENOUS | Status: DC
  Administered 2023-07-06: 100 mL via INTRAVENOUS

## 2023-07-06 MED ORDER — METOPROLOL SUCCINATE ER 25 MG PO TB24
25.0000 mg | ORAL_TABLET | Freq: Every day | ORAL | Status: DC
  Administered 2023-07-06 – 2023-07-09 (×4): 25 mg via ORAL
  Filled 2023-07-06 (×4): qty 1

## 2023-07-06 MED ORDER — ENOXAPARIN SODIUM 40 MG/0.4ML IJ SOSY: 40.0000 mg | PREFILLED_SYRINGE | INTRAMUSCULAR | Status: DC

## 2023-07-06 MED ORDER — SODIUM CHLORIDE 0.9% FLUSH
10.0000 mL | INTRAVENOUS | Status: DC | PRN
  Administered 2023-07-06 (×2): 10 mL via INTRAVENOUS

## 2023-07-06 MED ORDER — SODIUM CHLORIDE 0.9 % IV SOLN
INTRAVENOUS | Status: AC
Start: 2023-07-06 — End: 2023-07-07

## 2023-07-06 MED ORDER — SODIUM CHLORIDE 0.9% FLUSH: 3.0000 mL | INTRAVENOUS | Status: DC | PRN

## 2023-07-06 MED ORDER — ALPRAZOLAM 0.25 MG PO TABS: 0.2500 mg | ORAL_TABLET | Freq: Every day | ORAL | Status: DC | PRN

## 2023-07-06 MED ORDER — HEPARIN SOD (PORK) LOCK FLUSH 100 UNIT/ML IV SOLN: 500.0000 [IU] | Freq: Once | INTRAVENOUS | Status: DC

## 2023-07-06 MED ORDER — MAGNESIUM OXIDE 250 MG PO TABS: 250.0000 mg | ORAL_TABLET | Freq: Every day | ORAL | Status: DC

## 2023-07-06 MED ORDER — ENOXAPARIN SODIUM 30 MG/0.3ML IJ SOSY
30.0000 mg | PREFILLED_SYRINGE | INTRAMUSCULAR | Status: DC
  Administered 2023-07-06 – 2023-07-08 (×3): 30 mg via SUBCUTANEOUS
  Filled 2023-07-06 (×3): qty 0.3

## 2023-07-06 MED ORDER — POSACONAZOLE 100 MG PO TBEC
300.0000 mg | DELAYED_RELEASE_TABLET | Freq: Every day | ORAL | Status: DC
  Administered 2023-07-06 – 2023-07-09 (×4): 300 mg via ORAL
  Filled 2023-07-06 (×4): qty 3

## 2023-07-06 MED ORDER — POTASSIUM CHLORIDE 20 MEQ PO PACK
20.0000 meq | PACK | Freq: Two times a day (BID) | ORAL | Status: DC
  Administered 2023-07-06 – 2023-07-08 (×5): 20 meq via ORAL
  Filled 2023-07-06 (×6): qty 1

## 2023-07-06 MED ORDER — LEVOTHYROXINE SODIUM 50 MCG PO TABS
50.0000 ug | ORAL_TABLET | Freq: Every day | ORAL | Status: DC
  Administered 2023-07-07 – 2023-07-09 (×3): 50 ug via ORAL
  Filled 2023-07-06 (×3): qty 1

## 2023-07-06 NOTE — Telephone Encounter (Signed)
 Pt here at [facility] for labs and transfusion. Met with pt and her sister in the flush room. Pt is febrile @ 100.5, feels much weaker than normal. Doris (pt's sister) said Gaynell did not have a good night last night with extreme fatigue and fevers. Pt's ANC is 0.1 today HGB is 8.5 and she is requesting 1 unit of blood.  Advised Dr. Rosaline Coma of the above. Pt is to be admitted for neutropenic fever. She and her sister voice understanding. Pt is in the infusion room currently. Charge nurse made aware of pending admission.  Of note: Arzella Laurence has not heard from [facility] yet this week. Referral made on 07/04/23. Aretha Beers, NP in Palliative Care her has been made aware of pending Hospice referral so pt can be seen while an in-patient.

## 2023-07-06 NOTE — Progress Notes (Signed)
 Pharmacy Antibiotic Note  [name] is a 83 y.o. female admitted on 07/06/2023 with febrile neutropenia.  Pharmacy has been consulted for vancomycin  dosing. Pt also has been started on cefepime   Plan: Vancomycin  1000 mg IV x1 then vancomycin  500 mg IV q24h ( AUC 441, Scr rounded to 0.8, wt 43.4 kg)  Cefepime  2 gr IV q12h per MD   Height: 5\' 4"  (162.6 cm) Weight: 43.4 kg (95 lb 10.9 oz) IBW/kg (Calculated) : 54.7  Temp (24hrs), Avg:99 F (37.2 C), Min:98.4 F (36.9 C), Max:100.5 F (38.1 C)  Recent Labs  Lab 07/03/23 0806 07/06/23 0737  WBC 1.0* 1.0*  CREATININE 0.73 0.76    Estimated Creatinine Clearance: 38.4 mL/min (by C-G formula based on SCr of 0.76 mg/dL).    Allergies  Allergen Reactions   Ambien [Zolpidem] Other (See Comments)    Intolerant.   Codeine Nausea And Vomiting   Dust Mite Extract Cough   Grass Pollen(K-O-R-T-Swt Vern) Cough   Hydrocodone Nausea Only   Hydrocodone-Acetaminophen  Nausea Only   Statins Other (See Comments)    Muscle pain   Sulfa Antibiotics Nausea And Vomiting   Zofran  [Ondansetron ] Other (See Comments)    Inc in anxiety and doesn't help with nausea.      Antimicrobials this admission: 5/30 vancomycin  >>  5/30 cefepime  >>   Dose adjustments this admission:   Microbiology results:  BCx:  5/30 resp panel:    Van Gelinas, PharmD, BCPS 07/06/2023 3:52 PM

## 2023-07-06 NOTE — Patient Instructions (Signed)

## 2023-07-06 NOTE — Plan of Care (Signed)
 Patient is alert and oriented, sister in the room and assisted with admission questions. Medication reconciliation still pending and sister will call pharmacist back. Patient presented with fever and work up being done. Blood cultures completed, IB ABX started and IVF. No complains of pain, shortness of breath, or chest pain. Patient resting in bed. Call bell within reach and bed at lowest position.  Problem: Education: Goal: Knowledge of General Education information will improve Description: Including pain rating scale, medication(s)/side effects and non-pharmacologic comfort measures Outcome: Progressing   Problem: Health Behavior/Discharge Planning: Goal: Ability to manage health-related needs will improve Outcome: Progressing   Problem: Clinical Measurements: Goal: Ability to maintain clinical measurements within normal limits will improve Outcome: Progressing   Problem: Nutrition: Goal: Adequate nutrition will be maintained Outcome: Progressing   Problem: Coping: Goal: Level of anxiety will decrease Outcome: Progressing

## 2023-07-06 NOTE — Consult Note (Signed)
 Hematology/Oncology Progress Note  Clinical Summary: [name] is an 83 year old female with medical history significant for refractory AML who is currently admitted with neutropenic fever.  Interval History: --patient developed termperature of 100.5 F while in infusion area of cancer center (prior to receiving blood products) -- Patient received 1 unit of packed red blood cells as her hemoglobin was less than 9. -- Patient is not currently having any focal infectious symptoms such as runny nose, sore throat, cough. -- Patient feels generalized unwell, but no major changes from her last visit earlier this week. -- Labs today show white blood cell count 1.0, hemoglobin 8.5, MCV 93.9, platelets 130  O:  Vitals:   07/06/23 1312 07/06/23 1324  BP:  (!) 101/52  Pulse: 100 100  Resp:  16  Temp:  98.4 F (36.9 C)      Latest Ref Rng & Units 07/06/2023    3:01 PM 07/06/2023    7:37 AM 07/03/2023    8:06 AM  CMP  Glucose 70 - 99 mg/dL  696  295   BUN 8 - 23 mg/dL  29  20   Creatinine 2.84 - 1.00 mg/dL 1.32  4.40  1.02   Sodium 135 - 145 mmol/L  138  137   Potassium 3.5 - 5.1 mmol/L  3.9  3.5   Chloride 98 - 111 mmol/L  102  101   CO2 22 - 32 mmol/L  29  27   Calcium 8.9 - 10.3 mg/dL  9.1  9.0   Total Protein 6.5 - 8.1 g/dL  7.0  6.8   Total Bilirubin 0.0 - 1.2 mg/dL  1.3  1.3   Alkaline Phos 38 - 126 U/L  131  125   AST 15 - 41 U/L  32  33   ALT 0 - 44 U/L  35  29       Latest Ref Rng & Units 07/06/2023    3:01 PM 07/06/2023    7:37 AM 07/03/2023    8:06 AM  CBC  WBC 4.0 - 10.5 K/uL 1.5  1.0  1.0   Hemoglobin 12.0 - 15.0 g/dL 9.4  8.5  9.0   Hematocrit 36.0 - 46.0 % 30.5  26.2  27.6   Platelets 150 - 400 K/uL 76  130  132       GENERAL: Chronically ill-appearing elderly Caucasian female in NAD  SKIN: skin color, texture, turgor are normal, no rashes or significant lesions EYES: conjunctiva are pink and non-injected, sclera clear LUNGS: clear to auscultation and  percussion with normal breathing effort HEART: regular rate & rhythm and no murmurs and no lower extremity edema Musculoskeletal: no cyanosis of digits and no clubbing  PSYCH: alert & oriented x 3, fluent speech NEURO: no focal motor/sensory deficits  Assessment/Plan:  # Neutropenic Fever -- No indication for G-CSF as the patient has AML -- Patient has ANC of 0.1 and fever, meets criteria for neutropenic fever. -- Continue broad-spectrum antibiotics with cefepime  and vancomycin  -- Await culture data.  Awaiting blood cultures, urine culture, and chest x-ray. -- Goals of care discussions are appropriate with this patient as she has AML which will continue to progress.  She is run out of treatment options.  At this time we are offering supportive care only with transfusion support. -- Recommend transfusion for hemoglobin less than 9 and platelets less than 10 -- Oncology will continue to follow.  # Acute Myeloid Leukemia -Refractory  -- Patient followed with Dr. Corinne Dicker  at [facility]. No further treatment options were available, patient would prefer to follow here for supportive care alone.  --Patient had bone marrow biopsy during hospitalization for neutropenic fever in March 2025. Bmbx showed recurrence with 7% blast cells. --had GOC discussion today, patient would like to continue supportive care at this time but voices understanding about the prognosis of her condition.  She also understands that we are doing supportive care only and that there will come a time or we need to transition to comfort based care.   Rogerio Clay, MD Department of Hematology/Oncology [facility] at [facility] Phone: [phone] Pager: [phone] Email: [email or web address]

## 2023-07-06 NOTE — Progress Notes (Signed)
 Vital signs obtained and charted.  Beth/RN made aware of vital signs and lab results.  Patient pending a unit of blood infusion today per [facility].  Patient waiting in the blue room

## 2023-07-06 NOTE — Telephone Encounter (Signed)
 CRITICAL VALUE STICKER  CRITICAL VALUE: ANC 0.1  RECEIVER (on-site recipient of call): Skipper Dumas, RN  DATE & TIME NOTIFIED: 07/06/23 8:40am  MESSENGER (representative from lab):Marie  MD NOTIFIED: Dr. Rosaline Coma  TIME OF NOTIFICATION:8:45 am  RESPONSE:  Pt to be admitted as she is febrile @ 100.5

## 2023-07-06 NOTE — H&P (Signed)
 History and Physical    [name] WUJ:[id number] DOB: August 11, 1940 DOA: 07/06/2023  PCP: Donnie Galea, MD  Patient coming from: Infusion center  I have personally briefly reviewed patient's old medical records in [facility]  Chief Complaint: Febrile neutropenia  HPI: [name] is a 83 y.o. 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  Her temperature was noted to be 100.5 and she was feeling much weaker than normal.  Her ANC is 0.1 today.  Her hemoglobin is 8.5.  She is requesting 1 unit of packed red blood cells.  Patient reports frequently poor appetite but today was able to eat an entire ham sandwich in the infusion center.  She denies any headache, ear pain, throat pain, chest pain, shortness of breath, dysuria, leg pain or symptoms that would localize any infection.  She does state that she has had a dry cough for some time now but is unable to quantify how long.  Patient looks thin and states that she has lost weight When she was noted to be febrile with a low ANC she was referred to me for admission to the hospital for febrile neutropenia.   Review of Systems: As per HPI otherwise all other systems reviewed and  negative.   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   High school graduate.   Previously worked at [employer] at [employer].   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     reports that she has quit smoking. She has never used smokeless tobacco. She reports current alcohol use. She reports that she does not use drugs.  Allergies  Allergen Reactions   Ambien [Zolpidem] Other (See Comments)    "Intolerant"   Codeine Nausea And Vomiting   Dust Mite Extract Cough   Grass Pollen(K-O-R-T-Swt Vern) Cough   Hydrocodone Nausea Only   Hydrocodone-Acetaminophen  Nausea Only   Statins Other (See Comments)    Muscle pain   Sulfa Antibiotics Nausea And Vomiting   Zofran  [Ondansetron ] Anxiety and Other (See Comments)    Increase in anxiety and doesn't help with nausea    Family History  Problem Relation Age of Onset   Heart disease Mother    Diabetes Father    Heart failure Father    COPD Father    Hyperlipidemia Sister    Diabetes Brother    Colon cancer Neg Hx    Breast cancer Neg Hx      Prior to Admission medications   Medication Sig Start Date End Date Taking? Authorizing Provider  acyclovir  (ZOVIRAX ) 400 MG tablet Take 400 mg by mouth 2 (two) times daily. 08/24/22   [provider]  ALPRAZolam  (XANAX ) 0.25 MG tablet TAKE 1 TABLET (0.25 MG TOTAL) BY MOUTH DAILY AS NEEDED. 10/22/22   Donnie Galea, MD  Cyanocobalamin  (VITAMIN B-12 PO) Take 1,000 mcg by mouth daily.    [provider]  furosemide (LASIX) 20 MG tablet Take 20 mg by mouth daily as needed for fluid. Prn for weight gain >5 lbs Patient not taking: Reported on 05/10/2023 08/31/22   [provider]  levofloxacin  (LEVAQUIN ) 250 MG tablet Take 250 mg by mouth daily. 12/21/22 12/21/23  [provider]  levothyroxine  (SYNTHROID ) 50 MCG tablet TAKE 1 TABLET (50 MCG TOTAL) BY MOUTH DAILY. 12/25/22   Donnie Galea, MD  losartan  (COZAAR ) 25 MG tablet Take 1 tablet (25 mg total) by mouth daily. 04/04/23 07/03/23   Sheryle Donning, MD  Magnesium  Oxide 250 MG TABS Take 1 tablet (250 mg total) by mouth daily. 05/28/23   Dorsey, John T IV, MD  metoprolol  succinate (TOPROL -XL) 25 MG 24 hr tablet Take 1 tablet by mouth daily. 09/18/22   [provider]  mirtazapine (REMERON) 15 MG tablet Take 15 mg by mouth at bedtime.    [provider]  polycarbophil (FIBERCON) 625 MG tablet Take 625 mg by mouth daily.    [provider]  posaconazole  (NOXAFIL ) 100 MG TBEC delayed-release tablet Take 300 mg by mouth daily.    [provider]  potassium chloride  20 MEQ/15ML (10%) SOLN Take 15 mLs (20 mEq total) by mouth 2 (two) times daily. 06/07/23   Ander Bame, MD  potassium chloride  SA (KLOR-CON  M) 20 MEQ tablet Take 1 tablet (20 mEq total) by mouth 2 (two) times daily for 7 days. 05/28/23 06/04/23  Walisiewicz, Kaitlyn E, PA-C  prochlorperazine  (COMPAZINE ) 5 MG tablet Take by mouth. 08/24/22   [provider]  spironolactone  (ALDACTONE ) 25 MG tablet Take by mouth. 09/18/22   [provider]  zinc  sulfate 220 (50 Zn) MG capsule Take 220 mg by mouth daily.    [provider]    Physical Exam:  Constitutional: NAD, calm, comfortable Vitals:   07/06/23 1312 07/06/23 1324  BP:  (!) 101/52  Pulse: 100 100  Resp:  16  Temp:  98.4 F (36.9 C)  TempSrc:  Oral  Weight: 43.4 kg 43.4 kg  Height: 5\' 4"  (1.626 m) 5\' 4"  (1.626 m)   Eyes: PERRL, lids normal and conjunctivae pale ENMT: Mucous membranes pale and are moist. Posterior pharynx clear of any exudate or lesions.Normal dentition.  Neck: normal, supple, no masses, no thyromegaly Respiratory: clear to auscultation bilaterally, no wheezing, no crackles. Normal respiratory effort. No accessory muscle use.  Cardiovascular: Regular rate and rhythm, no murmurs / rubs / gallops. No extremity edema. 2+ pedal pulses. No carotid bruits.  Abdomen: no tenderness, no masses palpated. No hepatosplenomegaly. Bowel  sounds positive.  Musculoskeletal: no clubbing / cyanosis. No joint deformity upper and lower extremities. Good ROM, no contractures. Normal muscle tone.  Skin: no rashes, lesions, ulcers. No induration Neurologic: CN 2-12 grossly intact. Sensation intact, DTR normal. Strength 5/5 in all 4.  Psychiatric: Normal judgment and insight. Alert and oriented x 3. Normal mood.    Labs on Admission: I have personally reviewed following labs and imaging studies  CBC: Recent Labs  Lab 07/03/23 0806 07/06/23 0737 07/06/23 1501  WBC 1.0* 1.0* 1.5*  NEUTROABS 0.1* 0.1*  --   HGB 9.0* 8.5* 9.4*  HCT 27.6* 26.2* 30.5*  MCV 93.2 93.9 96.8  PLT 132*  130* 76*   Basic Metabolic Panel: Recent Labs  Lab 07/03/23 0806 07/06/23 0737 07/06/23 1501  NA 137 138  --   K 3.5 3.9  --   CL 101 102  --   CO2 27 29  --   GLUCOSE 125* 117*  --   BUN 20 29*  --   CREATININE 0.73 0.76 0.84  CALCIUM 9.0 9.1  --   MG 1.7 1.9  --    GFR: Estimated Creatinine Clearance: 36.6 mL/min (by C-G formula based on SCr of 0.84 mg/dL). Liver Function Tests: Recent Labs  Lab 07/03/23 0806 07/06/23 0737  AST 33 32  ALT 29 35  ALKPHOS 125 131*  BILITOT 1.3* 1.3*  PROT 6.8 7.0  ALBUMIN 3.2* 3.3*    Urine analysis:    Component Value Date/Time   COLORURINE YELLOW 05/10/2023 2328   APPEARANCEUR CLEAR 05/10/2023 2328   LABSPEC 1.005 05/10/2023 2328   PHURINE 6.0 05/10/2023 2328   GLUCOSEU NEGATIVE 05/10/2023 2328   GLUCOSEU NEGATIVE 08/08/2022 1618   HGBUR NEGATIVE 05/10/2023 2328   BILIRUBINUR NEGATIVE 05/10/2023 2328   KETONESUR NEGATIVE 05/10/2023 2328   PROTEINUR NEGATIVE 05/10/2023 2328   UROBILINOGEN 1.0 08/08/2022 1618   NITRITE NEGATIVE 05/10/2023 2328   LEUKOCYTESUR NEGATIVE 05/10/2023 2328    Radiological Exams on Admission: No results found.    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  Neutropenic fever associated with acute myelogenous leukemia having not achieved remission: -Admit to hospital for neutropenic fever - Begin cefepime  and vancomycin  - Culture blood urine - Check viral panel - Obtain chest x-ray - Monitor for further fevers  2.  Heart failure with reduced ejection fraction: - Patient takes losartan , metoprolol , and potassium chloride . - Will continue same. - Monitor oral fluid intake closely - Check daily weights  3.  Hypertension: - Continue home medications as above  4.  Hypothyroidism: - Continue home Synthroid  dosing - TSH on April 6 was within acceptable range continue current dose  5.  Advance care planning: Patient had a discussion with Dr. Rosaline Coma recently and plan is for discussion with hospice.  She has not yet heard from hospice and I will consult palliative care and hospice while patient is in the hospital. Additionally both patient and her sister seem to be struggling with the complexities of treatment and medications for the patient.  Going to recommend that the patient not return home alone but rather she stay with her sister and that we obtain more support at home if not hospice then home health nursing PT OT ST etc.  Case management has been consulted.   DVT prophylaxis: Enoxaparin  Code Status: Full code Family Communication: Spoke with patient and her sister who was present at the bedside Disposition Plan: Home to sisters house (hopefully patient will agree) Consults called: None Admission status: Inpatient   Ronny Colas MD FACP [facility] Pager [phone]  How to contact the [facility] Attending or Consulting provider 7A - 7P or covering provider during after hours 7P -7A, for this patient?  Check the care team in [facility] and look for a) attending/consulting [facility] provider listed and b) the [facility] team  listed Log into [email or web address] and use [facility] 's universal password to access. If you do not have the password,  please contact the hospital operator. Locate the [facility] provider you are looking for under [facility] and page to a number that you can be directly reached. If you still have difficulty reaching the provider, please page the [facility] (Director on Call) for the Hospitalists listed on amion for assistance.  If 7PM-7AM, please contact night-coverage [email or web address] Password [facility]  07/06/2023, 4:41 PM

## 2023-07-07 DIAGNOSIS — D709 Neutropenia, unspecified: Secondary | ICD-10-CM | POA: Diagnosis not present

## 2023-07-07 DIAGNOSIS — R5081 Fever presenting with conditions classified elsewhere: Secondary | ICD-10-CM | POA: Diagnosis not present

## 2023-07-07 LAB — BASIC METABOLIC PANEL WITH GFR
Anion gap: 9 (ref 5–15)
BUN: 24 mg/dL — ABNORMAL HIGH (ref 8–23)
CO2: 24 mmol/L (ref 22–32)
Calcium: 8.2 mg/dL — ABNORMAL LOW (ref 8.9–10.3)
Chloride: 105 mmol/L (ref 98–111)
Creatinine, Ser: 0.7 mg/dL (ref 0.44–1.00)
GFR, Estimated: >60 mL/min (ref >60)
Glucose, Bld: 123 mg/dL — ABNORMAL HIGH (ref 70–99)
Potassium: 4 mmol/L (ref 3.5–5.1)
Sodium: 138 mmol/L (ref 135–145)

## 2023-07-07 LAB — CBC
HCT: 27.2 % — ABNORMAL LOW (ref 36.0–46.0)
Hemoglobin: 8.8 g/dL — ABNORMAL LOW (ref 12.0–15.0)
MCH: 30.7 pg (ref 26.0–34.0)
MCHC: 32.4 g/dL (ref 30.0–36.0)
MCV: 94.8 fL (ref 80.0–100.0)
Platelets: 85 10*3/uL — ABNORMAL LOW (ref 150–400)
RBC: 2.87 MIL/uL — ABNORMAL LOW (ref 3.87–5.11)
RDW: 19.2 % — ABNORMAL HIGH (ref 11.5–15.5)
WBC: 1.2 10*3/uL — CL (ref 4.0–10.5)
nRBC: 0 % (ref 0.0–0.2)

## 2023-07-07 MED ORDER — OFLOXACIN 0.3 % OP SOLN
1.0000 [drp] | Freq: Four times a day (QID) | OPHTHALMIC | Status: DC
  Administered 2023-07-07 (×2): 1 [drp] via OPHTHALMIC
  Filled 2023-07-07: qty 5

## 2023-07-07 MED ORDER — ALPRAZOLAM 0.25 MG PO TABS: 0.2500 mg | ORAL_TABLET | Freq: Three times a day (TID) | ORAL | Status: DC | PRN

## 2023-07-07 NOTE — Progress Notes (Addendum)
   07/07/23 1420  TOC Brief Assessment  Insurance and Status Reviewed  Patient has primary care physician Yes  Home environment has been reviewed Pt was living alone/plav to DC to sister's home - sister close in age  Prior level of function: Needs Assistance  Prior/Current Home Services Current home services ([facility] treatment)  Transition of care needs transition of care needs identified, TOC will continue to follow (Additional support may be needed as pt DC to home of her sister.  Pt has significant needs and could be overwhelming if support not in place.)   See active TOC consult.  Addendum: CSW went to speak with pt at bedside, she was sound asleep, did not wake her.  TOC will follow up with pt to discuss DC plan/support needs.

## 2023-07-07 NOTE — Progress Notes (Signed)
 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  Patient is on supportive care, blood transfusions and platelet transfusion for her AML.  She is exhausted treatment.  She was seeing oncologist at [facility] then subsequently transferred back to [facility] just for continuing supportive cares.  She was seen at the cancer center with temperature 100.5, ANC 0.1 and hemoglobin 8.5.  Patient with chronic poor appetite.  She was sent to ER with oncology consultation.  Subjective: Patient was seen and examined.  No overnight events.  Patient currently denies any issues.  Her main issue is restless leg syndrome, poor appetite, losing weight and not sleeping well at night. Patient wishes she has good appetite. Her sister is coming today to meet her, she is aware about meeting with palliative care today.  Assessment & Plan:   Neutropenic fever associated with AML, severe neutropenia and pancytopenia: Patient is febrile with neutrophil count less than 0.1.  Blood cultures, pending negative so far. Respiratory virus panel negative. No focal source of infection. Chest x-ray essentially normal. Given severity of symptoms, will treat as neutropenic fever for at least achieving fever free for 24 hours or any cultures available. Patient is not candidate for colony-stimulating factor.  Pancytopenia: ANC 0.1, repeat labs pending Hemoglobin 8.8 after 1 unit of transfusion yesterday.  Will hold off on further transfusion. Platelets 85, no indication to transfuse now. Oncology following.  Chronic systolic heart failure: Euvolemic currently on losartan  metoprolol  and potassium.   Electrolytes pending.  Hypertension: As above.  Hypothyroidism: On Synthroid .  Goal of care: Advanced disease, exhausted treatment.  Patient will benefit with palliative and hospice discussions.  Currently remains full code.  Palliative care team consulted.  DVT prophylaxis: enoxaparin  (LOVENOX ) injection 30 mg Start: 07/06/23 1800   Code Status: Full code Family Communication: None at the bedside Disposition Plan: Status is: Inpatient Remains inpatient appropriate because: IV antibiotics, severe systemic disease     Consultants:  Oncology Palliative care  Procedures:  None  Antimicrobials:  Vancomycin  and cefepime  5/30---     Objective: Vitals:   07/06/23 1324 07/06/23 1838 07/06/23 2223 07/07/23 0115  BP: (!) 101/52 (!) 125/51 (!) 155/58 126/66  Pulse: 100 62 (!) 101 (!) 105  Resp: 16 16 18 18   Temp: 98.4 F (36.9 C) 97.9 F (36.6 C) 98.1 F (36.7 C) 98.1 F (36.7 C)  TempSrc: Oral Oral Oral Oral  SpO2:  100% 100% 100%  Weight: 43.4 kg     Height: 5\' 4"  (1.626 m)       Intake/Output Summary (Last 24 hours) at 07/07/2023 0722 Last data filed at 07/07/2023 5284 Gross per 24 hour  Intake 1607.84 ml  Output --  Net 1607.84 ml   Filed Weights   07/06/23 1312 07/06/23 1324  Weight: 43.4 kg 43.4 kg    Examination:  General: Looks fairly comfortable.  Alert awake and oriented.  Frail and debilitated. Cardiovascular: S1-S2 normal.  Regular rate rhythm. Respiratory: Bilateral clear.  No added sounds.  She has a port on the right chest wall. Gastrointestinal: Soft.  Nontender.  Bowel sound present. Ext: No swelling or edema.  No cyanosis. Neuro: Alert awake.  No neurodeficits. Musculoskeletal: No deformities.  Data Reviewed: I have personally reviewed following labs and imaging studies  CBC: Recent Labs  Lab 07/03/23 0806 07/06/23 0737 07/06/23 1501 07/07/23 0510  WBC 1.0* 1.0* 1.5* 1.2*  NEUTROABS 0.1* 0.1*  --   --   HGB 9.0* 8.5* 9.4*  8.8*  HCT 27.6* 26.2* 30.5* 27.2*  MCV 93.2 93.9 96.8 94.8  PLT 132* 130* 76* 85*   Basic Metabolic Panel: Recent Labs  Lab 07/03/23 0806 07/06/23 0737 07/06/23 1501 07/07/23 0510  NA 137 138  --  138  K 3.5 3.9  --  4.0  CL 101 102  --  105  CO2 27 29  --  24  GLUCOSE 125* 117*  --  123*  BUN 20 29*  --  24*  CREATININE 0.73 0.76 0.84 0.70  CALCIUM 9.0 9.1  --  8.2*  MG 1.7 1.9  --   --    GFR: Estimated Creatinine Clearance: 38.4 mL/min (by C-G formula based on SCr of 0.7 mg/dL). Liver Function Tests: Recent Labs  Lab 07/03/23 0806 07/06/23 0737  AST 33 32  ALT 29 35  ALKPHOS 125 131*  BILITOT 1.3* 1.3*  PROT 6.8 7.0  ALBUMIN 3.2* 3.3*   No results for input(s): "LIPASE", "AMYLASE" in the last 168 hours. No results for input(s): "AMMONIA" in the last 168 hours. Coagulation Profile: No results for input(s): "INR", "PROTIME" in the last 168 hours. Cardiac Enzymes: No results for input(s): "CKTOTAL", "CKMB", "CKMBINDEX", "TROPONINI" in the last 168 hours. BNP (last 3 results) No results for input(s): "PROBNP" in the last 8760 hours. HbA1C: No results for input(s): "HGBA1C" in the last 72 hours. CBG: No results for input(s): "GLUCAP" in the last 168 hours. Lipid Profile: No results for input(s): "CHOL", "HDL", "LDLCALC", "TRIG", "CHOLHDL", "LDLDIRECT" in the last 72 hours. Thyroid  Function Tests: No results for input(s): "TSH", "T4TOTAL", "FREET4", "T3FREE", "THYROIDAB" in the last 72 hours. Anemia Panel: No results for input(s): "VITAMINB12", "FOLATE", "FERRITIN", "TIBC", "IRON", "RETICCTPCT" in the last 72 hours. Sepsis Labs: No results for input(s): "PROCALCITON", "LATICACIDVEN" in the last 168 hours.  Recent Results (from the past 240 hours)  Respiratory (~20 pathogens) panel by PCR     Status: None   Collection Time: 07/06/23  1:44 PM   Specimen: Nasopharyngeal Swab; Respiratory  Result Value Ref Range Status   Adenovirus NOT DETECTED NOT DETECTED  Final   Coronavirus 229E NOT DETECTED NOT DETECTED Final    Comment: (NOTE) The Coronavirus on the Respiratory Panel, DOES NOT test for the novel  Coronavirus (2019 nCoV)    Coronavirus HKU1 NOT DETECTED NOT DETECTED Final   Coronavirus NL63 NOT DETECTED NOT DETECTED Final   Coronavirus OC43 NOT DETECTED NOT DETECTED Final   Metapneumovirus NOT DETECTED NOT DETECTED Final   Rhinovirus / Enterovirus NOT DETECTED NOT DETECTED Final   Influenza A NOT DETECTED NOT DETECTED Final   Influenza B NOT DETECTED NOT DETECTED Final   Parainfluenza Virus 1 NOT DETECTED NOT DETECTED Final   Parainfluenza Virus 2 NOT DETECTED NOT DETECTED Final   Parainfluenza Virus 3 NOT DETECTED NOT DETECTED Final   Parainfluenza Virus 4 NOT DETECTED NOT DETECTED Final   Respiratory Syncytial Virus NOT DETECTED NOT DETECTED Final   Bordetella pertussis NOT DETECTED NOT DETECTED Final   Bordetella Parapertussis NOT DETECTED NOT DETECTED Final   Chlamydophila pneumoniae NOT DETECTED NOT DETECTED Final   Mycoplasma pneumoniae NOT DETECTED NOT DETECTED Final    Comment: Performed at [facility], [address] [place], [place] [address]         Radiology Studies: DG Chest 2 View Result Date: 07/06/2023 CLINICAL DATA:  Neutropenic fevers EXAM: CHEST - 2 VIEW COMPARISON:  Chest x-ray 05/10/2023 skip FINDINGS: Right chest port catheter tip ends in the SVC. The heart size and mediastinal contours are within normal limits. Both lungs are clear. The visualized skeletal structures are unremarkable. IMPRESSION: No active cardiopulmonary disease. Electronically Signed   By: Tyron Gallon M.D.   On: 07/06/2023 18:35        Scheduled Meds:  acyclovir   400 mg Oral BID   enoxaparin  (LOVENOX ) injection  30 mg Subcutaneous Q24H   levothyroxine   50 mcg Oral Daily   losartan   25 mg Oral Daily   magnesium  oxide  200 mg Oral Daily   metoprolol  succinate  25 mg Oral Daily   mirtazapine   15 mg Oral QHS   olopatadine    1 drop Left Eye BID   posaconazole   300 mg Oral Daily   potassium chloride   20 mEq Oral BID   sodium chloride  flush  3 mL Intravenous Q12H   spironolactone   25 mg Oral Daily   Continuous Infusions:  sodium chloride      sodium chloride  75 mL/hr at 07/07/23 3086   ceFEPime  (MAXIPIME ) IV Stopped (07/07/23 0539)   vancomycin        LOS: 1 day    Time spent: 52 minutes    Vada Garibaldi, MD [facility]

## 2023-07-08 DIAGNOSIS — D709 Neutropenia, unspecified: Secondary | ICD-10-CM | POA: Diagnosis not present

## 2023-07-08 DIAGNOSIS — C9200 Acute myeloblastic leukemia, not having achieved remission: Secondary | ICD-10-CM

## 2023-07-08 DIAGNOSIS — Z7189 Other specified counseling: Secondary | ICD-10-CM | POA: Diagnosis not present

## 2023-07-08 DIAGNOSIS — Z515 Encounter for palliative care: Secondary | ICD-10-CM

## 2023-07-08 DIAGNOSIS — R531 Weakness: Secondary | ICD-10-CM

## 2023-07-08 DIAGNOSIS — R5081 Fever presenting with conditions classified elsewhere: Secondary | ICD-10-CM | POA: Diagnosis not present

## 2023-07-08 LAB — CBC WITH DIFFERENTIAL/PLATELET
Abs Immature Granulocytes: 0 K/uL (ref 0.00–0.07)
Basophils Absolute: 0 K/uL (ref 0.0–0.1)
Basophils Relative: 0 %
Eosinophils Absolute: 0 K/uL (ref 0.0–0.5)
Eosinophils Relative: 1 %
HCT: 29.3 % — ABNORMAL LOW (ref 36.0–46.0)
Hemoglobin: 9.4 g/dL — ABNORMAL LOW (ref 12.0–15.0)
Immature Granulocytes: 0 %
Lymphocytes Relative: 65 %
Lymphs Abs: 0.7 K/uL (ref 0.7–4.0)
MCH: 30 pg (ref 26.0–34.0)
MCHC: 32.1 g/dL (ref 30.0–36.0)
MCV: 93.6 fL (ref 80.0–100.0)
Monocytes Absolute: 0.2 K/uL (ref 0.1–1.0)
Monocytes Relative: 24 %
Neutro Abs: 0.1 K/uL — CL (ref 1.7–7.7)
Neutrophils Relative %: 10 %
Platelets: 85 K/uL — ABNORMAL LOW (ref 150–400)
RBC: 3.13 MIL/uL — ABNORMAL LOW (ref 3.87–5.11)
RDW: 18.5 % — ABNORMAL HIGH (ref 11.5–15.5)
WBC: 1 K/uL — CL (ref 4.0–10.5)
nRBC: 2 % — ABNORMAL HIGH (ref 0.0–0.2)

## 2023-07-08 LAB — COMPREHENSIVE METABOLIC PANEL WITH GFR
ALT: 50 U/L — ABNORMAL HIGH (ref 0–44)
AST: 43 U/L — ABNORMAL HIGH (ref 15–41)
Albumin: 2.5 g/dL — ABNORMAL LOW (ref 3.5–5.0)
Alkaline Phosphatase: 145 U/L — ABNORMAL HIGH (ref 38–126)
Anion gap: 9 (ref 5–15)
BUN: 17 mg/dL (ref 8–23)
CO2: 24 mmol/L (ref 22–32)
Calcium: 8.7 mg/dL — ABNORMAL LOW (ref 8.9–10.3)
Chloride: 101 mmol/L (ref 98–111)
Creatinine, Ser: 0.69 mg/dL (ref 0.44–1.00)
GFR, Estimated: >60 mL/min (ref >60)
Glucose, Bld: 102 mg/dL — ABNORMAL HIGH (ref 70–99)
Potassium: 4.3 mmol/L (ref 3.5–5.1)
Sodium: 134 mmol/L — ABNORMAL LOW (ref 135–145)
Total Bilirubin: 1.5 mg/dL — ABNORMAL HIGH (ref 0.0–1.2)
Total Protein: 6.4 g/dL — ABNORMAL LOW (ref 6.5–8.1)

## 2023-07-08 LAB — URINALYSIS, ROUTINE W REFLEX MICROSCOPIC
Bilirubin Urine: NEGATIVE
Glucose, UA: NEGATIVE mg/dL
Hgb urine dipstick: NEGATIVE
Ketones, ur: 5 mg/dL — AB
Leukocytes,Ua: NEGATIVE
Nitrite: NEGATIVE
Protein, ur: NEGATIVE mg/dL
Specific Gravity, Urine: 1.015 (ref 1.005–1.030)
pH: 5 (ref 5.0–8.0)

## 2023-07-08 LAB — MAGNESIUM: Magnesium: 1.8 mg/dL (ref 1.7–2.4)

## 2023-07-08 MED ORDER — SODIUM CHLORIDE 0.9% FLUSH: 10.0000 mL | INTRAVENOUS | Status: DC | PRN

## 2023-07-08 MED ORDER — SODIUM CHLORIDE 0.9% FLUSH
10.0000 mL | Freq: Two times a day (BID) | INTRAVENOUS | Status: DC
  Administered 2023-07-08 – 2023-07-09 (×2): 10 mL

## 2023-07-08 MED ORDER — MORPHINE SULFATE (CONCENTRATE) 10 MG /0.5 ML PO SOLN: 10.0000 mg | ORAL | Status: DC | PRN

## 2023-07-08 MED ORDER — CHLORHEXIDINE GLUCONATE CLOTH 2 % EX PADS
6.0000 | MEDICATED_PAD | Freq: Every day | CUTANEOUS | Status: DC
  Administered 2023-07-08 – 2023-07-09 (×2): 6 via TOPICAL

## 2023-07-08 NOTE — Plan of Care (Signed)

## 2023-07-08 NOTE — Progress Notes (Signed)
 Critical Lab Value  WBC: 1.0 Neutrophils: 0.1 Time Notified: 11:22 am Time Provider Notified: 11:24 am Provider Notified: Letty Raya, MD New Orders: None

## 2023-07-08 NOTE — Progress Notes (Signed)
 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  Patient is on supportive care, blood transfusions and platelet transfusion for her AML.  She has exhausted treatment.  She was seeing oncologist at [facility] then subsequently transferred back to [facility] just for continuing supportive cares.  She was seen at the cancer center with temperature 100.5, ANC 0.1 and hemoglobin 8.5.  Patient with chronic poor appetite.  She was sent to ER with oncology consultation.  Subjective:  Patient seen and examined.  She has burning, blurriness and irritation of the left eye and looks uncomfortable.  She is very anxious today.  Did not sleep well last night.  Agreeable to use some pain medications. Discussed with patient's sister on the phone.  Waiting for palliative care consultation.  Patient and sister is more or less agreeable to discuss about hospice.  Assessment & Plan:   Neutropenic fever associated with AML, severe neutropenia and pancytopenia: Patient was febrile with neutrophil count less than 0.1.  Blood cultures, pending negative so far. Respiratory virus panel negative. No focal source of infection. Chest x-ray essentially normal. Given severity of symptoms, will treat as neutropenic fever for at least next 24 hours.   Patient is not candidate for colony-stimulating factor.  Left eye abnormality: Patient has been followed by outpatient ophthalmology.  Her left eye is irritated and also has some corneal changes.  Apparently seen by ophthalmology last week and prescribed Patanol ophthalmic solutions that we will continue.  Pancytopenia: ANC 0.1, repeat labs pending  today. Hemoglobin 8.8 after 1 unit of transfusion.  Will hold off on further transfusion. Platelets 85, no indication to transfuse now. Oncology following.  Chronic systolic heart failure: Euvolemic currently on losartan  metoprolol  and potassium.    Hypertension: As above.  Hypothyroidism: On Synthroid .  Goal of care: Advanced disease, exhausted treatment.  Patient will benefit with palliative and hospice discussions.  Currently remains full code.  Palliative care team consulted. Will start patient on some morphine and Ativan for symptom relief.  DVT prophylaxis: enoxaparin  (LOVENOX ) injection 30 mg Start: 07/06/23 1800   Code Status: Full code Family Communication: Sister Arzella Laurence who is also healthcare power of attorney on the phone. Disposition Plan: Status is: Inpatient Remains inpatient appropriate because: IV antibiotics, severe systemic disease     Consultants:  Oncology Palliative care  Procedures:  None  Antimicrobials:  Vancomycin  and cefepime  5/30---     Objective: Vitals:   07/07/23 0115 07/07/23 1342 07/07/23 2055 07/08/23 0548  BP: 126/66 (!) 162/75 (!) 141/68 (!) 146/49  Pulse: (!) 105 66 (!) 110 61  Resp: 18 16 18 18   Temp: 98.1 F (36.7 C) 98.8 F (37.1 C) 98.8 F (37.1 C) 98.6 F (37 C)  TempSrc: Oral Oral Oral Oral  SpO2: 100% 100% 99% 100%  Weight:      Height:        Intake/Output Summary (Last 24 hours) at 07/08/2023 1031 Last data filed at 07/08/2023 0700 Gross per 24 hour  Intake 619.43 ml  Output 850 ml  Net -230.57 ml   Filed Weights   07/06/23 1312 07/06/23 1324  Weight: 43.4 kg 43.4 kg    Examination:  General: Frail and debilitated.  Chronically sick looking.  Anxious and tearful today. Cardiovascular: S1-S2 normal.  Regular rate rhythm. Respiratory: Bilateral clear.  No added sounds.  She has a port on the right chest wall. Gastrointestinal: Soft.  Nontender.  Bowel sound present. Ext: No swelling or edema.  No  cyanosis. Neuro: Alert awake.  No neurodeficits. Musculoskeletal: No deformities. Patient has corneal tear?  Could be hemorrhage.  She has conjunctival redness.  There is no periorbital tenderness.       Data Reviewed: I have personally reviewed following labs and imaging studies  CBC: Recent Labs  Lab 07/03/23 0806 07/06/23 0737 07/06/23 1501 07/07/23 0510  WBC 1.0* 1.0* 1.5* 1.2*  NEUTROABS 0.1* 0.1*  --   --   HGB 9.0* 8.5* 9.4* 8.8*  HCT 27.6* 26.2* 30.5* 27.2*  MCV 93.2 93.9 96.8 94.8  PLT 132* 130* 76* 85*   Basic Metabolic Panel: Recent Labs  Lab 07/03/23 0806 07/06/23 0737 07/06/23 1501 07/07/23 0510  NA 137 138  --  138  K 3.5 3.9  --  4.0  CL 101 102  --  105  CO2 27 29  --  24  GLUCOSE 125* 117*  --  123*  BUN 20 29*  --  24*  CREATININE 0.73 0.76 0.84 0.70  CALCIUM 9.0 9.1  --  8.2*  MG 1.7 1.9  --   --    GFR: Estimated Creatinine Clearance: 38.4 mL/min (by C-G formula based on SCr of 0.7 mg/dL). Liver Function Tests: Recent Labs  Lab 07/03/23 0806 07/06/23 0737  AST 33 32  ALT 29 35  ALKPHOS 125 131*  BILITOT 1.3* 1.3*  PROT 6.8 7.0  ALBUMIN 3.2* 3.3*   No results for input(s): "LIPASE", "AMYLASE" in the last 168 hours. No results for input(s): "AMMONIA" in the last 168 hours. Coagulation Profile: No results for input(s): "INR", "PROTIME" in the last 168 hours. Cardiac Enzymes: No results for input(s): "CKTOTAL", "CKMB", "CKMBINDEX", "TROPONINI" in the last 168 hours. BNP (last 3 results) No results for input(s): "PROBNP" in the last 8760 hours. HbA1C: No results for input(s): "HGBA1C" in the last 72 hours. CBG: No results for input(s): "GLUCAP" in the last 168 hours. Lipid Profile: No results for input(s): "CHOL", "HDL", "LDLCALC", "TRIG", "CHOLHDL", "LDLDIRECT" in the last 72 hours. Thyroid  Function Tests: No results for input(s): "TSH", "T4TOTAL", "FREET4", "T3FREE", "THYROIDAB" in the last 72 hours. Anemia Panel: No results  for input(s): "VITAMINB12", "FOLATE", "FERRITIN", "TIBC", "IRON", "RETICCTPCT" in the last 72 hours. Sepsis Labs: No results for input(s): "PROCALCITON", "LATICACIDVEN" in the last 168 hours.  Recent Results (from the past 240 hours)  Respiratory (~20 pathogens) panel by PCR     Status: None   Collection Time: 07/06/23  1:44 PM   Specimen: Nasopharyngeal Swab; Respiratory  Result Value Ref Range Status   Adenovirus NOT DETECTED NOT DETECTED Final   Coronavirus 229E NOT DETECTED NOT DETECTED Final    Comment: (NOTE) The Coronavirus on the Respiratory Panel, DOES NOT test for the novel  Coronavirus (2019 nCoV)    Coronavirus HKU1 NOT DETECTED NOT DETECTED Final   Coronavirus NL63 NOT DETECTED NOT DETECTED Final   Coronavirus OC43 NOT DETECTED NOT DETECTED Final   Metapneumovirus NOT DETECTED NOT DETECTED Final   Rhinovirus / Enterovirus NOT DETECTED NOT DETECTED Final   Influenza A NOT DETECTED NOT DETECTED Final   Influenza B NOT DETECTED NOT DETECTED Final   Parainfluenza Virus 1 NOT DETECTED NOT DETECTED Final   Parainfluenza Virus 2 NOT  DETECTED NOT DETECTED Final   Parainfluenza Virus 3 NOT DETECTED NOT DETECTED Final   Parainfluenza Virus 4 NOT DETECTED NOT DETECTED Final   Respiratory Syncytial Virus NOT DETECTED NOT DETECTED Final   Bordetella pertussis NOT DETECTED NOT DETECTED Final   Bordetella Parapertussis NOT DETECTED NOT DETECTED Final   Chlamydophila pneumoniae NOT DETECTED NOT DETECTED Final   Mycoplasma pneumoniae NOT DETECTED NOT DETECTED Final    Comment: Performed at [facility], [address] [place], [place] [address]  Culture, blood (Routine X 2) w Reflex to ID Panel     Status: None (Preliminary result)   Collection Time: 07/06/23  3:01 PM   Specimen: BLOOD LEFT ARM  Result Value Ref Range Status   Specimen Description   Final    BLOOD LEFT ARM Performed at [facility], [address], [place], [place] [address]    Special Requests    Final    BOTTLES DRAWN AEROBIC AND ANAEROBIC Blood Culture adequate volume Performed at [facility], [address], [place], [place] [address]    Culture   Final    NO GROWTH 2 DAYS Performed at [facility], [address], [place], [place] [address]    Report Status PENDING  Incomplete  Culture, blood (Routine X 2) w Reflex to ID Panel     Status: None (Preliminary result)   Collection Time: 07/06/23  3:01 PM   Specimen: BLOOD  Result Value Ref Range Status   Specimen Description   Final    BLOOD Performed at [facility], [address] [place], [place] [address]    Special Requests   Final    Immunocompromised Performed at [facility], [address] [place], [place] [address]    Culture   Final    NO GROWTH 2 DAYS Performed at [facility], [address], [place], [place] [address]    Report Status PENDING  Incomplete         Radiology Studies: DG Chest 2 View Result Date: 07/06/2023 CLINICAL DATA:  Neutropenic fevers EXAM: CHEST - 2 VIEW COMPARISON:  Chest x-ray 05/10/2023 skip FINDINGS: Right chest port catheter tip ends in the SVC. The heart size and mediastinal contours are within normal limits. Both lungs are clear. The visualized skeletal structures are unremarkable. IMPRESSION: No active cardiopulmonary disease. Electronically Signed   By: Tyron Gallon M.D.   On: 07/06/2023 18:35        Scheduled Meds:  acyclovir   400 mg Oral BID   enoxaparin  (LOVENOX ) injection  30 mg Subcutaneous Q24H   levothyroxine   50 mcg Oral Daily   losartan   25 mg Oral Daily   magnesium  oxide  200 mg Oral Daily   metoprolol  succinate  25 mg Oral Daily   mirtazapine   15 mg Oral QHS   olopatadine   1 drop Left Eye BID   posaconazole   300 mg Oral Daily   potassium chloride   20 mEq Oral BID   sodium chloride  flush  3 mL Intravenous Q12H   spironolactone   25 mg Oral Daily   Continuous Infusions:  ceFEPime   (MAXIPIME ) IV 2 g (07/08/23 0556)   vancomycin  Stopped (07/07/23 1442)     LOS: 2 days    Time spent: 52 minutes    Vada Garibaldi, MD [facility]

## 2023-07-08 NOTE — Consult Note (Signed)
 Palliative Care Consult Note                                  Date: 07/08/2023   Patient Name: [name]  DOB: [birth year]  MRN: [id number]  Age / Sex: 83 y.o., female  PCP: Donnie Galea, MD Referring Physician: Vada Garibaldi, MD  Reason for Consultation: Establishing goals of care  HPI/Patient Profile: Palliative Care consult requested for goals of care discussion in this 83 y.o. 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 She was admitted on 07/06/2023 from the cancer center with temperature of 100.5, increased weakness, hemoglobin 8.5, poor appetite, and ANC 0.1.    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 Khanna to discuss diagnosis, prognosis, GOC, EOL wishes disposition and options. Patient is resting but easily awakened. Alert and able to engage in discussions.    Concept of Palliative Care was introduced as specialized medical care for people and their families living with serious illness.  It focuses on providing relief from the symptoms and stress of a serious illness.  The goal is to improve quality of life for both the patient and the family. Values and goals of care important to patient and family were attempted to be elicited.  I created space and opportunity  for Ms.Wiegert to explore state of health prior to admission, thoughts, and feelings.   Patient shares that she lives alone prior to admission. She has a Manufacturing systems engineer" who she loves dearly. 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 Her sister Arzella Laurence is involved in her care.   Prior to admission patient felt she was "doing ok" but acknowledges that her health is not improving. States she is "declining". When I offer space to elaborate she mentions "my appetite is not good, I am constantly tired, I have no energy to do anything!" Emotional support provided.   We discussed Her current illness and what it means in the larger context of Her on-going co-morbidities. Natural disease trajectory and expectations were discussed.  Ms. Crites verbalized understanding of current illness and co-morbidities. She is realistic in her understanding of poor overall prognosis. I shared concerns of her returning home alone knowing she requires assistance with some ADLs recently and given health decline. She is emotional expressing understanding stating she does not want to leave her sister or dog. Emotional support provided.   We discussed her expressed feelings clarifying patient is referencing end-of-life and death. She states she knows that both her dog and sister will miss her dearly. I acknowledge her thoughts also sharing that her sister specifically does not wish for her to suffer or be in a debilitating state.   I empathetically  approached discussions regarding code status and advanced directives. Ms. Gane reports she does not have documents however her sister Arzella Laurence would be her medical decision maker in the event she could not speak for herself. Although Ms. Jorstad speaks to her desire for a natural death and no life sustaining measures understanding her poor prognosis it is hard for her to agree to DNR/DNI as it feels she is "giving up". I shared with patient making such an important  medical decision to allow a natural death to prevent trauma and further suffering is not giving up but a brave decision understanding this allows family to know her wishes before the emergent event occurs. She expressed understanding and appreciation. She is leaning towards DNR/DNI and request to discuss further with her sister today and make final decision. I offered to contact sister however patient would like to have a personal discussions. Support provided.   At this time Ms. Creswell is clear in her expressed wishes to continue to treat the treatable while having discussions with her sister. She speaks to continued supportive care at discharge. If patient does not discharge home with hospice support I will plan to follow-up with her at the cancer center.   I discussed the importance of continued conversation with family and their medical providers regarding overall plan of care and treatment options, ensuring decisions are within the context of the patients values and GOCs.  Questions and concerns were addressed.  The patient and family was encouraged to call with questions or concerns.  PMT will continue to support holistically as needed.  Objective:   Primary Diagnoses: Present on Admission:  Neutropenic fever (HCC)  AML (acute myelogenous leukemia) (HCC)  Hypothyroidism  HTN (hypertension)  Heart failure with reduced ejection fraction (HFrEF, <= 40%) (HCC)   Scheduled Meds:  acyclovir   400 mg Oral BID   enoxaparin  (LOVENOX ) injection  30 mg Subcutaneous Q24H   levothyroxine   50 mcg Oral Daily   losartan   25 mg Oral Daily   magnesium  oxide  200 mg Oral Daily   metoprolol  succinate  25 mg Oral Daily   mirtazapine   15 mg Oral QHS   olopatadine   1 drop Left Eye BID   posaconazole   300 mg Oral Daily   potassium chloride   20 mEq Oral BID   sodium chloride  flush  3 mL Intravenous Q12H   spironolactone   25 mg Oral Daily    Continuous Infusions:  ceFEPime  (MAXIPIME ) IV 2 g (07/08/23  0556)   vancomycin  Stopped (07/07/23 1442)    PRN Meds: acetaminophen  **OR** acetaminophen , ALPRAZolam , morphine CONCENTRATE, ondansetron  **OR** ondansetron  (ZOFRAN ) IV, polyethylene glycol, sodium chloride  flush, traZODone   Allergies  Allergen Reactions   Ambien [Zolpidem] Other (See Comments)    "Intolerant"   Codeine Nausea And Vomiting   Dust Mite Extract Cough   Grass Pollen(K-O-R-T-Swt Vern) Cough   Hydrocodone Nausea Only   Hydrocodone-Acetaminophen  Nausea Only   Statins Other (See Comments)    Muscle pain   Sulfa Antibiotics Nausea And Vomiting   Zofran  [Ondansetron ] Anxiety and Other (See Comments)    Increase in anxiety and doesn't help with nausea    Review of Systems  Constitutional:  Positive for activity change, appetite change and fatigue.  Unless otherwise noted, a complete review of systems is negative.  Physical Exam General: NAD, frail chronically-ill appearing Cardiovascular: regular rate and rhythm Pulmonary: diminished bilaterally  Abdomen: soft, nontender, + bowel sounds Extremities: no edema, no joint deformities Skin: no rashes, warm and dry Neurological: AAO  x3  Vital Signs:  BP (!) 146/49 (BP Location: Left Arm)   Pulse 61   Temp 98.6 F (37 C) (Oral)   Resp 18   Ht 5\' 4"  (1.626 m)   Wt 43.4 kg   LMP 01/19/1992 (Exact Date)   SpO2 100%   BMI 16.42 kg/m  Pain Scale: 0-10 POSS *See Group Information*: 1-Acceptable,Awake and alert Pain Score: 0-No pain  SpO2: SpO2: 100 % O2 Device:SpO2: 100 % O2 Flow Rate: .   IO: Intake/output summary:  Intake/Output Summary (Last 24 hours) at 07/08/2023 1156 Last data filed at 07/08/2023 0700 Gross per 24 hour  Intake 379.43 ml  Output 850 ml  Net -470.57 ml    LBM: Last BM Date : 07/06/23 Baseline Weight: Weight: 43.4 kg Most recent weight: Weight: 43.4 kg      Palliative Assessment/Data: PPS 40%   Advanced Care Planning:   Primary Decision Maker: PATIENT  Code Status/Advance Care  Planning: Full code  A discussion was had today regarding advanced directives. Concepts specific to code status, artifical feeding and hydration, continued IV antibiotics and rehospitalization was had.  The difference between a aggressive medical intervention path and a palliative comfort care path was discussed.   Hospice and Palliative Care services outpatient were explained and offered. Patient and family verbalized their understanding and awareness of both palliative and hospice's goals and philosophy of care.   Assessment & Plan:   SUMMARY OF RECOMMENDATIONS   /Full Code-as confirmed by patient. She is leaning towards DNR/DNI. Speaks to her desire for natural death however with limitations to agree on DNR feelings she is "giving up on life". Wish to speak with her sister.  Continue with current plan of care. Clear in discussions to continue to treat the treatable while having discussions with her sister. She speaks to continued supportive care at the cancer upon discharge as discussed with her Oncologist.  Education provided and hospice recommended given poor prognosis and ongoing decline in quality of life. Patient declines at this time but open to continued discussions.  Patient has agreed to discharge home with her sister. Aware she is no longer able to live alone.  PMT will continue to support and follow.  Please call team line or secure chat covering provider with urgent unmet palliative needs.  Symptom Management:  Per Attending  Palliative Prophylaxis:  Aspiration, Bowel Regimen, Delirium Protocol, Frequent Pain Assessment, and Palliative Wound Care  Additional Recommendations (Limitations, Scope, Preferences): Full Scope Treatment  Psycho-social/Spiritual:  Desire for further Chaplaincy support: no Additional Recommendations: Education on Hospice  Prognosis:  < 6 months  Discharge Planning:  To Be Determined Home with sister. Outpatient Palliative at minimum. I will  follow-up with patient at the Cancer Center   Patient expressed understanding and was in agreement with this plan.    Time Total: 55 min  Visit consisted of counseling and education dealing with the complex and emotionally intense issues of symptom management and palliative care in the setting of serious and potentially life-threatening illness.  Signed by:  Dellia Ferguson, AGPCNP-BC [facility]   Phone: [phone] Pager: [phone] Amion: Cori Dials   Thank you for allowing the Palliative Medicine Team to assist in the care of this patient. Please utilize secure chat with additional questions, if there is no response within 30 minutes please call the above phone number. Palliative Medicine Team providers are available by phone from 7am to 5pm daily and can be reached through the team cell phone.  Should this patient require assistance outside of these hours, please call the patient's attending physician.

## 2023-07-09 ENCOUNTER — Inpatient Hospital Stay

## 2023-07-09 DIAGNOSIS — Z7189 Other specified counseling: Secondary | ICD-10-CM | POA: Diagnosis not present

## 2023-07-09 DIAGNOSIS — R5081 Fever presenting with conditions classified elsewhere: Secondary | ICD-10-CM | POA: Diagnosis not present

## 2023-07-09 DIAGNOSIS — C9200 Acute myeloblastic leukemia, not having achieved remission: Secondary | ICD-10-CM | POA: Diagnosis not present

## 2023-07-09 DIAGNOSIS — D709 Neutropenia, unspecified: Secondary | ICD-10-CM | POA: Diagnosis not present

## 2023-07-09 DIAGNOSIS — Z515 Encounter for palliative care: Secondary | ICD-10-CM | POA: Diagnosis not present

## 2023-07-09 LAB — CBC WITH DIFFERENTIAL/PLATELET
Abs Immature Granulocytes: 0.03 10*3/uL (ref 0.00–0.07)
Basophils Absolute: 0 10*3/uL (ref 0.0–0.1)
Basophils Relative: 0 %
Eosinophils Absolute: 0 10*3/uL (ref 0.0–0.5)
Eosinophils Relative: 0 %
HCT: 29.2 % — ABNORMAL LOW (ref 36.0–46.0)
Hemoglobin: 9.1 g/dL — ABNORMAL LOW (ref 12.0–15.0)
Immature Granulocytes: 3 %
Lymphocytes Relative: 65 %
Lymphs Abs: 0.7 10*3/uL (ref 0.7–4.0)
MCH: 30.1 pg (ref 26.0–34.0)
MCHC: 31.2 g/dL (ref 30.0–36.0)
MCV: 96.7 fL (ref 80.0–100.0)
Monocytes Absolute: 0.3 10*3/uL (ref 0.1–1.0)
Monocytes Relative: 26 %
Neutro Abs: 0.1 10*3/uL — CL (ref 1.7–7.7)
Neutrophils Relative %: 6 %
Platelets: 81 10*3/uL — ABNORMAL LOW (ref 150–400)
RBC: 3.02 MIL/uL — ABNORMAL LOW (ref 3.87–5.11)
RDW: 18.4 % — ABNORMAL HIGH (ref 11.5–15.5)
WBC: 1 10*3/uL — CL (ref 4.0–10.5)
nRBC: 0 % (ref 0.0–0.2)

## 2023-07-09 LAB — TYPE AND SCREEN
ABO/RH(D): A POS
Antibody Screen: NEGATIVE
Unit division: 0

## 2023-07-09 LAB — BPAM RBC
Blood Product Expiration Date: 202506172359
ISSUE DATE / TIME: 202505301014
Unit Type and Rh: 6200

## 2023-07-09 MED ORDER — HYDROMORPHONE HCL 1 MG/ML IJ SOLN: 0.5000 mg | INTRAMUSCULAR | Status: DC | PRN

## 2023-07-09 MED ORDER — LORAZEPAM 1 MG PO TABS: 1.0000 mg | ORAL_TABLET | ORAL | Status: DC | PRN

## 2023-07-09 MED ORDER — HALOPERIDOL LACTATE 5 MG/ML IJ SOLN: 0.5000 mg | INTRAMUSCULAR | Status: DC | PRN

## 2023-07-09 MED ORDER — POLYVINYL ALCOHOL 1.4 % OP SOLN: 1.0000 [drp] | Freq: Four times a day (QID) | OPHTHALMIC | Status: DC | PRN

## 2023-07-09 MED ORDER — GLYCOPYRROLATE 0.2 MG/ML IJ SOLN
0.2000 mg | INTRAMUSCULAR | Status: DC | PRN
Start: 2023-07-09 — End: 2023-07-10

## 2023-07-09 MED ORDER — ACETAMINOPHEN 650 MG RE SUPP: 650.0000 mg | Freq: Four times a day (QID) | RECTAL | Status: DC | PRN

## 2023-07-09 MED ORDER — GLYCOPYRROLATE 1 MG PO TABS: 1.0000 mg | ORAL_TABLET | ORAL | Status: DC | PRN

## 2023-07-09 MED ORDER — PROCHLORPERAZINE MALEATE 10 MG PO TABS
5.0000 mg | ORAL_TABLET | Freq: Four times a day (QID) | ORAL | Status: DC | PRN
Start: 2023-07-09 — End: 2023-07-09

## 2023-07-09 MED ORDER — PROCHLORPERAZINE EDISYLATE 10 MG/2ML IJ SOLN: 10.0000 mg | Freq: Two times a day (BID) | INTRAMUSCULAR | Status: DC | PRN

## 2023-07-09 MED ORDER — PROCHLORPERAZINE 25 MG RE SUPP: 25.0000 mg | Freq: Two times a day (BID) | RECTAL | Status: DC | PRN

## 2023-07-09 MED ORDER — LORAZEPAM 2 MG/ML PO CONC
1.0000 mg | ORAL | Status: DC | PRN
Start: 2023-07-09 — End: 2023-07-10

## 2023-07-09 MED ORDER — PREDNISOLONE ACETATE 1 % OP SUSP
1.0000 [drp] | Freq: Four times a day (QID) | OPHTHALMIC | Status: DC
  Administered 2023-07-09 – 2023-07-10 (×3): 1 [drp] via OPHTHALMIC
  Filled 2023-07-09: qty 5

## 2023-07-09 MED ORDER — PREDNISOLONE ACETATE 1 % OP SUSP: 1.0000 [drp] | OPHTHALMIC | Status: DC

## 2023-07-09 MED ORDER — HALOPERIDOL 0.5 MG PO TABS
0.5000 mg | ORAL_TABLET | ORAL | Status: DC | PRN
Start: 2023-07-09 — End: 2023-07-09

## 2023-07-09 MED ORDER — GLYCOPYRROLATE 0.2 MG/ML IJ SOLN: 0.2000 mg | INTRAMUSCULAR | Status: DC | PRN

## 2023-07-09 MED ORDER — ACETAMINOPHEN 325 MG PO TABS: 650.0000 mg | ORAL_TABLET | Freq: Four times a day (QID) | ORAL | Status: DC | PRN

## 2023-07-09 MED ORDER — ACETAMINOPHEN 500 MG PO TABS
1000.0000 mg | ORAL_TABLET | Freq: Four times a day (QID) | ORAL | Status: DC | PRN
  Administered 2023-07-09 – 2023-07-10 (×2): 1000 mg via ORAL
  Filled 2023-07-09 (×2): qty 2

## 2023-07-09 MED ORDER — LORAZEPAM 2 MG/ML IJ SOLN: 1.0000 mg | INTRAMUSCULAR | Status: DC | PRN

## 2023-07-09 MED ORDER — HALOPERIDOL LACTATE 2 MG/ML PO CONC: 0.5000 mg | ORAL | Status: DC | PRN

## 2023-07-09 MED ORDER — LORAZEPAM 2 MG/ML IJ SOLN
0.5000 mg | Freq: Once | INTRAMUSCULAR | Status: AC
  Administered 2023-07-09: 0.5 mg via INTRAVENOUS
  Filled 2023-07-09: qty 1

## 2023-07-09 NOTE — Progress Notes (Signed)
 Full note to follow , changing to comfort care and referring to [facility] place.

## 2023-07-09 NOTE — Progress Notes (Signed)
 Aaron Aas  Daily Progress Note   Patient Name: [name]       Date: 07/09/2023 DOB: 18-Jun-1940  Age: 83 y.o. MRN#: [id number] Attending Physician: Vada Garibaldi, MD Primary Care Physician: Donnie Galea, MD Admit Date: 07/06/2023  Reason for Consultation/Follow-up: Establishing goals of care and Pain control  Subjective: Chart Reviewed. Updates Received. Patient Assessed. Patient is minimally responsive during visit however assisted RN to reposition and patient aroused. Remains lethargic. Became dyspneic, tachycardic, and diaphoretic with movement. Restlessness noted. Sister is at bedside. RN at bedside to administer ativan.   Patient is showing signs of end-of-life. Awake and alert able to engage in discussions days prior. Some mottling to lower extremity noted.   Sister, [name] is at the bedside and in agreement with comfort focused care and transfer to [facility] to spend what time she has left. This is an appropriate plan for patient however at this time given symptom burden and changes with repositioning in bed I expressed concerns for transporting patient to [facility] today. Sister verbalized understanding. She is aware we will continue to evaluate and if stable for transfer can consider over the next 24 hours.   [name] would like to bring patient's dog, [name] to visit as this was one of the most important pieces to her life. Emotional support provided and will discuss with unit leadership to approve.   All questions answered and support provided.    Length of Stay: 3 days  Vital Signs: BP (!) 148/57   Pulse 60   Temp 98 F (36.7 C) (Oral)   Resp 18   Ht 5\' 4"  (1.626 m)   Wt 95 lb 10.9 oz (43.4 kg)   LMP 01/19/1992 (Exact Date)   SpO2 100%   BMI 16.42 kg/m  SpO2: SpO2: 100 % O2 Device: O2 Device: Room Air O2 Flow Rate:   Last Weight  Most recent update: 07/06/2023  1:24 PM    Weight  43.4 kg (95 lb 10.9 oz)             Intake/Output Summary (Last 24 hours)  at 07/09/2023 1242 Last data filed at 07/08/2023 2149 Gross per 24 hour  Intake 3 ml  Output 400 ml  Net -397 ml    Physical Exam: Gen:  Restless HEENT: dry CV: Irregular, tachycardic PULM: coarse, dyspneic ABD: soft/nontender/nondistended/normal bowel sounds EXT: No edema, mottling  Neuro: lethargic  Palliative Care Assessment & Plan  HPI:  Palliative Care consult requested for goals of care discussion in this 83 y.o. 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 She was admitted on 07/06/2023 from the cancer center with temperature of 100.5, increased weakness, hemoglobin 8.5, poor appetite, and ANC 0.1.    Code Status: DNR  Recommendations/Plan: DNR/DNI Transition to full comfort care Anticipated hospital death versus transfer to [facility] once stable for transfer PMT will continue to support and follow on as needed basis. Please secure chat for urgent needs.   Symptom Management: Comfort Measures  Morphine PRN for pain/air hunger/comfort Robinul PRN for excessive secretions Ativan PRN for agitation/anxiety Zofran  PRN for nausea Liquifilm tears PRN for dry eyes Haldol PRN for agitation/anxiety May have comfort feeding Comfort cart for family Unrestricted visitations in the setting of EOL (per  policy) Oxygen PRN 2L or less for comfort. No escalation.    Thank you for allowing the Palliative Medicine Team to assist in the care of this patient.  Palliative Medicine Team providers are available by phone from 7am to 7pm daily and can be reached through the team cell phone. Should this patient require assistance outside of these hours, please call the patient's attending physician.  Any  controlled substances utilized were prescribed in the context of palliative care. PDMP has been reviewed.  Visit consisted of counseling and education dealing with the complex and emotionally intense issues of symptom management and palliative care in the setting of serious and potentially life-threatening illness.  Dellia Ferguson, AGPCNP-BC  [facility]  [phone]

## 2023-07-09 NOTE — Plan of Care (Signed)
  Problem: Education: Goal: Knowledge of General Education information will improve Description: Including pain rating scale, medication(s)/side effects and non-pharmacologic comfort measures Outcome: Not Progressing   Problem: Health Behavior/Discharge Planning: Goal: Ability to manage health-related needs will improve Outcome: Not Progressing   Problem: Clinical Measurements: Goal: Ability to maintain clinical measurements within normal limits will improve Outcome: Not Progressing Goal: Will remain free from infection Outcome: Not Progressing Goal: Diagnostic test results will improve Outcome: Not Progressing Goal: Respiratory complications will improve Outcome: Not Progressing Goal: Cardiovascular complication will be avoided Outcome: Not Progressing   Problem: Activity: Goal: Risk for activity intolerance will decrease Outcome: Not Progressing   Problem: Nutrition: Goal: Adequate nutrition will be maintained Outcome: Not Progressing   Problem: Coping: Goal: Level of anxiety will decrease Outcome: Not Progressing   Problem: Elimination: Goal: Will not experience complications related to bowel motility Outcome: Not Progressing Goal: Will not experience complications related to urinary retention Outcome: Not Progressing   Problem: Pain Managment: Goal: General experience of comfort will improve and/or be controlled Outcome: Not Progressing   Problem: Safety: Goal: Ability to remain free from injury will improve Outcome: Not Progressing   Problem: Skin Integrity: Goal: Risk for impaired skin integrity will decrease Outcome: Not Progressing   Problem: Education: Goal: Knowledge of the prescribed therapeutic regimen will improve Outcome: Not Progressing   Problem: Coping: Goal: Ability to identify and develop effective coping behavior will improve Outcome: Not Progressing   Problem: Clinical Measurements: Goal: Quality of life will improve Outcome: Not  Progressing   Problem: Respiratory: Goal: Verbalizations of increased ease of respirations will increase Outcome: Not Progressing   Problem: Role Relationship: Goal: Family's ability to cope with current situation will improve Outcome: Not Progressing Goal: Ability to verbalize concerns, feelings, and thoughts to partner or family member will improve Outcome: Not Progressing   Problem: Pain Management: Goal: Satisfaction with pain management regimen will improve Outcome: Not Progressing

## 2023-07-09 NOTE — Progress Notes (Signed)
 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  Patient is on supportive care, blood transfusions and platelet transfusion for her AML.  She has exhausted treatment.  She was seeing oncologist at [facility] then subsequently transferred back to [facility] just for continuing supportive cares.  She was seen at the cancer center with temperature 100.5, ANC 0.1 and hemoglobin 8.5.  Patient with chronic poor appetite.  She was sent to ER with oncology consultation.  Subjective:  Patient seen and examined.  Her sister was at the bedside.  Today patient is profoundly lethargic, looks uncomfortable on strong stimulation and almost unresponsive.  Discussed in detail with patient's sister who is also healthcare power of attorney at bedside.  Patient has exhausted treatment and now reaching end-of-life.  Discussed about initiating comfort care measures and hospice referral and sister agreed.  See goal of care discussion below.  Assessment & Plan:   Neutropenic fever associated with AML, severe neutropenia and pancytopenia: Not improving despite treatment.  Cultures negative. Severe pancytopenia Chronic systolic heart failure Failure to thrive now with obtundation.   Interdisciplinary Goals of Care Family Meeting   Date carried out: 07/09/2023  Location of the meeting: Bedside  Member's involved: Physician, Bedside Registered Nurse, and Family Member or next of kin  Durable Power of Attorney or acting medical decision maker: Claudina Cullen , Sister   Discussion: We discussed goals of care for [facility] .  Patient was diagnosed with AML about a year ago and has been  maintained on blood transfusion so far.  This time she has severe symptoms, she is more lethargic and obtunded.  She has irreversible medical conditions and terminal illness.  Currently not doing well, unable to eat and unable to keep up conversation.  Patient had always desired dignified death and expressed her wishes to her sister.  Given her end-of-life status, comfort care and hospice was recommended and sister agreed for that. Patient lives at home by herself..  Sister was trying to help her to move in with her.  Given current severe condition, not likely patient will be able to be taken care of at home.  Pursuing referral to inpatient hospice.  Code status:   Code Status: Do not attempt resuscitation (DNR) - Comfort care   Disposition: In-patient comfort care  Time spent for the meeting: 50 minutes       DVT prophylaxis: Comfort care   Code Status: Comfort care Family Communication: Sister Arzella Laurence who is also healthcare power of attorney at the bedside. Disposition Plan: Status is: Inpatient Remains inpatient appropriate because: Providing end-of-life care.     Consultants:  Oncology Palliative care  Procedures:  None  Antimicrobials:  Vancomycin  and cefepime  5/30--- 6/2.     Objective: Vitals:   07/08/23 2032 07/09/23 0529 07/09/23 0940 07/09/23 1254  BP: (!) 149/75 107/63 (!) 148/57 (!) 142/55  Pulse: (!) 52 66 60 (!) 120  Resp: 18 18  16   Temp: (!) 100.8 F (38.2 C) 98 F (36.7 C)  98 F (36.7 C)  TempSrc: Oral Oral  Oral  SpO2: 99% 100%  99%  Weight:      Height:        Intake/Output  Summary (Last 24 hours) at 07/09/2023 1324 Last data filed at 07/08/2023 2149 Gross per 24 hour  Intake 3 ml  Output 400 ml  Net -397 ml   Filed Weights   07/06/23 1312 07/06/23 1324  Weight: 43.4 kg 43.4 kg    Examination:  General: Lethargic and almost obtunded.  Uncomfortable on attempted awakening. Cardiovascular: S1-S2 normal.  Regular rate rhythm.  Patient has a  port on the right chest wall. Respiratory: Bilateral clear.  No added sounds.  Patient on room air. Gastrointestinal: Soft.  Nontender.  Bowel sounds present. Ext: No swelling or edema.  No cyanosis. Neuro: Obtunded.  Lethargic.  Unable to follow commands.      Data Reviewed: I have personally reviewed following labs and imaging studies  CBC: Recent Labs  Lab 07/03/23 0806 07/06/23 0737 07/06/23 1501 07/07/23 0510 07/08/23 1033 07/09/23 1004  WBC 1.0* 1.0* 1.5* 1.2* 1.0* 1.0*  NEUTROABS 0.1* 0.1*  --   --  0.1* 0.1*  HGB 9.0* 8.5* 9.4* 8.8* 9.4* 9.1*  HCT 27.6* 26.2* 30.5* 27.2* 29.3* 29.2*  MCV 93.2 93.9 96.8 94.8 93.6 96.7  PLT 132* 130* 76* 85* 85* 81*   Basic Metabolic Panel: Recent Labs  Lab 07/03/23 0806 07/06/23 0737 07/06/23 1501 07/07/23 0510 07/08/23 1033  NA 137 138  --  138 134*  K 3.5 3.9  --  4.0 4.3  CL 101 102  --  105 101  CO2 27 29  --  24 24  GLUCOSE 125* 117*  --  123* 102*  BUN 20 29*  --  24* 17  CREATININE 0.73 0.76 0.84 0.70 0.69  CALCIUM 9.0 9.1  --  8.2* 8.7*  MG 1.7 1.9  --   --  1.8   GFR: Estimated Creatinine Clearance: 38.4 mL/min (by C-G formula based on SCr of 0.69 mg/dL). Liver Function Tests: Recent Labs  Lab 07/03/23 0806 07/06/23 0737 07/08/23 1033  AST 33 32 43*  ALT 29 35 50*  ALKPHOS 125 131* 145*  BILITOT 1.3* 1.3* 1.5*  PROT 6.8 7.0 6.4*  ALBUMIN 3.2* 3.3* 2.5*   No results for input(s): "LIPASE", "AMYLASE" in the last 168 hours. No results for input(s): "AMMONIA" in the last 168 hours. Coagulation Profile: No results for input(s): "INR", "PROTIME" in the last 168 hours. Cardiac Enzymes: No results for input(s): "CKTOTAL", "CKMB", "CKMBINDEX", "TROPONINI" in the last 168 hours. BNP (last 3 results) No results for input(s): "PROBNP" in the last 8760 hours. HbA1C: No results for input(s): "HGBA1C" in the last 72 hours. CBG: No results for input(s): "GLUCAP" in the last 168 hours. Lipid Profile: No results  for input(s): "CHOL", "HDL", "LDLCALC", "TRIG", "CHOLHDL", "LDLDIRECT" in the last 72 hours. Thyroid  Function Tests: No results for input(s): "TSH", "T4TOTAL", "FREET4", "T3FREE", "THYROIDAB" in the last 72 hours. Anemia Panel: No results for input(s): "VITAMINB12", "FOLATE", "FERRITIN", "TIBC", "IRON", "RETICCTPCT" in the last 72 hours. Sepsis Labs: No results for input(s): "PROCALCITON", "LATICACIDVEN" in the last 168 hours.  Recent Results (from the past 240 hours)  Respiratory (~20 pathogens) panel by PCR     Status: None   Collection Time: 07/06/23  1:44 PM   Specimen: Nasopharyngeal Swab; Respiratory  Result Value Ref Range Status   Adenovirus NOT DETECTED NOT DETECTED Final   Coronavirus 229E NOT DETECTED NOT DETECTED Final    Comment: (NOTE) The Coronavirus on the Respiratory Panel, DOES NOT test for the novel  Coronavirus (2019 nCoV)    Coronavirus HKU1 NOT DETECTED NOT  DETECTED Final   Coronavirus NL63 NOT DETECTED NOT DETECTED Final   Coronavirus OC43 NOT DETECTED NOT DETECTED Final   Metapneumovirus NOT DETECTED NOT DETECTED Final   Rhinovirus / Enterovirus NOT DETECTED NOT DETECTED Final   Influenza A NOT DETECTED NOT DETECTED Final   Influenza B NOT DETECTED NOT DETECTED Final   Parainfluenza Virus 1 NOT DETECTED NOT DETECTED Final   Parainfluenza Virus 2 NOT DETECTED NOT DETECTED Final   Parainfluenza Virus 3 NOT DETECTED NOT DETECTED Final   Parainfluenza Virus 4 NOT DETECTED NOT DETECTED Final   Respiratory Syncytial Virus NOT DETECTED NOT DETECTED Final   Bordetella pertussis NOT DETECTED NOT DETECTED Final   Bordetella Parapertussis NOT DETECTED NOT DETECTED Final   Chlamydophila pneumoniae NOT DETECTED NOT DETECTED Final   Mycoplasma pneumoniae NOT DETECTED NOT DETECTED Final    Comment: Performed at [facility], [address], [place], [place] [address]  Culture, blood (Routine X 2) w Reflex to ID Panel     Status: None (Preliminary result)    Collection Time: 07/06/23  3:01 PM   Specimen: BLOOD LEFT ARM  Result Value Ref Range Status   Specimen Description   Final    BLOOD LEFT ARM Performed at [facility], [address], [place], [place] [address]    Special Requests   Final    BOTTLES DRAWN AEROBIC AND ANAEROBIC Blood Culture adequate volume Performed at [facility], [address], [place], [place] [address]    Culture   Final    NO GROWTH 3 DAYS Performed at [facility], [address], [place], [place] [address]    Report Status PENDING  Incomplete  Culture, blood (Routine X 2) w Reflex to ID Panel     Status: None (Preliminary result)   Collection Time: 07/06/23  3:01 PM   Specimen: BLOOD  Result Value Ref Range Status   Specimen Description   Final    BLOOD Performed at [facility], [address] [place], [place] [address]    Special Requests   Final    Immunocompromised Performed at [facility], [address] [place], [place] [address]    Culture   Final    NO GROWTH 3 DAYS Performed at [facility], [address] [place], [place] [address]    Report Status PENDING  Incomplete         Radiology Studies: No results found.       Scheduled Meds:   Continuous Infusions:     LOS: 3 days    Time spent: 52 minutes    Vada Garibaldi, MD [facility]

## 2023-07-09 NOTE — Plan of Care (Signed)

## 2023-07-09 NOTE — TOC Progression Note (Signed)
 Transition of Care [facility]) - Progression Note    Patient Details  Name: [name] MRN: [id number] Date of Birth: March 19, 1940  Transition [facility]) CM/SW Contact  Loreda Rodriguez, RN Phone Number:[phone]  07/09/2023, 11:19 AM  Clinical Narrative:    Cm received message that patient is now comfort care with family requesting [facility] for residential hospice services. Ardine Beckwith liaison for [employer] has confirmed referral via secure chat. TOC will continue to follow.      Barriers to Discharge: Continued Medical Work up  Expected Discharge Plan and Services                                               Social Determinants of Health (SDOH) Interventions SDOH Screenings   Food Insecurity: No Food Insecurity (07/06/2023)  Housing: Low Risk  (07/06/2023)  Transportation Needs: No Transportation Needs (07/06/2023)  Utilities: Not At Risk (07/06/2023)  Depression (PHQ2-9): Medium Risk (11/14/2022)  Social Connections: Socially Isolated (07/06/2023)  Tobacco Use: Medium Risk (07/06/2023)    Readmission Risk Interventions    05/12/2023    2:14 PM  Readmission Risk Prevention Plan  Transportation Screening Complete  PCP or Specialist Appt within 5-7 Days Complete  Home Care Screening Complete  Medication Review (RN CM) Complete

## 2023-07-09 NOTE — Progress Notes (Signed)
 Maryan Smalling [phone] - [facility] Liaison Note   Received request  for family interest in [facility]. Visited patient with sister at bedside and discussed hospice philosophy and inpatient hospice care. Patient is approved for [facility] inpatient hospice care by Dr. Tessie Fila, hospice provider.   Per request of inpatient care team, will assess for moving to [facility] tomorrow.    Please do not hesitate to call with questions.   Madelene Schanz, BSN, RN, [facility] Liaison [phone]

## 2023-07-10 DIAGNOSIS — R5081 Fever presenting with conditions classified elsewhere: Secondary | ICD-10-CM | POA: Diagnosis not present

## 2023-07-10 DIAGNOSIS — D709 Neutropenia, unspecified: Secondary | ICD-10-CM | POA: Diagnosis not present

## 2023-07-10 MED ORDER — POLYVINYL ALCOHOL 1.4 % OP SOLN: 1.0000 [drp] | Freq: Four times a day (QID) | OPHTHALMIC | Status: DC | PRN

## 2023-07-10 MED ORDER — PREDNISOLONE ACETATE 1 % OP SUSP: 1.0000 [drp] | Freq: Four times a day (QID) | OPHTHALMIC | Status: DC

## 2023-07-10 NOTE — Plan of Care (Signed)
  Problem: Education: Goal: Knowledge of General Education information will improve Description: Including pain rating scale, medication(s)/side effects and non-pharmacologic comfort measures Outcome: Completed/Met   Problem: Health Behavior/Discharge Planning: Goal: Ability to manage health-related needs will improve Outcome: Completed/Met   Problem: Clinical Measurements: Goal: Ability to maintain clinical measurements within normal limits will improve Outcome: Completed/Met Goal: Will remain free from infection Outcome: Completed/Met Goal: Diagnostic test results will improve Outcome: Completed/Met Goal: Respiratory complications will improve Outcome: Completed/Met Goal: Cardiovascular complication will be avoided Outcome: Completed/Met   Problem: Activity: Goal: Risk for activity intolerance will decrease Outcome: Completed/Met   Problem: Nutrition: Goal: Adequate nutrition will be maintained Outcome: Completed/Met   Problem: Coping: Goal: Level of anxiety will decrease Outcome: Completed/Met   Problem: Elimination: Goal: Will not experience complications related to bowel motility Outcome: Completed/Met Goal: Will not experience complications related to urinary retention Outcome: Completed/Met   Problem: Pain Managment: Goal: General experience of comfort will improve and/or be controlled Outcome: Completed/Met   Problem: Safety: Goal: Ability to remain free from injury will improve Outcome: Completed/Met   Problem: Skin Integrity: Goal: Risk for impaired skin integrity will decrease Outcome: Completed/Met   Problem: Education: Goal: Knowledge of the prescribed therapeutic regimen will improve Outcome: Completed/Met   Problem: Coping: Goal: Ability to identify and develop effective coping behavior will improve Outcome: Completed/Met   Problem: Clinical Measurements: Goal: Quality of life will improve Outcome: Completed/Met   Problem:  Respiratory: Goal: Verbalizations of increased ease of respirations will increase Outcome: Completed/Met   Problem: Role Relationship: Goal: Family's ability to cope with current situation will improve Outcome: Completed/Met Goal: Ability to verbalize concerns, feelings, and thoughts to partner or family member will improve Outcome: Completed/Met   Problem: Pain Management: Goal: Satisfaction with pain management regimen will improve Outcome: Completed/Met

## 2023-07-10 NOTE — TOC Transition Note (Signed)
 Transition of Care [facility]) - Discharge Note   Patient Details  Name: Makiya Jeune MRN: [id number] Date of Birth: 1940-12-20  [facility] CM/SW Contact:  Loreda Rodriguez, RN Phone Number:[phone]  07/10/2023, 10:22 AM   Clinical Narrative:    CM received notification that patient can come to [facility] . Consents are in place and CM can arrange transport for after 11. Transportation has been arranged per [facility] for after 11am pickup. Nurse has been provided info to call report. Sister Arzella Laurence is at bedside and has been updated on disposition plan. No other TOC needs noted.      Barriers to Discharge: Continued Medical Work up   Patient Goals and CMS Choice            Discharge Placement                       Discharge Plan and Services Additional resources added to the After Visit Summary for                                       Social Drivers of Health (SDOH) Interventions SDOH Screenings   Food Insecurity: No Food Insecurity (07/06/2023)  Housing: Low Risk  (07/06/2023)  Transportation Needs: No Transportation Needs (07/06/2023)  Utilities: Not At Risk (07/06/2023)  Depression (PHQ2-9): Medium Risk (11/14/2022)  Social Connections: Socially Isolated (07/06/2023)  Tobacco Use: Medium Risk (07/06/2023)     Readmission Risk Interventions    05/12/2023    2:14 PM  Readmission Risk Prevention Plan  Transportation Screening Complete  PCP or Specialist Appt within 5-7 Days Complete  Home Care Screening Complete  Medication Review (RN CM) Complete

## 2023-07-10 NOTE — Discharge Summary (Signed)
 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 Patient is on supportive care, blood transfusions and platelet transfusion for her AML. She has exhausted treatment. She was seeing oncologist at [facility] then subsequently transferred back to [facility] just for continuing supportive cares. She was seen at the cancer center with temperature 100.5, ANC 0.1 and hemoglobin 8.5. Patient with chronic poor appetite. She was sent to ER with oncology consultation.  Patient had rapid decline in her health condition and now reaching end-of-life.  Transferring to inpatient hospice to provide end-of-life care.  Neutropenic fever associated with AML, severe neutropenia and pancytopenia: Not improving despite treatment.  Cultures negative. Severe pancytopenia Chronic systolic heart failure Failure to thrive now with confusion.  Patient reaching end-of-life. All symptom control medications available Patient cannot take oral medications as long as she can tolerate.  She can continue to use Synthroid  if desired.  Will discontinue blood pressure medications. Patient has severe inflammatory condition on her left eye that has responded to prednisone  eyedrop and this will be continued. Stable to transfer to inpatient hospice, further medication management and multidisciplinary end-of-life care management as per hospice care.    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)   HTN (hypertension)   Advance care planning   Hypothyroidism    Discharge Instructions  Discharge Instructions     Diet general   Complete by: As directed       Allergies as of 07/10/2023       Reactions   Ambien [zolpidem] Other (See Comments)   "Intolerant"   Codeine Nausea And Vomiting   Dust Mite Extract Cough   Grass Pollen(k-o-r-t-swt Vern) Cough   Hydrocodone Nausea Only   Hydrocodone-acetaminophen  Nausea Only   Statins Other (See Comments)   Muscle pain   Sulfa Antibiotics Nausea And Vomiting   Zofran  [ondansetron ] Anxiety, Other (See Comments)   Increase in anxiety and doesn't help with nausea        Medication List     STOP taking these medications    losartan  50 MG tablet Commonly known as: COZAAR    Magnesium  Oxide 250 MG Tabs   metoprolol  succinate 25 MG 24 hr tablet Commonly known as: TOPROL -XL   polycarbophil 625 MG tablet Commonly known as: FIBERCON   potassium chloride  20 MEQ/15ML (10%) Soln   potassium chloride  SA 20 MEQ tablet Commonly known as: KLOR-CON  M   Zinc  50 MG Tabs       TAKE these medications    ALPRAZolam  0.25 MG tablet Commonly known as: XANAX  TAKE 1 TABLET (0.25 MG TOTAL) BY MOUTH DAILY AS NEEDED. What changed: reasons to take this   artificial tears ophthalmic solution Place 1 drop into both eyes 4 (four) times daily as needed for dry eyes.   feeding supplement Liqd Take 1 Container by mouth See admin instructions. Drink 1 container/bottle of CHOCOLATE BOOST H.P. by mouth 4 times a day   levothyroxine  50  MCG tablet Commonly known as: SYNTHROID  TAKE 1 TABLET (50 MCG TOTAL) BY MOUTH DAILY. What changed: when to take this   Pataday  0.1 % ophthalmic solution Generic drug: olopatadine  Place 1 drop into both eyes 2 (two) times daily as needed for allergies.   prednisoLONE  acetate 1 % ophthalmic suspension Commonly known as: PRED FORTE Place 1 drop into the left eye 4 (four) times daily. What changed:  how to take this when to take this   TYLENOL  500 MG tablet Generic drug: acetaminophen  Take 500-1,000 mg by mouth every 6 (six) hours as needed for fever (or pain or headaches).        Allergies  Allergen Reactions   Ambien [Zolpidem] Other (See Comments)    "Intolerant"   Codeine Nausea And Vomiting   Dust Mite Extract Cough   Grass Pollen(K-O-R-T-Swt Vern) Cough   Hydrocodone Nausea Only   Hydrocodone-Acetaminophen  Nausea Only   Statins Other (See Comments)    Muscle pain   Sulfa Antibiotics Nausea And Vomiting   Zofran  [Ondansetron ] Anxiety and Other (See Comments)    Increase in anxiety and doesn't help with nausea    Consultations: Oncology Palliative Hospice   Procedures/Studies: DG Chest 2 View Result Date: 07/06/2023 CLINICAL DATA:  Neutropenic fevers EXAM: CHEST - 2 VIEW COMPARISON:  Chest x-ray 05/10/2023 skip FINDINGS: Right chest port catheter tip ends in the SVC. The heart size and mediastinal contours are within normal limits. Both lungs are clear. The visualized skeletal structures are unremarkable. IMPRESSION: No active cardiopulmonary disease. Electronically Signed   By: Tyron Gallon M.D.   On: 07/06/2023 18:35   (Echo, Carotid, EGD, Colonoscopy, ERCP)    Subjective: Patient seen and examined.  She is sleepy and lethargic.  Did not wake up for conversation.  Sister at the bedside.  Yesterday evening she was trying to keep up some conversation but she was mispronouncing words and was difficult to speak clearly.  Looks comfortable.   Discharge Exam: Vitals:   07/10/23 0448 07/10/23 1007  BP: 135/66 135/60  Pulse:  63  Resp: 18 16  Temp: 98.2 F (36.8 C) 99.4 F (37.4 C)  SpO2: 98% 98%   Vitals:   07/09/23 0940 07/09/23 1254 07/10/23 0448 07/10/23 1007  BP: (!) 148/57 (!) 142/55 135/66 135/60  Pulse: 60 (!) 120   63  Resp:  16 18 16   Temp:  98 F (36.7 C) 98.2 F (36.8 C) 99.4 F (37.4 C)  TempSrc:  Oral Oral Oral  SpO2:  99% 98% 98%  Weight:      Height:        General: Chronically sick looking.  Frail debilitated.  Currently unable to keep up any conversation.  Lethargic.  Irritable on exam. Cardiovascular: RRR, S1/S2 +, no rubs, no gallops Respiratory: CTA bilaterally, no wheezing, no rhonchi Abdominal: Soft, NT, ND, bowel sounds + Extremities: no edema, no cyanosis Left eye with conjunctival chemosis.    The results of significant diagnostics from this hospitalization (including imaging, microbiology, ancillary and laboratory) are listed below for reference.     Microbiology: Recent Results (from the past 240 hours)  Respiratory (~20 pathogens) panel by PCR     Status: None   Collection Time: 07/06/23  1:44 PM   Specimen: Nasopharyngeal Swab; Respiratory  Result Value Ref Range Status   Adenovirus NOT DETECTED NOT DETECTED Final   Coronavirus 229E NOT DETECTED NOT DETECTED Final    Comment: (NOTE) The Coronavirus on the Respiratory Panel, DOES NOT test  for the novel  Coronavirus (2019 nCoV)    Coronavirus HKU1 NOT DETECTED NOT DETECTED Final   Coronavirus NL63 NOT DETECTED NOT DETECTED Final   Coronavirus OC43 NOT DETECTED NOT DETECTED Final   Metapneumovirus NOT DETECTED NOT DETECTED Final   Rhinovirus / Enterovirus NOT DETECTED NOT DETECTED Final   Influenza A NOT DETECTED NOT DETECTED Final   Influenza B NOT DETECTED NOT DETECTED Final   Parainfluenza Virus 1 NOT DETECTED NOT DETECTED Final   Parainfluenza Virus 2 NOT DETECTED NOT DETECTED Final   Parainfluenza Virus 3 NOT DETECTED NOT DETECTED Final   Parainfluenza Virus 4 NOT DETECTED NOT DETECTED Final   Respiratory Syncytial Virus NOT DETECTED NOT DETECTED Final   Bordetella pertussis NOT DETECTED NOT DETECTED Final   Bordetella Parapertussis NOT DETECTED NOT DETECTED Final   Chlamydophila pneumoniae NOT DETECTED  NOT DETECTED Final   Mycoplasma pneumoniae NOT DETECTED NOT DETECTED Final    Comment: Performed at [facility], [address] [place], [place] [address]  Culture, blood (Routine X 2) w Reflex to ID Panel     Status: None (Preliminary result)   Collection Time: 07/06/23  3:01 PM   Specimen: BLOOD LEFT ARM  Result Value Ref Range Status   Specimen Description   Final    BLOOD LEFT ARM Performed at [facility], [address], [place], [place] [address]    Special Requests   Final    BOTTLES DRAWN AEROBIC AND ANAEROBIC Blood Culture adequate volume Performed at [facility], [address], [place], [place] [address]    Culture   Final    NO GROWTH 4 DAYS Performed at [facility], [address], [place], [place] [address]    Report Status PENDING  Incomplete  Culture, blood (Routine X 2) w Reflex to ID Panel     Status: None (Preliminary result)   Collection Time: 07/06/23  3:01 PM   Specimen: BLOOD  Result Value Ref Range Status   Specimen Description   Final    BLOOD Performed at [facility], [address] [place], [place] [address]    Special Requests   Final    Immunocompromised Performed at [facility], [address], [place], [place] [address]    Culture   Final    NO GROWTH 4 DAYS Performed at [facility], [address] [place], [place] [address]    Report Status PENDING  Incomplete     Labs: BNP (last 3 results) No results for input(s): "BNP" in the last 8760 hours. Basic Metabolic Panel: Recent Labs  Lab 07/06/23 0737 07/06/23 1501 07/07/23 0510 07/08/23 1033  NA 138  --  138 134*  K 3.9  --  4.0 4.3  CL 102  --  105 101  CO2 29  --  24 24  GLUCOSE 117*  --  123* 102*  BUN 29*  --  24* 17  CREATININE 0.76 0.84 0.70 0.69  CALCIUM 9.1  --  8.2* 8.7*  MG 1.9  --   --  1.8   Liver Function Tests: Recent Labs  Lab 07/06/23 0737 07/08/23 1033  AST 32 43*   ALT 35 50*  ALKPHOS 131* 145*  BILITOT 1.3* 1.5*  PROT 7.0 6.4*  ALBUMIN 3.3* 2.5*   No results for input(s): "LIPASE", "AMYLASE" in the last 168 hours. No results for input(s): "AMMONIA" in the last 168 hours. CBC: Recent Labs  Lab 07/06/23 0737 07/06/23 1501 07/07/23 0510 07/08/23 1033 07/09/23 1004  WBC 1.0* 1.5* 1.2* 1.0* 1.0*  NEUTROABS 0.1*  --   --  0.1* 0.1*  HGB 8.5* 9.4* 8.8* 9.4* 9.1*  HCT 26.2* 30.5* 27.2* 29.3* 29.2*  MCV 93.9 96.8 94.8 93.6 96.7  PLT 130* 76* 85* 85* 81*   Cardiac Enzymes: No results for input(s): "CKTOTAL", "CKMB", "CKMBINDEX", "TROPONINI" in the last 168 hours. BNP: Invalid input(s): "POCBNP" CBG: No results for input(s): "GLUCAP" in the last 168 hours. D-Dimer No results for input(s): "DDIMER" in the last 72 hours. Hgb A1c No results for input(s): "HGBA1C" in the last 72 hours. Lipid Profile No results for input(s): "CHOL", "HDL", "LDLCALC", "TRIG", "CHOLHDL", "LDLDIRECT" in the last 72 hours. Thyroid  function studies No results for input(s): "TSH", "T4TOTAL", "T3FREE", "THYROIDAB" in the last 72 hours.  Invalid input(s): "FREET3" Anemia work up No results for input(s): "VITAMINB12", "FOLATE", "FERRITIN", "TIBC", "IRON", "RETICCTPCT" in the last 72 hours. Urinalysis    Component Value Date/Time   COLORURINE YELLOW 07/08/2023 1210   APPEARANCEUR CLEAR 07/08/2023 1210   LABSPEC 1.015 07/08/2023 1210   PHURINE 5.0 07/08/2023 1210   GLUCOSEU NEGATIVE 07/08/2023 1210   GLUCOSEU NEGATIVE 08/08/2022 1618   HGBUR NEGATIVE 07/08/2023 1210   BILIRUBINUR NEGATIVE 07/08/2023 1210   KETONESUR 5 (A) 07/08/2023 1210   PROTEINUR NEGATIVE 07/08/2023 1210   UROBILINOGEN 1.0 08/08/2022 1618   NITRITE NEGATIVE 07/08/2023 1210   LEUKOCYTESUR NEGATIVE 07/08/2023 1210   Sepsis Labs Recent Labs  Lab 07/06/23 1501 07/07/23 0510 07/08/23 1033 07/09/23 1004  WBC 1.5* 1.2* 1.0* 1.0*   Microbiology Recent Results (from the past 240 hours)   Respiratory (~20 pathogens) panel by PCR     Status: None   Collection Time: 07/06/23  1:44 PM   Specimen: Nasopharyngeal Swab; Respiratory  Result Value Ref Range Status   Adenovirus NOT DETECTED NOT DETECTED Final   Coronavirus 229E NOT DETECTED NOT DETECTED Final    Comment: (NOTE) The Coronavirus on the Respiratory Panel, DOES NOT test for the novel  Coronavirus (2019 nCoV)    Coronavirus HKU1 NOT DETECTED NOT DETECTED Final   Coronavirus NL63 NOT DETECTED NOT DETECTED Final   Coronavirus OC43 NOT DETECTED NOT DETECTED Final   Metapneumovirus NOT DETECTED NOT DETECTED Final   Rhinovirus / Enterovirus NOT DETECTED NOT DETECTED Final   Influenza A NOT DETECTED NOT DETECTED Final   Influenza B NOT DETECTED NOT DETECTED Final   Parainfluenza Virus 1 NOT DETECTED NOT DETECTED Final   Parainfluenza Virus 2 NOT DETECTED NOT DETECTED Final   Parainfluenza Virus 3 NOT DETECTED NOT DETECTED Final   Parainfluenza Virus 4 NOT DETECTED NOT DETECTED Final   Respiratory Syncytial Virus NOT DETECTED NOT DETECTED Final   Bordetella pertussis NOT DETECTED NOT DETECTED Final   Bordetella Parapertussis NOT DETECTED NOT DETECTED Final   Chlamydophila pneumoniae NOT DETECTED NOT DETECTED Final   Mycoplasma pneumoniae NOT DETECTED NOT DETECTED Final    Comment: Performed at [facility], [address], [place], [place] [address]  Culture, blood (Routine X 2) w Reflex to ID Panel     Status: None (Preliminary result)   Collection Time: 07/06/23  3:01 PM   Specimen: BLOOD LEFT ARM  Result Value Ref Range Status   Specimen Description   Final    BLOOD LEFT ARM Performed at [facility], [address], [place], [place] [address]    Special Requests   Final  BOTTLES DRAWN AEROBIC AND ANAEROBIC Blood Culture adequate volume Performed at [facility], [address], [place], [place] [address]    Culture   Final    NO GROWTH 4 DAYS Performed at [facility], [address], [place], [place] [address]    Report Status PENDING  Incomplete  Culture, blood (Routine X 2) w Reflex to ID Panel     Status: None (Preliminary result)   Collection Time: 07/06/23  3:01 PM   Specimen: BLOOD  Result Value Ref Range Status   Specimen Description   Final    BLOOD Performed at [facility], [address], [place], [place] [address]    Special Requests   Final    Immunocompromised Performed at [facility], [address], [place], [place] [address]    Culture   Final    NO GROWTH 4 DAYS Performed at [facility], [address] [place], [place] [address]    Report Status PENDING  Incomplete     Time coordinating discharge: 40 minutes  SIGNED:   Vada Garibaldi, MD  [facility] 07/10/2023, 11:03 AM

## 2023-07-10 NOTE — Progress Notes (Signed)
 Report called and given to Cathy,RN

## 2023-07-11 ENCOUNTER — Ambulatory Visit: Payer: Self-pay | Admitting: Family Medicine

## 2023-07-11 LAB — CULTURE, BLOOD (ROUTINE X 2)
Culture: NO GROWTH
Culture: NO GROWTH
Special Requests: ADEQUATE

## 2023-07-12 ENCOUNTER — Inpatient Hospital Stay

## 2023-07-16 ENCOUNTER — Inpatient Hospital Stay

## 2023-07-19 ENCOUNTER — Inpatient Hospital Stay

## 2023-07-23 ENCOUNTER — Inpatient Hospital Stay

## 2023-07-25 ENCOUNTER — Telehealth: Payer: Self-pay | Admitting: Family Medicine

## 2023-07-25 NOTE — Telephone Encounter (Signed)
 Called and LMOVM for patient's sister to give my condolences.  I was glad to see her in clinic.

## 2023-07-26 ENCOUNTER — Inpatient Hospital Stay

## 2023-07-30 ENCOUNTER — Inpatient Hospital Stay

## 2023-08-02 ENCOUNTER — Inpatient Hospital Stay: Admitting: Hematology and Oncology

## 2023-08-02 ENCOUNTER — Inpatient Hospital Stay

## 2023-08-07 DEATH — deceased

## 2023-09-02 IMAGING — MG MM DIGITAL SCREENING BILAT W/ TOMO AND CAD
8 series · 8 of 24 positions shown · non-contrast
Comparison: Previous exam(s).

CLINICAL DATA: Screening.

EXAM:
DIGITAL SCREENING BILATERAL MAMMOGRAM WITH TOMOSYNTHESIS AND CAD
TECHNIQUE: Bilateral screening digital craniocaudal and mediolateral oblique
mammograms were obtained. Bilateral screening digital breast
tomosynthesis was performed. The images were evaluated with
computer-aided detection.

[L MLO synth-2D]
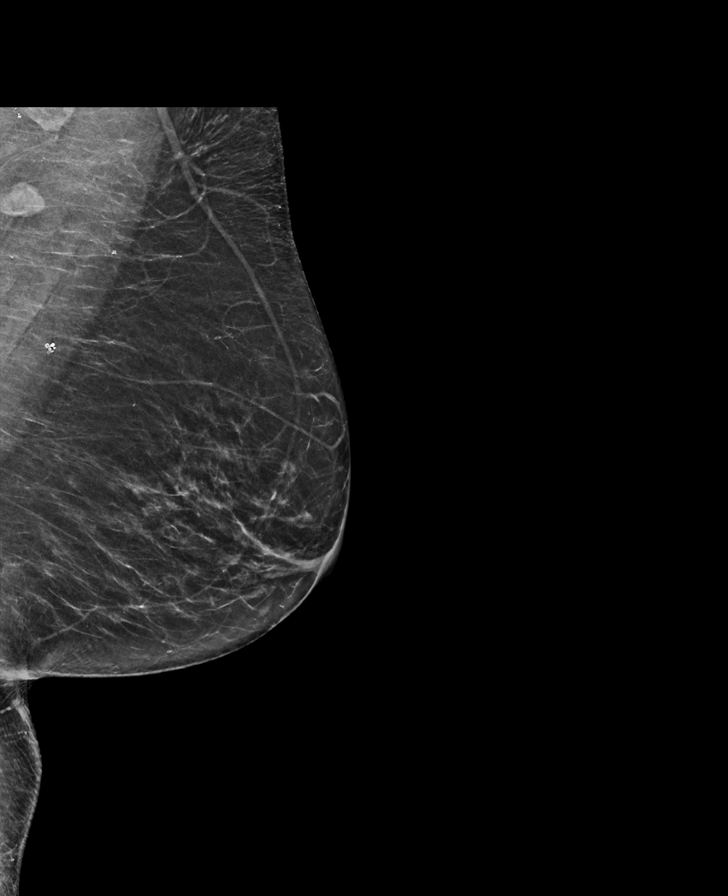

[R CC synth-2D]
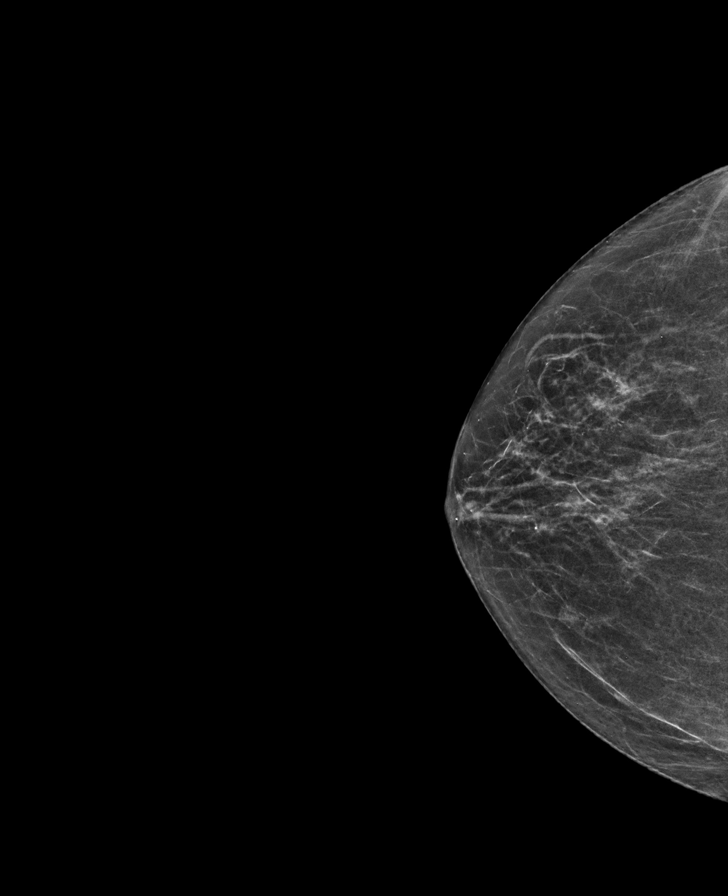

[R MLO synth-2D]
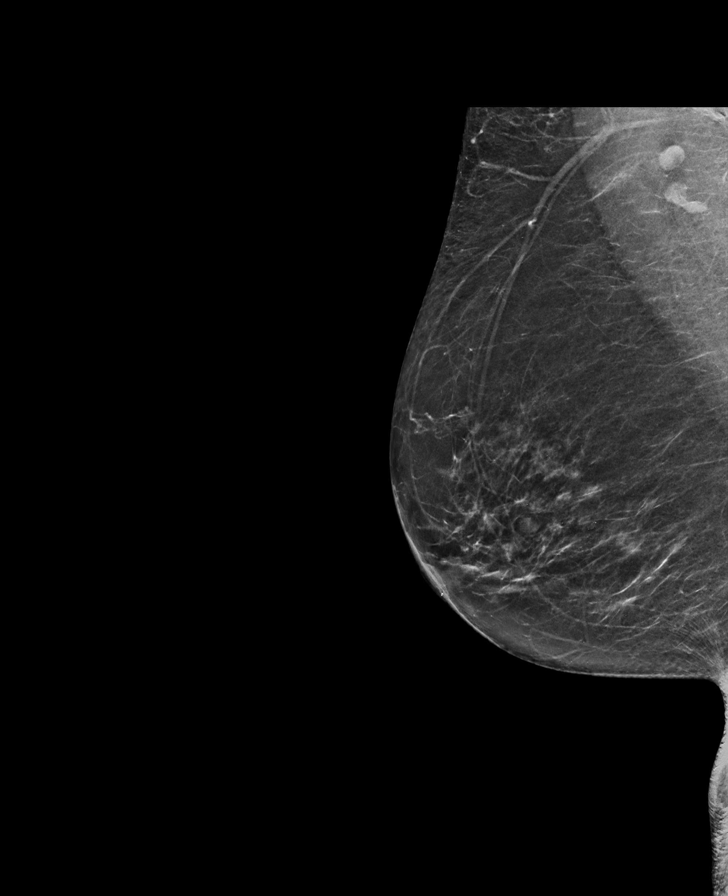

[L CC synth-2D]
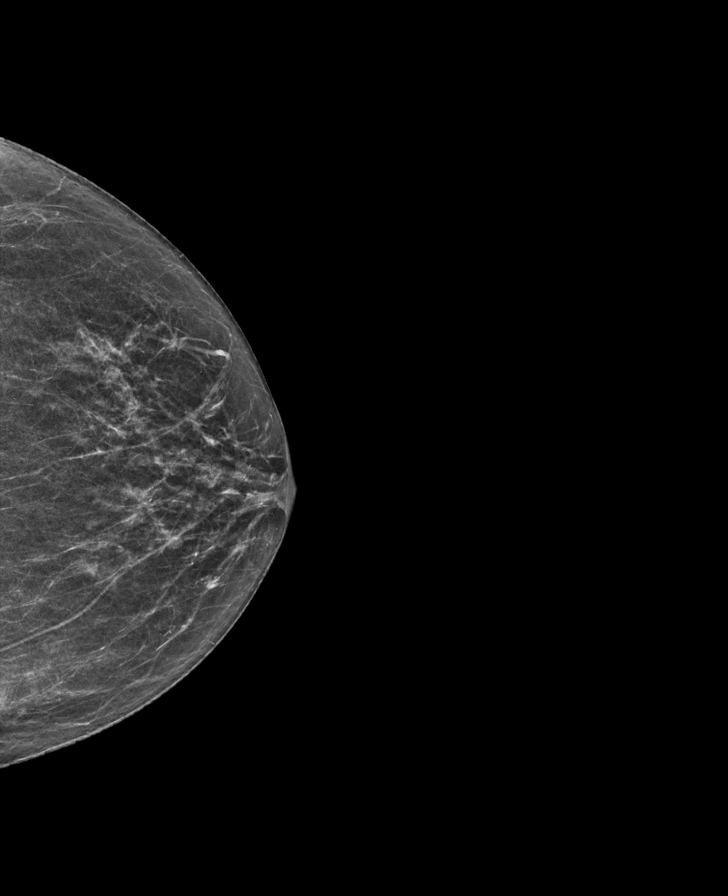

[L MLO tomo · tomo slice 31/61.0]
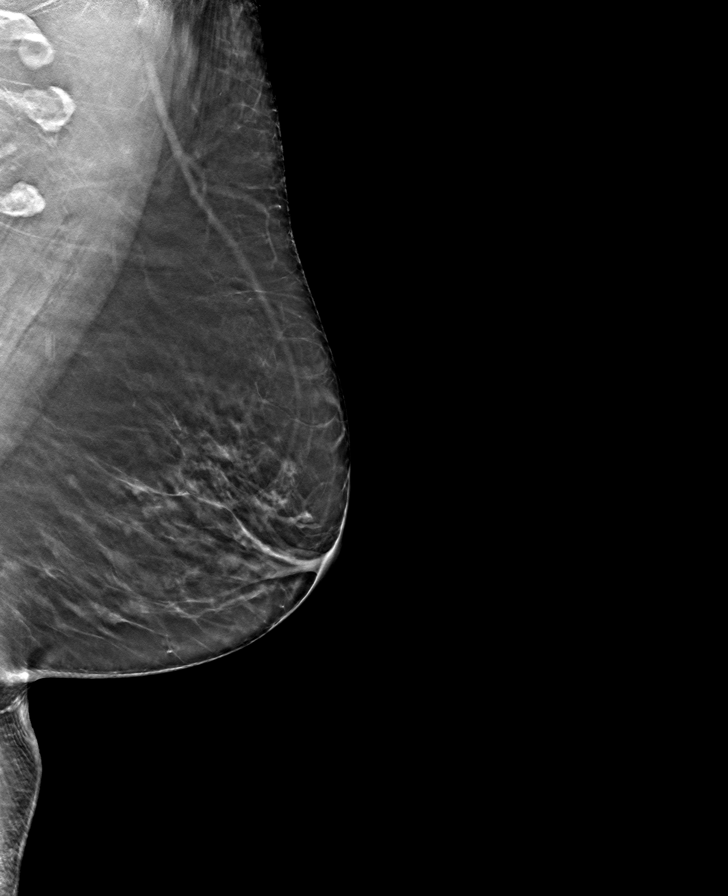

[R CC tomo · tomo slice 27/54.0]
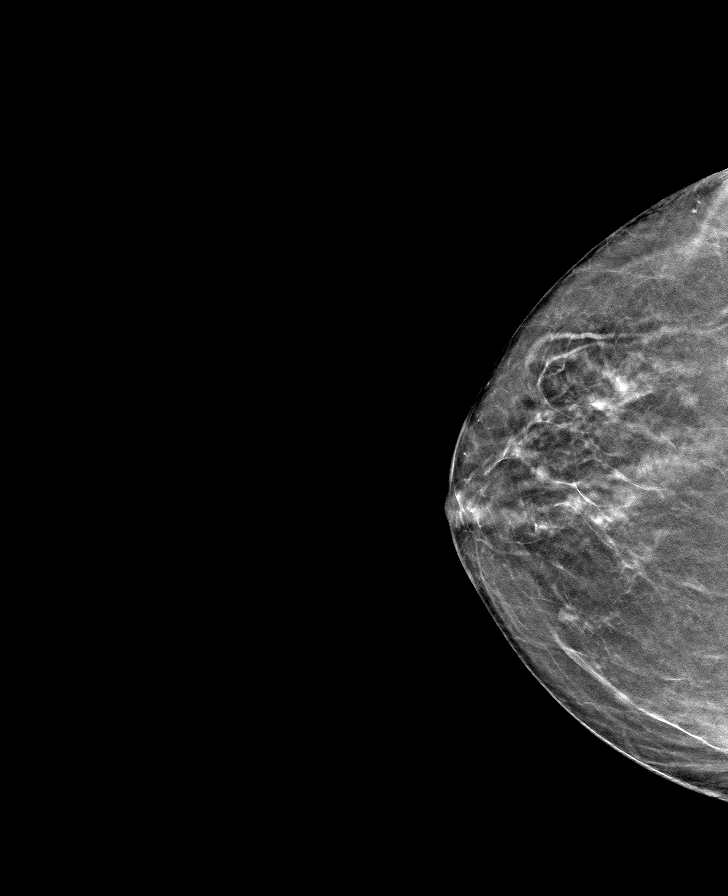

[L CC tomo · tomo slice 27/52.0]
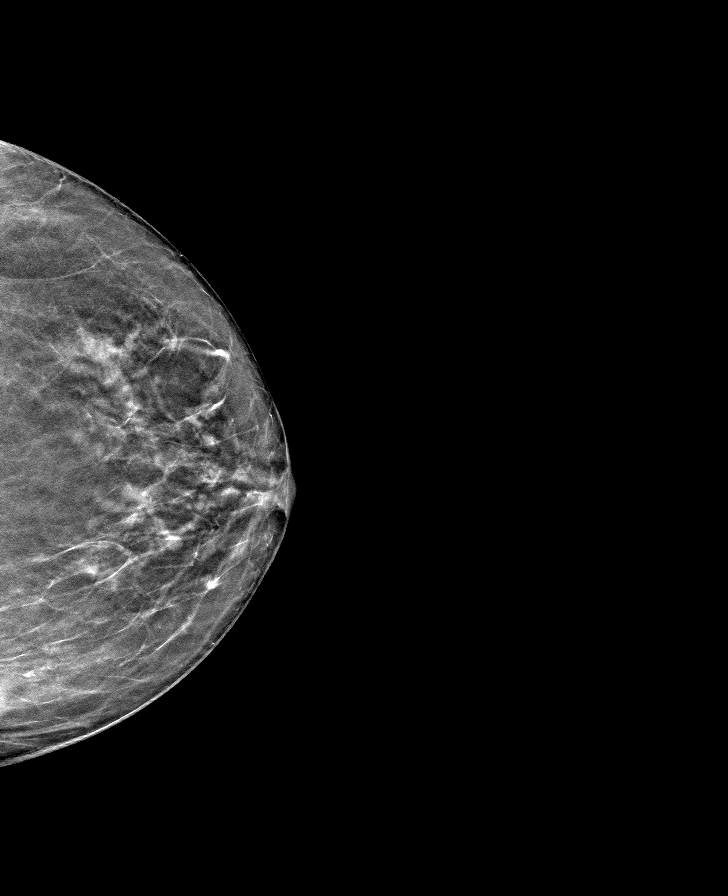

[R MLO tomo · tomo slice 31/61.0]
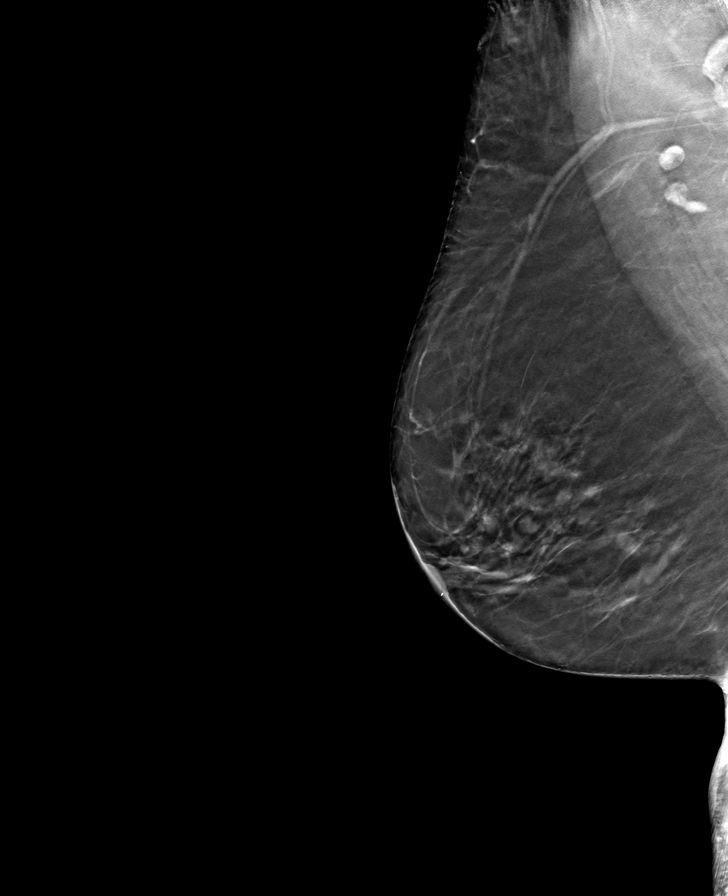

[8 of 24 positions shown; findings below may reference images not displayed]

ACR Breast Density Category b: There are scattered areas of
fibroglandular density.
FINDINGS: There are no findings suspicious for malignancy.
IMPRESSION: No mammographic evidence of malignancy. A result letter of this
screening mammogram will be mailed directly to the patient.

RECOMMENDATION:
Screening mammogram in one year. (Code:51-O-LD2)

BI-RADS CATEGORY  1: Negative.
